# Patient Record
Sex: Female | Born: 1970 | Race: White | Hispanic: No | Smoking: Never smoker
Health system: Southern US, Community
[De-identification: ages and names within clinical notes are randomized; demographics above are authoritative.]

## PROBLEM LIST (undated history)

## (undated) DIAGNOSIS — R51 Headache: Secondary | ICD-10-CM

## (undated) DIAGNOSIS — I871 Compression of vein: Secondary | ICD-10-CM

## (undated) DIAGNOSIS — M5481 Occipital neuralgia: Secondary | ICD-10-CM

## (undated) DIAGNOSIS — E039 Hypothyroidism, unspecified: Secondary | ICD-10-CM

## (undated) DIAGNOSIS — G43909 Migraine, unspecified, not intractable, without status migrainosus: Secondary | ICD-10-CM

## (undated) HISTORY — PX: TUBAL LIGATION: SHX77

## (undated) HISTORY — DX: Compression of vein: I87.1

## (undated) HISTORY — PX: OTHER SURGICAL HISTORY: SHX169

---

## 1999-09-25 ENCOUNTER — Emergency Department (HOSPITAL_COMMUNITY): Admission: EM | Admit: 1999-09-25 | Discharge: 1999-09-25 | Payer: Self-pay | Admitting: Emergency Medicine

## 2000-09-19 ENCOUNTER — Emergency Department (HOSPITAL_COMMUNITY): Admission: EM | Admit: 2000-09-19 | Discharge: 2000-09-19 | Payer: Self-pay | Admitting: Emergency Medicine

## 2002-10-29 ENCOUNTER — Other Ambulatory Visit: Admission: RE | Admit: 2002-10-29 | Discharge: 2002-10-29 | Payer: Self-pay | Admitting: Obstetrics and Gynecology

## 2003-01-12 ENCOUNTER — Ambulatory Visit (HOSPITAL_COMMUNITY): Admission: RE | Admit: 2003-01-12 | Discharge: 2003-01-12 | Payer: Self-pay | Admitting: Obstetrics and Gynecology

## 2003-08-17 ENCOUNTER — Emergency Department (HOSPITAL_COMMUNITY): Admission: EM | Admit: 2003-08-17 | Discharge: 2003-08-17 | Payer: Self-pay | Admitting: Family Medicine

## 2004-05-05 ENCOUNTER — Emergency Department (HOSPITAL_COMMUNITY): Admission: EM | Admit: 2004-05-05 | Discharge: 2004-05-05 | Payer: Self-pay | Admitting: Family Medicine

## 2004-05-10 ENCOUNTER — Emergency Department (HOSPITAL_COMMUNITY): Admission: EM | Admit: 2004-05-10 | Discharge: 2004-05-10 | Payer: Self-pay | Admitting: Emergency Medicine

## 2004-07-06 ENCOUNTER — Other Ambulatory Visit: Admission: RE | Admit: 2004-07-06 | Discharge: 2004-07-06 | Payer: Self-pay | Admitting: Gynecology

## 2004-08-11 ENCOUNTER — Emergency Department (HOSPITAL_COMMUNITY): Admission: EM | Admit: 2004-08-11 | Discharge: 2004-08-11 | Payer: Self-pay | Admitting: Family Medicine

## 2004-09-01 ENCOUNTER — Emergency Department (HOSPITAL_COMMUNITY): Admission: EM | Admit: 2004-09-01 | Discharge: 2004-09-01 | Payer: Self-pay | Admitting: Family Medicine

## 2005-01-05 ENCOUNTER — Encounter (INDEPENDENT_AMBULATORY_CARE_PROVIDER_SITE_OTHER): Payer: Self-pay | Admitting: Specialist

## 2005-01-05 ENCOUNTER — Ambulatory Visit (HOSPITAL_BASED_OUTPATIENT_CLINIC_OR_DEPARTMENT_OTHER): Admission: RE | Admit: 2005-01-05 | Discharge: 2005-01-05 | Payer: Self-pay | Admitting: Plastic Surgery

## 2006-03-28 ENCOUNTER — Emergency Department (HOSPITAL_COMMUNITY): Admission: EM | Admit: 2006-03-28 | Discharge: 2006-03-28 | Payer: Self-pay | Admitting: Family Medicine

## 2006-04-03 ENCOUNTER — Emergency Department (HOSPITAL_COMMUNITY): Admission: EM | Admit: 2006-04-03 | Discharge: 2006-04-03 | Payer: Self-pay | Admitting: Family Medicine

## 2006-09-09 ENCOUNTER — Emergency Department (HOSPITAL_COMMUNITY): Admission: EM | Admit: 2006-09-09 | Discharge: 2006-09-09 | Payer: Self-pay | Admitting: Family Medicine

## 2006-10-18 ENCOUNTER — Ambulatory Visit (HOSPITAL_COMMUNITY): Admission: RE | Admit: 2006-10-18 | Discharge: 2006-10-18 | Payer: Self-pay | Admitting: Pediatrics

## 2007-07-11 ENCOUNTER — Ambulatory Visit (HOSPITAL_COMMUNITY): Admission: RE | Admit: 2007-07-11 | Discharge: 2007-07-11 | Payer: Self-pay | Admitting: Obstetrics and Gynecology

## 2007-07-15 ENCOUNTER — Ambulatory Visit (HOSPITAL_COMMUNITY): Admission: RE | Admit: 2007-07-15 | Discharge: 2007-07-15 | Payer: Self-pay | Admitting: Obstetrics and Gynecology

## 2008-09-21 ENCOUNTER — Emergency Department (HOSPITAL_COMMUNITY): Admission: EM | Admit: 2008-09-21 | Discharge: 2008-09-21 | Payer: Self-pay | Admitting: Family Medicine

## 2009-03-07 ENCOUNTER — Emergency Department (HOSPITAL_COMMUNITY): Admission: EM | Admit: 2009-03-07 | Discharge: 2009-03-08 | Payer: Self-pay | Admitting: Emergency Medicine

## 2009-05-31 ENCOUNTER — Emergency Department (HOSPITAL_COMMUNITY): Admission: EM | Admit: 2009-05-31 | Discharge: 2009-05-31 | Payer: Self-pay | Admitting: Emergency Medicine

## 2010-01-29 ENCOUNTER — Encounter: Payer: Self-pay | Admitting: Obstetrics and Gynecology

## 2010-01-30 ENCOUNTER — Encounter: Payer: Self-pay | Admitting: Pediatrics

## 2010-01-31 ENCOUNTER — Encounter: Payer: Self-pay | Admitting: Obstetrics and Gynecology

## 2010-04-15 LAB — POCT RAPID STREP A (OFFICE): Streptococcus, Group A Screen (Direct): NEGATIVE

## 2010-05-27 NOTE — Op Note (Signed)
NAME:  Martha Aguilar, PRIGMORE NO.:  [id number]   MEDICAL RECORD NO.:  [id number]          PATIENT TYPE:  AMB   LOCATION:  DSC                          FACILITY:  [facility]   PHYSICIAN:  Consuello Bossier., M.D.DATE OF BIRTH:  May 25, 1970   DATE OF PROCEDURE:  01/05/2005  DATE OF DISCHARGE:                                 OPERATIVE REPORT   PREOPERATIVE DIAGNOSIS:  Recently biopsied superficial spreading malignant  melanoma, right medial calf area.   POSTOPERATIVE DIAGNOSIS:  Recently biopsied superficial spreading malignant melanoma, right medial  calf area.   OPERATION:  Wide excisional biopsy with 3 cm wound closure.   SURGEON:  Pleas Patricia, M.D.   ANESTHESIA:  Xylocaine 2% with 1:100,000 epinephrine.   FINDINGS:  The patient had a recent shave biopsy of a superficial malignant  melanoma over her right medial calf for which further wider excisional  biopsy had been advocated.  This was done and the wound closed in layers.   DESCRIPTION OF PROCEDURE:  The patient was brought to the operating room,  marked off with a planned elliptical lesion about the scabbed area from a  recent shave biopsy.  She was prepped with Betadine and iodine and draped  sterilely and anesthetized with Xylocaine 2% with 1:100,000 epinephrine.  An  axial shaped excisional biopsy, which was full thickness including some of  the underlying subcutaneous tissues, was performed.  Bleeding was controlled  with light cautery unit.  The wound was then closed with interrupted  subcutaneous 4-0 Monocryl followed by interrupted and running 5-0 Prolene to  the skin.  Neosporin ointment, 4 by 8s, and an Ace bandage were then  applied.  The patient tolerated the procedure well and was able to be  discharged from the operating room to be followed by me as an outpatient.      Consuello Bossier., M.D.  Electronically Signed     HH/MEDQ  D:  01/05/2005  T:  01/05/2005  Job:  [id number]

## 2010-06-27 ENCOUNTER — Other Ambulatory Visit: Payer: Self-pay | Admitting: Plastic Surgery

## 2011-10-19 ENCOUNTER — Encounter (HOSPITAL_COMMUNITY): Payer: Self-pay | Admitting: *Deleted

## 2011-10-19 ENCOUNTER — Observation Stay (HOSPITAL_COMMUNITY)
Admission: AD | Admit: 2011-10-19 | Discharge: 2011-10-21 | Disposition: A | Discharge status: Home / Self Care | Payer: Managed Care, Other (non HMO) | Source: Ambulatory Visit | Attending: Family Medicine | Admitting: Family Medicine

## 2011-10-19 DIAGNOSIS — Z87440 Personal history of urinary (tract) infections: Secondary | ICD-10-CM | POA: Insufficient documentation

## 2011-10-19 DIAGNOSIS — R109 Unspecified abdominal pain: Secondary | ICD-10-CM | POA: Insufficient documentation

## 2011-10-19 DIAGNOSIS — R102 Pelvic and perineal pain: Secondary | ICD-10-CM | POA: Diagnosis present

## 2011-10-19 DIAGNOSIS — G43909 Migraine, unspecified, not intractable, without status migrainosus: Secondary | ICD-10-CM | POA: Insufficient documentation

## 2011-10-19 DIAGNOSIS — E860 Dehydration: Secondary | ICD-10-CM | POA: Diagnosis present

## 2011-10-19 DIAGNOSIS — R112 Nausea with vomiting, unspecified: Secondary | ICD-10-CM | POA: Diagnosis present

## 2011-10-19 DIAGNOSIS — E039 Hypothyroidism, unspecified: Secondary | ICD-10-CM | POA: Diagnosis present

## 2011-10-19 DIAGNOSIS — G43709 Chronic migraine without aura, not intractable, without status migrainosus: Secondary | ICD-10-CM | POA: Insufficient documentation

## 2011-10-19 DIAGNOSIS — Z79899 Other long term (current) drug therapy: Secondary | ICD-10-CM | POA: Insufficient documentation

## 2011-10-19 DIAGNOSIS — N390 Urinary tract infection, site not specified: Principal | ICD-10-CM | POA: Diagnosis present

## 2011-10-19 DIAGNOSIS — N179 Acute kidney failure, unspecified: Secondary | ICD-10-CM | POA: Diagnosis present

## 2011-10-19 DIAGNOSIS — R1024 Suprapubic pain: Secondary | ICD-10-CM | POA: Diagnosis present

## 2011-10-19 HISTORY — DX: Hypothyroidism, unspecified: E03.9

## 2011-10-19 HISTORY — DX: Dehydration: E86.0

## 2011-10-19 HISTORY — DX: Headache: R51

## 2011-10-19 LAB — COMPREHENSIVE METABOLIC PANEL
ALT: 8 U/L (ref 0–35)
AST: 10 U/L (ref 0–37)
Alkaline Phosphatase: 51 U/L (ref 39–117)
CO2: 28 mEq/L (ref 19–32)
Chloride: 102 mEq/L (ref 96–112)
Creatinine, Ser: 3.95 mg/dL — ABNORMAL HIGH (ref 0.50–1.10)
GFR calc non Af Amer: 13 mL/min — ABNORMAL LOW (ref >90)
Potassium: 4 mEq/L (ref 3.5–5.1)
Total Bilirubin: 0.3 mg/dL (ref 0.3–1.2)

## 2011-10-19 LAB — URINE MICROSCOPIC-ADD ON

## 2011-10-19 LAB — DIFFERENTIAL
Eosinophils Relative: 1 % (ref 0–5)
Lymphocytes Relative: 17 % (ref 12–46)
Lymphs Abs: 1.3 10*3/uL (ref 0.7–4.0)
Monocytes Absolute: 0.9 10*3/uL (ref 0.1–1.0)
Monocytes Relative: 12 % (ref 3–12)
Neutro Abs: 5.5 10*3/uL (ref 1.7–7.7)

## 2011-10-19 LAB — URINALYSIS, ROUTINE W REFLEX MICROSCOPIC
Bilirubin Urine: NEGATIVE
Glucose, UA: NEGATIVE mg/dL
Ketones, ur: NEGATIVE mg/dL
Specific Gravity, Urine: 1.009 (ref 1.005–1.030)
pH: 5 (ref 5.0–8.0)

## 2011-10-19 LAB — CBC
MCV: 92.9 fL (ref 78.0–100.0)
Platelets: 233 10*3/uL (ref 150–400)
RBC: 4.09 MIL/uL (ref 3.87–5.11)
WBC: 7.8 10*3/uL (ref 4.0–10.5)

## 2011-10-19 LAB — PHOSPHORUS: Phosphorus: 3.6 mg/dL (ref 2.3–4.6)

## 2011-10-19 MED ORDER — ZOLPIDEM TARTRATE 5 MG PO TABS
5.0000 mg | ORAL_TABLET | Freq: Once | ORAL | Status: AC
  Administered 2011-10-19: 5 mg via ORAL
  Filled 2011-10-19: qty 1

## 2011-10-19 MED ORDER — PHENAZOPYRIDINE HCL 100 MG PO TABS
100.0000 mg | ORAL_TABLET | Freq: Three times a day (TID) | ORAL | Status: DC
  Filled 2011-10-19: qty 1

## 2011-10-19 MED ORDER — SODIUM CHLORIDE 0.9 % IV SOLN
INTRAVENOUS | Status: AC
  Administered 2011-10-19: 19:00:00 via INTRAVENOUS

## 2011-10-19 MED ORDER — ONDANSETRON HCL 4 MG/2ML IJ SOLN: 4.0000 mg | Freq: Four times a day (QID) | INTRAMUSCULAR | Status: DC | PRN

## 2011-10-19 MED ORDER — ENOXAPARIN SODIUM 40 MG/0.4ML ~~LOC~~ SOLN
40.0000 mg | SUBCUTANEOUS | Status: DC
  Filled 2011-10-19 (×2): qty 0.4

## 2011-10-19 MED ORDER — OXYCODONE HCL 5 MG PO TABS
5.0000 mg | ORAL_TABLET | Freq: Four times a day (QID) | ORAL | Status: DC | PRN
  Administered 2011-10-19 – 2011-10-21 (×4): 5 mg via ORAL
  Filled 2011-10-19 (×4): qty 1

## 2011-10-19 MED ORDER — ONDANSETRON HCL 4 MG PO TABS: 4.0000 mg | ORAL_TABLET | Freq: Four times a day (QID) | ORAL | Status: DC | PRN

## 2011-10-19 MED ORDER — LEVOTHYROXINE SODIUM 100 MCG PO TABS
100.0000 ug | ORAL_TABLET | Freq: Every day | ORAL | Status: DC
  Administered 2011-10-20 – 2011-10-21 (×2): 100 ug via ORAL
  Filled 2011-10-19 (×4): qty 1

## 2011-10-19 MED ORDER — DEXTROSE 5 % IV SOLN
2.0000 g | INTRAVENOUS | Status: DC
  Administered 2011-10-19 – 2011-10-20 (×2): 2 g via INTRAVENOUS
  Filled 2011-10-19 (×3): qty 2

## 2011-10-19 MED ORDER — PHENAZOPYRIDINE HCL 100 MG PO TABS
100.0000 mg | ORAL_TABLET | Freq: Three times a day (TID) | ORAL | Status: DC
  Administered 2011-10-19 – 2011-10-21 (×6): 100 mg via ORAL
  Filled 2011-10-19 (×11): qty 1

## 2011-10-19 MED ORDER — ACETAMINOPHEN 325 MG PO TABS
650.0000 mg | ORAL_TABLET | Freq: Four times a day (QID) | ORAL | Status: DC | PRN
  Filled 2011-10-19: qty 2

## 2011-10-19 MED ORDER — ACETAMINOPHEN 650 MG RE SUPP: 650.0000 mg | Freq: Four times a day (QID) | RECTAL | Status: DC | PRN

## 2011-10-19 NOTE — H&P (Signed)
[facility] History and Physical  Martha Aguilar:[id number] DOB: Feb 27, 1970 DOA: 10/19/2011  Referring physician: Claude Manges  PCP: Claude Manges, FNP   Chief Complaint: Abdominal pain, nausea/vomiting, unable to keep things down.  HPI: Martha Aguilar is a 41 y.o. female with a past medical history significant for hypothyroidism, migraines and history of recurrent UTIs; admitted from the patient's primary care physician office secondary to ongoing abdominal pain (suprapubic region), dysuria, low-grade fever/chills, nausea/vomiting and inability to keep things down. Patient was diagnosed about 4-5 days ago with what appeared to be at that moment a non-complicated UTI, initially started on antibiotics by mouth without successful clearance or improvement of her symptoms. At this moment patient was admitted for fluid resuscitation since she is dehydrated and to provide further treatment for pyelonephritis. Patient denies chest pain, hematemesis, melena, hematuria, headaches, blurred vision or any other acute complaints.   Review of Systems:  Negative except as otherwise mentioned on history of present illness.  Past Medical History  Diagnosis Date  . Headache     migraines  . Hypothyroidism    Past Surgical History  Procedure Date  . Uterine polyps    Social History:  reports that she has never smoked. She has never used smokeless tobacco. She reports that she drinks alcohol. She reports that she does not use illicit drugs. patient admitted from home, at this moment do not require any assistance with activities of daily living.  Allergies: No known allergic reactions to any medications.  Family history: Reviewed with patient and mother at bedside no significant past medical history reported.  Prior to Admission medications   Not on File   Physical Exam:   General:  No acute distress, afebrile, cooperative to examination.  Eyes: No icterus, PERRLA, extra ocular  muscles intact; no nystagmus  ENT: Dry mucous membranes, no erythema or exudate inside patient's mouth, no discharges out of her nostrils or ears  Neck: No thyromegaly, no JVD, no bruits; neck was supple on exam  Cardiovascular: S1 and S2 appreciated, no murmur, no gallops or rubs.  Respiratory: Clear to auscultation bilaterally  Abdomen: Soft, positive bowel sounds, no guarding, mild to moderate suprapubic discomfort aspirin across lower abdomen with deep palpation.  Skin: No rash or petechiae, no open wounds.  Musculoskeletal: No joint swelling or erythema, full range of motion.  Psychiatric: Stable, appropriate, no suicidal ideation or hallucinations.  Neurologic: Cranial nerves grossly intact, patient alert, awake and oriented x3, no focal motor or sensory deficit at present on exam; normal finger to nose.  Labs on Admission:  Labwork order but pending at the moment of the dictation.  Assessment/Plan 1-pyelonephritis: Patient with symptoms of low-grade fever, lower back pain, dysuria, nausea/vomiting and inability to keep things down most likely secondary to UTI. At this point she has failed antibiotic therapy as an outpatient and is unable to keep things down for further treatment.  -Will admit the patient to regular floor, provide supportive care and fluid resuscitation -repeat urine culture -Will use empiric Rocephin and also the use of Pyridium for dysuria -When necessary antiemetics and also antipyretics.   2-Nausea & vomiting: Most likely as a sitter with problem #1. Will provide fluid resuscitation and as mentioned above antiemetics as needed.   3-Suprapubic abdominal pain: As a sitter with problem #1. Will provide when necessary pain medications. Empirical treatment for Rocephin to be initiated once urine culture has been taken.  4-Dehydration: Secondary to decrease by mouth intake do to ongoing nausea and vomiting,  we'll provide IV fluid  resuscitation.  5-Hypothyroidism:  We'll check TSH and continue Synthroid. Depending TSH levels Synthroid will be adjusted.   6-Migraines: Stable and not present at this point. Plan is to continue pristiq also PRN Imitrex. Pharmacy to review and reconcile patient's home doses in order for [name] to continue same regimen.  7-Recurrent UTI: Second episode of UTI in the last 6 months. Patient at this point he might qualify for suppressive therapy once his acute treatment is finished.   DVT: Lovenox.   Code Status: Full Family Communication: patient and mother at bedside Disposition Plan: home when medically stable; hopefully in 1-2 days  Time spent: >30 minutes  Rondal Vandevelde [facility] Pager [phone]  If 7PM-7AM, please contact night-coverage [email or web address] Password [email or web address] 10/19/2011, 6:25 PM

## 2011-10-20 DIAGNOSIS — N179 Acute kidney failure, unspecified: Secondary | ICD-10-CM | POA: Diagnosis present

## 2011-10-20 LAB — CBC
MCH: 30.9 pg (ref 26.0–34.0)
MCV: 93.3 fL (ref 78.0–100.0)
Platelets: 223 10*3/uL (ref 150–400)
RBC: 3.75 MIL/uL — ABNORMAL LOW (ref 3.87–5.11)
RDW: 11.8 % (ref 11.5–15.5)
WBC: 9.6 10*3/uL (ref 4.0–10.5)

## 2011-10-20 LAB — BASIC METABOLIC PANEL
CO2: 24 mEq/L (ref 19–32)
Calcium: 7.9 mg/dL — ABNORMAL LOW (ref 8.4–10.5)
Creatinine, Ser: 3.27 mg/dL — ABNORMAL HIGH (ref 0.50–1.10)
GFR calc non Af Amer: 16 mL/min — ABNORMAL LOW (ref >90)
Glucose, Bld: 120 mg/dL — ABNORMAL HIGH (ref 70–99)
Sodium: 137 mEq/L (ref 135–145)

## 2011-10-20 MED ORDER — SODIUM CHLORIDE 0.9 % IV SOLN
INTRAVENOUS | Status: DC
  Administered 2011-10-20 – 2011-10-21 (×3): via INTRAVENOUS

## 2011-10-20 MED ORDER — VENLAFAXINE HCL ER 150 MG PO CP24
150.0000 mg | ORAL_CAPSULE | Freq: Every day | ORAL | Status: DC
  Administered 2011-10-20: 150 mg via ORAL
  Filled 2011-10-20 (×2): qty 1

## 2011-10-20 MED ORDER — MORPHINE SULFATE 2 MG/ML IJ SOLN
INTRAMUSCULAR | Status: AC
  Filled 2011-10-20: qty 1

## 2011-10-20 MED ORDER — MORPHINE SULFATE 2 MG/ML IJ SOLN
1.0000 mg | INTRAMUSCULAR | Status: DC | PRN
  Administered 2011-10-20: 1 mg via INTRAVENOUS

## 2011-10-20 MED ORDER — HYDROMORPHONE HCL PF 1 MG/ML IJ SOLN
1.0000 mg | INTRAMUSCULAR | Status: DC | PRN
  Administered 2011-10-20 – 2011-10-21 (×6): 1 mg via INTRAVENOUS
  Filled 2011-10-20 (×6): qty 1

## 2011-10-20 MED ORDER — ENOXAPARIN SODIUM 30 MG/0.3ML ~~LOC~~ SOLN
30.0000 mg | SUBCUTANEOUS | Status: DC
  Administered 2011-10-20: 30 mg via SUBCUTANEOUS
  Filled 2011-10-20 (×2): qty 0.3

## 2011-10-20 NOTE — Progress Notes (Signed)
Utilization review completed.  

## 2011-10-20 NOTE — Progress Notes (Signed)
[facility] PROGRESS NOTE  MANVIR THORSON WUX:[id number] DOB: May 17, 1970 DOA: 10/19/2011 PCP: No primary provider on file.  Brief narrative: 41 year old female with history of hypothyroidism, migraine headaches and recurrent UTI's presented with suprapubic abdominal pain associated with fevers and dysuria.  Assessment and Plan:  Principal Problem:  *Suprapubic abdominal pain, nausea and vomiting  Secondary to UTI  We will continue Rocephin   Follow up urine culture results  Continue supportive care with NS @ 125 cc/hr  Continue zofran prn nausea and/or vomiting  Active Problems: Acute kidney injury  Secondary to UTI  No previous renal function values available for comparison   Creatinine slowly trending down  Continue IV fluids  [facility] [phone]  Consultants:  None   Procedures/Studies:  No results found.  Antibiotics:  Rocephin 10/19/2011 -->  Code Status: Full Family Communication: Pt at bedside Disposition Plan: Home when medically stable  HPI/Subjective: No events overnight.   Objective: Filed Vitals:   10/19/11 1900 10/19/11 2050 10/19/11 2122 10/20/11 0531  BP:   127/76 93/54  Pulse:   77 77  Temp:   97.7 F (36.5 C) 97.9 F (36.6 C)  TempSrc:   Oral Oral  Resp:   18 18  Height: 5\' 6"  (1.676 m) 5\' 6"  (1.676 m)    Weight: 63.504 kg (140 lb) 63.504 kg (140 lb)    SpO2:   98% 95%    Intake/Output Summary (Last 24 hours) at 10/20/11 0941 Last data filed at 10/20/11 0700  Gross per 24 hour  Intake 1831.67 ml  Output    850 ml  Net 981.67 ml    Exam:   General:  Pt is alert, follows commands appropriately, not in acute distress  Cardiovascular: Regular rate and rhythm, S1/S2, no murmurs, no rubs, no gallops  Respiratory: Clear to auscultation bilaterally, no wheezing, no crackles, no rhonchi  Abdomen: Soft, non tender, non distended, bowel sounds present, no guarding  Extremities: No edema, pulses DP and PT  palpable bilaterally  Neuro: Grossly nonfocal  Data Reviewed: Basic Metabolic Panel:  Lab 10/20/11 0347 10/19/11 1904  NA 137 138  K 4.0 4.0  CL 105 102  CO2 24 28  GLUCOSE 120* 88  BUN 31* 34*  CREATININE 3.27* 3.95*  CALCIUM 7.9* 8.9  MG -- 1.9  PHOS -- 3.6   Liver Function Tests:  Lab 10/19/11 1904  AST 10  ALT 8  ALKPHOS 51  BILITOT 0.3  PROT 6.4  ALBUMIN 3.4*   CBC:  Lab 10/20/11 0440 10/19/11 1904  WBC 9.6 7.8  HGB 11.6* 13.0  HCT 35.0* 38.0  MCV 93.3 92.9  PLT 223 233    Scheduled Meds:   . cefTRIAXone   2 g Intravenous Q24H  . enoxaparin (LOVENOX)   30 mg Subcutaneous Q24H  . levothyroxine  100 mcg Oral QAC breakfast  . phenazopyridine  100 mg Oral TID WC  . zolpidem  5 mg Oral Once   Continuous Infusions:   . sodium chloride 125 mL/hr at 10/19/11 1840     Debbora Presto, MD  [facility] Pager [phone]  If 7PM-7AM, please contact night-coverage [email or web address] Password [email or web address] 10/20/2011, 9:41 AM   LOS: 1 day

## 2011-10-20 NOTE — Progress Notes (Signed)
INITIAL ADULT NUTRITION ASSESSMENT Date: 10/20/2011   Time: 3:06 PM Reason for Assessment: Nutrition risk   INTERVENTION: Anti-emetics per MD. Will monitor.   ASSESSMENT: Female 41 y.o.  Dx: Suprapubic abdominal pain   Food/Nutrition Related Hx: Pt recently diagnosed with a UTI 4-5 days ago and admitted with abdominal pain, nausea/vomiting, and unable to keep things down. Attempted to meet with pt, however pt stated she did not want to talk right now, but was able state that her nausea was improving. Pt reported 10 pound unintended weight loss since June of this year and poor appetite on admission screen.   Hx:  Past Medical History  Diagnosis Date  . Headache     migraines  . Hypothyroidism    Related Meds:  Scheduled Meds:   . cefTRIAXone (ROCEPHIN)  IV  2 g Intravenous Q24H  . enoxaparin (LOVENOX) injection  30 mg Subcutaneous Q24H  . levothyroxine  100 mcg Oral QAC breakfast  . morphine      . phenazopyridine  100 mg Oral TID WC  . zolpidem  5 mg Oral Once  . DISCONTD: enoxaparin (LOVENOX) injection  40 mg Subcutaneous Q24H  . DISCONTD: phenazopyridine  100 mg Oral TID WC   Continuous Infusions:   . sodium chloride 125 mL/hr at 10/19/11 1840  . sodium chloride 125 mL/hr at 10/20/11 1106   PRN Meds:.acetaminophen, acetaminophen, HYDROmorphone (DILAUDID) injection, morphine injection, ondansetron (ZOFRAN) IV, ondansetron, oxyCODONE  Ht: 5\' 6"  (167.6 cm)  Wt: 140 lb (63.504 kg)  Ideal Wt: 130 lb % Ideal Wt: 107  Usual Wt: 150 lb in June 2013 per pt report % Usual Wt: 93   Body mass index is 22.60 kg/(m^2).  Labs:  CMP     Component Value Date/Time   NA 137 10/20/2011 0440   K 4.0 10/20/2011 0440   CL 105 10/20/2011 0440   CO2 24 10/20/2011 0440   GLUCOSE 120* 10/20/2011 0440   BUN 31* 10/20/2011 0440   CREATININE 3.27* 10/20/2011 0440   CALCIUM 7.9* 10/20/2011 0440   PROT 6.4 10/19/2011 1904   ALBUMIN 3.4* 10/19/2011 1904   AST 10 10/19/2011 1904     ALT 8 10/19/2011 1904   ALKPHOS 51 10/19/2011 1904   BILITOT 0.3 10/19/2011 1904   GFRNONAA 16* 10/20/2011 0440   GFRAA 19* 10/20/2011 0440    Intake/Output Summary (Last 24 hours) at 10/20/11 1514 Last data filed at 10/20/11 1300  Gross per 24 hour  Intake 2071.67 ml  Output   1350 ml  Net 721.67 ml   Last BM - 10/9  Diet Order: General   IVF:    sodium chloride Last Rate: 125 mL/hr at 10/19/11 1840  sodium chloride Last Rate: 125 mL/hr at 10/20/11 1106    Estimated Nutritional Needs:   Kcal:1650-1900 Protein:65-75g Fluid:1.6-1.9L  NUTRITION DIAGNOSIS: -Predicted suboptimal energy intake (NI-1.6).  Status: Ongoing  RELATED TO: nausea/abdominal pain  AS EVIDENCE BY: H&P  MONITORING/EVALUATION(Goals): 1. Resolution of nausea/abdominal pain 2. Pt to consume >90% of meals  EDUCATION NEEDS: -No education needs identified at this time   Dietitian [phone]  DOCUMENTATION CODES Per approved criteria  -Not Applicable    Marshall Cork 10/20/2011, 3:06 PM

## 2011-10-21 ENCOUNTER — Observation Stay (HOSPITAL_COMMUNITY): Payer: Managed Care, Other (non HMO)

## 2011-10-21 DIAGNOSIS — R109 Unspecified abdominal pain: Secondary | ICD-10-CM

## 2011-10-21 DIAGNOSIS — N179 Acute kidney failure, unspecified: Secondary | ICD-10-CM

## 2011-10-21 LAB — CBC
HCT: 35.2 % — ABNORMAL LOW (ref 36.0–46.0)
Hemoglobin: 11.7 g/dL — ABNORMAL LOW (ref 12.0–15.0)
MCH: 31 pg (ref 26.0–34.0)
MCHC: 33.2 g/dL (ref 30.0–36.0)
RDW: 11.8 % (ref 11.5–15.5)

## 2011-10-21 LAB — URINE CULTURE

## 2011-10-21 LAB — BASIC METABOLIC PANEL
BUN: 17 mg/dL (ref 6–23)
Calcium: 8.1 mg/dL — ABNORMAL LOW (ref 8.4–10.5)
Creatinine, Ser: 1.89 mg/dL — ABNORMAL HIGH (ref 0.50–1.10)
GFR calc Af Amer: 36 mL/min — ABNORMAL LOW (ref >90)
GFR calc non Af Amer: 31 mL/min — ABNORMAL LOW (ref >90)
Glucose, Bld: 98 mg/dL (ref 70–99)

## 2011-10-21 MED ORDER — HYDROCODONE-ACETAMINOPHEN 5-500 MG PO TABS: 1.0000 | ORAL_TABLET | Freq: Four times a day (QID) | ORAL | Status: DC | PRN

## 2011-10-21 MED ORDER — LORAZEPAM 2 MG/ML IJ SOLN
INTRAMUSCULAR | Status: AC
  Filled 2011-10-21: qty 1

## 2011-10-21 NOTE — Progress Notes (Signed)
Per pt. Request we perform bladder scan; which shows ~224ml of urine in bladder.  Pt. C/o persistent feeling of her "bladder feels real full; and it takes a long time to empty whenever I void".  She requests, and we perform I & O cath., which returns of orange (she is on Pyridium) urine.  She thanks Martha Aguilar for our care.

## 2011-10-21 NOTE — Discharge Summary (Signed)
Physician Discharge Summary  DONNARAE SATTERFIELD ZOX:[id number] DOB: [birth year] DOA: 10/19/2011  PCP: No primary provider on file.  Admit date: 10/19/2011 Discharge date: 10/21/2011  Recommendations for Outpatient Follow-up:  1. Follow up BMP in one week  Discharge Diagnoses:  Principal Problem:  *Suprapubic abdominal pain Active Problems:  Nausea & vomiting  Dehydration  Hypothyroidism  Migraines  Acute kidney injury   Discharge Condition: Stable  Diet recommendation: regular diet  Filed Weights   10/19/11 1900 10/19/11 2050  Weight: 63.504 kg (140 lb) 63.504 kg (140 lb)    History of present illness:  Martha Aguilar is a 41 y.o. female with a past medical history significant for hypothyroidism, migraines and history of recurrent UTIs; admitted from the patient's primary care physician office secondary to ongoing abdominal pain (suprapubic region), dysuria, low-grade fever/chills, nausea/vomiting and inability to keep things down. Patient was diagnosed about 4-5 days ago with what appeared to be at that moment a non-complicated UTI, initially started on antibiotics by mouth without successful clearance or improvement of her symptoms. At this moment patient was admitted for fluid resuscitation since she is dehydrated and to provide further treatment for pyelonephritis. Patient denies chest pain, hematemesis, melena, hematuria, headaches, blurred vision or any other acute complaints   Hospital Course:   UTI Patient has h/o recurrent UTI's, and she was started on rocephin empirically. The urine culture showed no growth. At this time will discharge home. She does not require antibiotics. Patient had normal WBC during the hospital stay.  AKI Patient had Cr 3.95 on the day of admission, she was started on iv fluids and at this time the Cr is down to 1.89. She was taking NSAIDs at home for migraines, and i have stopped them . I have also advised her not to take NSAIDs. Renal ultrasound  showed no hydronephrosis.she will need repeat BMP in one week. And if cr is still elevated she will need nephrology follow up.  Migraines Continue sumatriptan  Dehydration:  Resolved after IV fluids.  Nausea/Vomiting Resolved, patient eating without any problems.    Procedures:  Renal Ultrasound  IMPRESSION:  Normal size in appearance of both kidneys. No evidence of  hydronephrosis.  Consultations:  None  Discharge Exam: Filed Vitals:   10/20/11 1413 10/20/11 2200 10/21/11 0619 10/21/11 1502  BP: 104/56 122/85 97/63 128/67  Pulse: 73 73 73 75  Temp: 97.6 F (36.4 C) 98.2 F (36.8 C) 97.9 F (36.6 C) 97.5 F (36.4 C)  TempSrc: Oral Oral Oral Oral  Resp: 20 16 18 18   Height:      Weight:      SpO2: 100% 96% 94% 98%    General: Appears in no acute distress Cardiovascular: S1S2 RRR Respiratory: clear to auscultation bilaterally Abdomen: Soft, nontender to palpation  Discharge Instructions  Discharge Orders    Future Orders Please Complete By Expires   Diet - low sodium heart healthy      Increase activity slowly          Medication List     As of 10/21/2011  4:39 PM    STOP taking these medications         ibuprofen 200 MG tablet   Commonly known as: ADVIL,MOTRIN      levofloxacin 500 MG tablet   Commonly known as: LEVAQUIN      TAKE these medications         desvenlafaxine 100 MG 24 hr tablet   Commonly known as: PRISTIQ   Take  100 mg by mouth daily.      fish oil-omega-3 fatty acids 1000 MG capsule   Take 1 g by mouth daily.      HYDROcodone-acetaminophen 5-500 MG per tablet   Commonly known as: VICODIN   Take 1 tablet by mouth every 6 (six) hours as needed for pain.      levothyroxine 175 MCG tablet   Commonly known as: SYNTHROID, LEVOTHROID   Take 175 mcg by mouth daily.      SUMAtriptan 100 MG tablet   Commonly known as: IMITREX   Take 100 mg by mouth every 2 (two) hours as needed. For migraines.      SUMAtriptan 6 MG/0.5ML  Soln injection   Commonly known as: IMITREX   Inject 6 mg into the skin every 2 (two) hours as needed. For migraines.      vitamin C 500 MG tablet   Commonly known as: ASCORBIC ACID   Take 500 mg by mouth daily.      zolpidem 10 MG tablet   Commonly known as: AMBIEN   Take 10 mg by mouth at bedtime as needed. For sleep.          The results of significant diagnostics from this hospitalization (including imaging, microbiology, ancillary and laboratory) are listed below for reference.    Significant Diagnostic Studies: US Renal  10/21/2011  *RADIOLOGY REPORT*  Clinical Data:  Acute renal failure.  Nausea and vomiting.  RENAL/URINARY TRACT ULTRASOUND COMPLETE  C  IMPRESSION: Normal size in appearance of both kidneys.  No evidence of hydronephrosis.   Original Report Authenticated By: Danae Orleans, M.D.     Microbiology: Recent Results (from the past 240 hour(s))  URINE CULTURE     Status: Normal   Collection Time   10/19/11  6:32 PM      Component Value Range Status Comment   Specimen Description URINE, CLEAN CATCH   Final    Special Requests NONE   Final    Culture  Setup Time 10/20/2011 05:22   Final    Colony Count MG   Final    Culture NO GROWTH   Final    Report Status 10/21/2011 FINAL   Final      Labs: Basic Metabolic Panel:  Lab 10/21/11 4098 10/20/11 0440 10/19/11 1904  NA 140 137 138  K 3.8 4.0 4.0  CL 107 105 102  CO2 26 24 28   GLUCOSE 98 120* 88  BUN 17 31* 34*  CREATININE 1.89* 3.27* 3.95*  CALCIUM 8.1* 7.9* 8.9  MG -- -- 1.9  PHOS -- -- 3.6   Liver Function Tests:  Lab 10/19/11 1904  AST 10  ALT 8  ALKPHOS 51  BILITOT 0.3  PROT 6.4  ALBUMIN 3.4*   No results found for this basename: LIPASE:5,AMYLASE:5 in the last 168 hours No results found for this basename: AMMONIA:5 in the last 168 hours CBC:  Lab 10/21/11 0529 10/20/11 0440 10/19/11 1904  WBC 7.9 9.6 7.8  NEUTROABS -- -- 5.5  HGB 11.7* 11.6* 13.0  HCT 35.2* 35.0* 38.0  MCV 93.1  93.3 92.9  PLT 213 223 233    Time coordinating discharge: 45 mines  Signed:  LAMA,GAGAN S  [facility] 10/21/2011, 4:39 PM

## 2011-10-21 NOTE — Progress Notes (Signed)
She had attempted unsuccessfully to void ~30 min. Prior to d/c, about which she was very concerned.  However, she has just successfully voided, about which she was pleased and is confident to go home.  She verbalizes understanding of her d/c instructions, including the need to see her PCP in 1 week to have repeat blood work to check her renal function (she is a Engineer, civil (consulting)).  After receiving prescriptions and instructions, for which she signs, she is taken to her car without incident.  She chooses not to access her electronic chart; and I reiterate that she has several days to complete this if she wishes at home.

## 2011-10-21 NOTE — Progress Notes (Signed)
[facility] PROGRESS NOTE  Martha Aguilar:[id number] DOB: 1970/05/26 DOA: 10/19/2011 PCP: No primary provider on file.  Brief narrative: 41 year old female with history of hypothyroidism, migraine headaches and recurrent UTI's presented with suprapubic abdominal pain associated with fevers and dysuria.  Assessment and Plan:  Principal Problem:  *Suprapubic abdominal pain, nausea and vomiting  Secondary to UTI  We will continue Rocephin   Urine culture is negative so far  Continue supportive care with NS @ 100 ml/hr  Continue zofran prn nausea and/or vomiting  Active Problems: Acute kidney injury  Secondary to UTI  No previous renal function values available for comparison   Creatinine slowly trending down  Continue IV fluids  Will get renal ultrasound  1  Consultants:  None   Procedures/Studies: No results found.  Antibiotics:  Rocephin 10/19/2011 -->  Code Status: Full Family Communication: Pt at bedside Disposition Plan: Home when medically stable  HPI/Subjective: Patient says she still has some nausea and gets sharp suprapubic pain.denies vomiting.  Objective: Filed Vitals:   10/20/11 0531 10/20/11 1413 10/20/11 2200 10/21/11 0619  BP: 93/54 104/56 122/85 97/63  Pulse: 77 73 73 73  Temp: 97.9 F (36.6 C) 97.6 F (36.4 C) 98.2 F (36.8 C) 97.9 F (36.6 C)  TempSrc: Oral Oral Oral Oral  Resp: 18 20 16 18   Height:      Weight:      SpO2: 95% 100% 96% 94%    Intake/Output Summary (Last 24 hours) at 10/21/11 0949 Last data filed at 10/21/11 0600  Gross per 24 hour  Intake 2412.5 ml  Output    500 ml  Net 1912.5 ml    Exam:   General:  Pt is alert, follows commands appropriately, not in acute distress  Cardiovascular: Regular rate and rhythm, S1/S2, no murmurs, no rubs, no gallops  Respiratory: Clear to auscultation bilaterally, no wheezing, no crackles, no rhonchi  Abdomen: Soft, non tender, non distended, bowel  sounds present, no guarding  Extremities: No edema, pulses DP and PT palpable bilaterally  Neuro: Grossly nonfocal  Data Reviewed: Basic Metabolic Panel:  Lab 10/21/11 8119 10/20/11 0440 10/19/11 1904  NA 140 137 138  K 3.8 4.0 4.0  CL 107 105 102  CO2 26 24 28   GLUCOSE 98 120* 88  BUN 17 31* 34*  CREATININE 1.89* 3.27* 3.95*  CALCIUM 8.1* 7.9* 8.9  MG -- -- 1.9  PHOS -- -- 3.6   Liver Function Tests:  Lab 10/19/11 1904  AST 10  ALT 8  ALKPHOS 51  BILITOT 0.3  PROT 6.4  ALBUMIN 3.4*   CBC:  Lab 10/20/11 0440 10/19/11 1904  WBC 9.6 7.8  HGB 11.6* 13.0  HCT 35.0* 38.0  MCV 93.3 92.9  PLT 223 233    Scheduled Meds:   . cefTRIAXone   2 g Intravenous Q24H  . enoxaparin (LOVENOX)   30 mg Subcutaneous Q24H  . levothyroxine  100 mcg Oral QAC breakfast  . phenazopyridine  100 mg Oral TID WC  . zolpidem  5 mg Oral Once   Continuous Infusions:    . sodium chloride 125 mL/hr at 10/21/11 0510     Meredeth Ide, MD  [facility] Pager [phone]  If 7PM-7AM, please contact night-coverage [email or web address] Password [email or web address] 10/21/2011, 9:49 AM   LOS: 2 days

## 2011-10-31 ENCOUNTER — Ambulatory Visit
Admission: RE | Admit: 2011-10-31 | Discharge: 2011-10-31 | Disposition: A | Discharge status: Home / Self Care | Payer: Managed Care, Other (non HMO) | Source: Ambulatory Visit | Attending: Family Medicine | Admitting: Family Medicine

## 2011-10-31 ENCOUNTER — Other Ambulatory Visit: Payer: Self-pay | Admitting: Family Medicine

## 2011-10-31 DIAGNOSIS — M545 Low back pain, unspecified: Secondary | ICD-10-CM

## 2011-11-09 ENCOUNTER — Encounter (HOSPITAL_COMMUNITY): Payer: Self-pay

## 2012-04-01 ENCOUNTER — Other Ambulatory Visit: Payer: Self-pay | Admitting: Obstetrics and Gynecology

## 2012-10-04 ENCOUNTER — Encounter (HOSPITAL_COMMUNITY): Payer: Self-pay | Admitting: Adult Health

## 2012-10-04 ENCOUNTER — Emergency Department (HOSPITAL_COMMUNITY)
Admission: EM | Admit: 2012-10-04 | Discharge: 2012-10-05 | Disposition: A | Discharge status: Home / Self Care | Payer: Managed Care, Other (non HMO) | Attending: Emergency Medicine | Admitting: Emergency Medicine

## 2012-10-04 DIAGNOSIS — G43009 Migraine without aura, not intractable, without status migrainosus: Secondary | ICD-10-CM | POA: Insufficient documentation

## 2012-10-04 DIAGNOSIS — E039 Hypothyroidism, unspecified: Secondary | ICD-10-CM | POA: Insufficient documentation

## 2012-10-04 DIAGNOSIS — Z79899 Other long term (current) drug therapy: Secondary | ICD-10-CM | POA: Insufficient documentation

## 2012-10-04 MED ORDER — DIPHENHYDRAMINE HCL 50 MG/ML IJ SOLN
25.0000 mg | Freq: Once | INTRAMUSCULAR | Status: AC
  Administered 2012-10-04: 25 mg via INTRAVENOUS
  Filled 2012-10-04: qty 1

## 2012-10-04 MED ORDER — HYDROMORPHONE HCL PF 1 MG/ML IJ SOLN
1.0000 mg | Freq: Once | INTRAMUSCULAR | Status: AC
  Administered 2012-10-04: 1 mg via INTRAVENOUS
  Filled 2012-10-04: qty 1

## 2012-10-04 MED ORDER — PROMETHAZINE HCL 25 MG/ML IJ SOLN
25.0000 mg | Freq: Once | INTRAMUSCULAR | Status: AC
  Administered 2012-10-04: 25 mg via INTRAVENOUS
  Filled 2012-10-04: qty 1

## 2012-10-04 MED ORDER — SODIUM CHLORIDE 0.9 % IV BOLUS (SEPSIS)
1000.0000 mL | Freq: Once | INTRAVENOUS | Status: AC
  Administered 2012-10-04: 1000 mL via INTRAVENOUS

## 2012-10-04 NOTE — ED Provider Notes (Signed)
CSN: [id number]     Arrival date & time 10/04/12  2055 History  This chart was scribed for non-physician practitioner working with Gwyneth Sprout, MD by Ronal Fear, ED scribe. This patient was seen in room TR10C/TR10C and the patient's care was started at 10:06 PM.    Chief Complaint  Patient presents with  . Migraine    Patient is a 42 y.o. female presenting with migraines. The history is provided by the patient. No language interpreter was used.  Migraine This is a chronic problem. The current episode started 1 to 2 hours ago. The problem occurs every several days. The problem has not changed since onset.Associated symptoms include headaches. Nothing aggravates the symptoms. Nothing relieves the symptoms.  pt states that the pain feels like pressure in the back of her head. When she has similar occurences they are usually upon awakening. This particular occurences began gradually while she was in the kitchen since Wednesday at 3pm. pt took Imitrex pills and 2 injections for the migraines with no relief.  Headache is similar to prior headaches that she normallly has, except not improved with usual treatment.  Sts she usually has migraine headache twice weekly and has it since she was 16.  Has been cared for by neurologist, has had botox injection.  Denies fever, rash, trauma, numbness or weakness.  Does endorse light/sound sensitivity.  Denies having auras.  Past Medical History  Diagnosis Date  . Headache(784.0)     migraines  . Hypothyroidism    Past Surgical History  Procedure Laterality Date  . Uterine polyps     History reviewed. No pertinent family history. History  Substance Use Topics  . Smoking status: Never Smoker   . Smokeless tobacco: Never Used  . Alcohol Use: Yes     Comment: social   OB History   Grav Para Term Preterm Abortions TAB SAB Ect Mult Living                 Review of Systems  Constitutional: Negative for fever and chills.  HENT: Negative for neck  pain and neck stiffness.   Gastrointestinal: Negative for nausea and vomiting.  Skin: Negative for rash.  Neurological: Positive for headaches.    Allergies  Review of patient's allergies indicates no known allergies.  Home Medications   Current Outpatient Rx  Name  Route  Sig  Dispense  Refill  . desvenlafaxine (PRISTIQ) 100 MG 24 hr tablet   Oral   Take 100 mg by mouth daily.         Marland Kitchen gabapentin (NEURONTIN) 600 MG tablet   Oral   Take 600 mg by mouth at bedtime.         Marland Kitchen levothyroxine (SYNTHROID, LEVOTHROID) 175 MCG tablet   Oral   Take 175 mcg by mouth daily.         . SUMAtriptan (IMITREX) 100 MG tablet   Oral   Take 100 mg by mouth every 2 (two) hours as needed. For migraines.         . SUMAtriptan (IMITREX) 6 MG/0.5ML SOLN injection   Subcutaneous   Inject 6 mg into the skin every 2 (two) hours as needed. For migraines.         Marland Kitchen tiZANidine (ZANAFLEX) 4 MG tablet   Oral   Take 4 mg by mouth every 6 (six) hours as needed (spasms).         . zolpidem (AMBIEN) 10 MG tablet   Oral   Take 10  mg by mouth at bedtime as needed. For sleep.         Marland Kitchen zonisamide (ZONEGRAN) 100 MG capsule   Oral   Take 300 mg by mouth at bedtime.          BP 95/40  Pulse 78  Temp(Src) 98.6 F (37 C) (Oral)  Resp 16  SpO2 100% Physical Exam  Nursing note and vitals reviewed. Constitutional: She is oriented to person, place, and time. She appears well-developed and well-nourished. No distress.  HENT:  Head: Normocephalic and atraumatic.  Eyes: EOM are normal.  Neck: Neck supple. No tracheal deviation present.  Cardiovascular: Normal rate.   Pulmonary/Chest: Effort normal. No respiratory distress.  Musculoskeletal: Normal range of motion.  Neurological: She is alert and oriented to person, place, and time.  No focal neuro deficit or facial droop. No sudden onset thunderclap headache  Skin: Skin is warm and dry.  Psychiatric: She has a normal mood and affect.  Her behavior is normal.    ED Course  Procedures (including critical care time)  DIAGNOSTIC STUDIES: Oxygen Saturation is 100% on RA, normal by my interpretation.    COORDINATION OF CARE: 10:11 PM- Pt advised of plan for treatment including narcotics for pain and pt agrees.  Headache similar to previous, no fever, neck stiffness, neuro findings or new symptoms to suggest more serious etiology.  I don't think SAH, ICH, meningitis, encephalitis, mass at this time.  No recent trauma.  I don't feel imaging necessary at this time.  Plan to control symptoms.   11:48 PM Pt report improvement of sxs from migraine cocktail.  Agrees to f/u with neurologist for further management.  Pt stable for discharge   Labs Review Labs Reviewed - No data to display Imaging Review No results found.  MDM   1. Migraine headache without aura    BP 95/40  Pulse 78  Temp(Src) 98.6 F (37 C) (Oral)  Resp 16  SpO2 100%  I have reviewed nursing notes and vital signs.  I reviewed available ER/hospitalization records thought the EMR   I personally performed the services described in this documentation, which was scribed in my presence. The recorded information has been reviewed and is accurate.     Fayrene Helper, PA-C 10/04/12 2349

## 2012-10-04 NOTE — ED Notes (Addendum)
Presents with migraine since Weds, denies vomiting, mild nausea. Associated with light and sound sensitivity. "feels like a migraine"  Pt is alert and oriented, MAEx4, neurologically intact.

## 2012-10-05 NOTE — ED Provider Notes (Signed)
Medical screening examination/treatment/procedure(s) were performed by non-physician practitioner and as supervising physician I was immediately available for consultation/collaboration.   Gwyneth Sprout, MD 10/05/12 1407

## 2012-12-12 ENCOUNTER — Other Ambulatory Visit (HOSPITAL_COMMUNITY): Payer: Self-pay | Admitting: Pulmonary Disease

## 2012-12-12 DIAGNOSIS — R109 Unspecified abdominal pain: Secondary | ICD-10-CM

## 2012-12-17 ENCOUNTER — Ambulatory Visit (HOSPITAL_COMMUNITY)
Admission: RE | Admit: 2012-12-17 | Discharge: 2012-12-17 | Disposition: A | Discharge status: Home / Self Care | Payer: Managed Care, Other (non HMO) | Source: Ambulatory Visit | Attending: Pulmonary Disease | Admitting: Pulmonary Disease

## 2012-12-17 DIAGNOSIS — R109 Unspecified abdominal pain: Secondary | ICD-10-CM | POA: Insufficient documentation

## 2012-12-20 ENCOUNTER — Ambulatory Visit (INDEPENDENT_AMBULATORY_CARE_PROVIDER_SITE_OTHER): Payer: Self-pay | Admitting: Surgery

## 2012-12-25 ENCOUNTER — Other Ambulatory Visit (HOSPITAL_COMMUNITY): Payer: Self-pay | Admitting: Pulmonary Disease

## 2012-12-25 DIAGNOSIS — R110 Nausea: Secondary | ICD-10-CM

## 2012-12-25 DIAGNOSIS — R109 Unspecified abdominal pain: Secondary | ICD-10-CM

## 2012-12-30 ENCOUNTER — Encounter (HOSPITAL_COMMUNITY)
Admission: RE | Admit: 2012-12-30 | Discharge: 2012-12-30 | Disposition: A | Discharge status: Home / Self Care | Payer: Managed Care, Other (non HMO) | Source: Ambulatory Visit | Attending: Pulmonary Disease | Admitting: Pulmonary Disease

## 2012-12-30 ENCOUNTER — Encounter (HOSPITAL_COMMUNITY): Payer: Self-pay

## 2012-12-30 DIAGNOSIS — R109 Unspecified abdominal pain: Secondary | ICD-10-CM

## 2012-12-30 DIAGNOSIS — R110 Nausea: Secondary | ICD-10-CM | POA: Insufficient documentation

## 2012-12-30 DIAGNOSIS — R52 Pain, unspecified: Secondary | ICD-10-CM | POA: Insufficient documentation

## 2012-12-30 MED ORDER — STERILE WATER FOR INJECTION IJ SOLN
INTRAMUSCULAR | Status: AC
  Administered 2012-12-30: 1.27 mL via INTRAVENOUS
  Filled 2012-12-30: qty 10

## 2012-12-30 MED ORDER — TECHNETIUM TC 99M MEBROFENIN IV KIT
5.0000 | PACK | Freq: Once | INTRAVENOUS | Status: AC | PRN
  Administered 2012-12-30: 5 via INTRAVENOUS

## 2012-12-30 MED ORDER — SINCALIDE 5 MCG IJ SOLR
INTRAMUSCULAR | Status: AC
  Administered 2012-12-30: 1.27 ug via INTRAVENOUS
  Filled 2012-12-30: qty 5

## 2013-01-23 ENCOUNTER — Other Ambulatory Visit: Payer: Self-pay | Admitting: Gastroenterology

## 2013-01-23 DIAGNOSIS — R1013 Epigastric pain: Secondary | ICD-10-CM

## 2013-01-28 ENCOUNTER — Ambulatory Visit
Admission: RE | Admit: 2013-01-28 | Discharge: 2013-01-28 | Disposition: A | Discharge status: Home / Self Care | Payer: Managed Care, Other (non HMO) | Source: Ambulatory Visit | Attending: Gastroenterology | Admitting: Gastroenterology

## 2013-01-28 ENCOUNTER — Other Ambulatory Visit: Payer: Self-pay | Admitting: Gastroenterology

## 2013-01-28 ENCOUNTER — Inpatient Hospital Stay
Admission: RE | Admit: 2013-01-28 | Discharge: 2013-01-28 | Disposition: A | Discharge status: Home / Self Care | Payer: Managed Care, Other (non HMO) | Source: Ambulatory Visit | Attending: Gastroenterology | Admitting: Gastroenterology

## 2013-01-28 DIAGNOSIS — R1013 Epigastric pain: Secondary | ICD-10-CM

## 2013-01-28 MED ORDER — IOHEXOL 300 MG/ML  SOLN
100.0000 mL | Freq: Once | INTRAMUSCULAR | Status: AC | PRN
  Administered 2013-01-28: 100 mL via INTRAVENOUS

## 2013-01-29 ENCOUNTER — Other Ambulatory Visit (HOSPITAL_COMMUNITY): Payer: Self-pay | Admitting: Gastroenterology

## 2013-01-29 DIAGNOSIS — R1013 Epigastric pain: Secondary | ICD-10-CM

## 2013-02-05 ENCOUNTER — Ambulatory Visit (HOSPITAL_COMMUNITY): Admission: RE | Admit: 2013-02-05 | Payer: Managed Care, Other (non HMO) | Source: Ambulatory Visit

## 2013-03-13 ENCOUNTER — Encounter (INDEPENDENT_AMBULATORY_CARE_PROVIDER_SITE_OTHER): Payer: Self-pay | Admitting: *Deleted

## 2013-04-03 ENCOUNTER — Ambulatory Visit (INDEPENDENT_AMBULATORY_CARE_PROVIDER_SITE_OTHER): Payer: Managed Care, Other (non HMO) | Admitting: Internal Medicine

## 2013-06-03 ENCOUNTER — Telehealth (INDEPENDENT_AMBULATORY_CARE_PROVIDER_SITE_OTHER): Payer: Self-pay | Admitting: *Deleted

## 2013-06-03 NOTE — Telephone Encounter (Signed)
LM on 06/03/13 at 9:44 am to return the call at [phone].

## 2013-06-03 NOTE — Telephone Encounter (Signed)
Phone call here Friday 05/30/13 at 3:45  She was to had an appt with Terri back late March early April but she did not keep it due to a migraine and her stomach problems seemed to get better.  It is bothering her again and wants to know can she reschedule or should she get another referral from Dr. Luan Pulling.  Please  Call back  [phone]

## 2013-06-03 NOTE — Telephone Encounter (Signed)
Apt has been scheduled for 07/03/13 at 10:45 am with Deberah Castle, NP.

## 2013-07-03 ENCOUNTER — Encounter (INDEPENDENT_AMBULATORY_CARE_PROVIDER_SITE_OTHER): Payer: Self-pay | Admitting: Internal Medicine

## 2013-07-03 ENCOUNTER — Ambulatory Visit (INDEPENDENT_AMBULATORY_CARE_PROVIDER_SITE_OTHER): Payer: Managed Care, Other (non HMO) | Admitting: Internal Medicine

## 2013-07-03 ENCOUNTER — Encounter (INDEPENDENT_AMBULATORY_CARE_PROVIDER_SITE_OTHER): Payer: Self-pay | Admitting: *Deleted

## 2013-07-03 VITALS — BP 90/70 | HR 76 | Temp 97.6°F | Ht 68.0 in | Wt 140.6 lb

## 2013-07-03 DIAGNOSIS — R1013 Epigastric pain: Secondary | ICD-10-CM

## 2013-07-03 LAB — CBC WITH DIFFERENTIAL/PLATELET
BASOS ABS: 0 10*3/uL (ref 0.0–0.1)
Basophils Relative: 0 % (ref 0–1)
Eosinophils Absolute: 0.1 10*3/uL (ref 0.0–0.7)
Eosinophils Relative: 1 % (ref 0–5)
HCT: 43.1 % (ref 36.0–46.0)
Hemoglobin: 14.6 g/dL (ref 12.0–15.0)
LYMPHS PCT: 16 % (ref 12–46)
Lymphs Abs: 1.3 10*3/uL (ref 0.7–4.0)
MCH: 31.5 pg (ref 26.0–34.0)
MCHC: 33.9 g/dL (ref 30.0–36.0)
MCV: 92.9 fL (ref 78.0–100.0)
Monocytes Absolute: 0.5 10*3/uL (ref 0.1–1.0)
Monocytes Relative: 6 % (ref 3–12)
NEUTROS ABS: 6.4 10*3/uL (ref 1.7–7.7)
Neutrophils Relative %: 77 % (ref 43–77)
PLATELETS: 308 10*3/uL (ref 150–400)
RBC: 4.64 MIL/uL (ref 3.87–5.11)
RDW: 13.6 % (ref 11.5–15.5)
WBC: 8.3 10*3/uL (ref 4.0–10.5)

## 2013-07-03 LAB — HEPATIC FUNCTION PANEL
ALBUMIN: 4.9 g/dL (ref 3.5–5.2)
ALT: 16 U/L (ref 0–35)
AST: 18 U/L (ref 0–37)
Alkaline Phosphatase: 54 U/L (ref 39–117)
BILIRUBIN TOTAL: 0.7 mg/dL (ref 0.2–1.2)
Bilirubin, Direct: 0.2 mg/dL (ref 0.0–0.3)
Indirect Bilirubin: 0.5 mg/dL (ref 0.2–1.2)
Total Protein: 7.1 g/dL (ref 6.0–8.3)

## 2013-07-03 NOTE — Progress Notes (Signed)
Subjective:     Patient ID: Martha Aguilar, female   DOB: 1970-03-06, 43 y.o.   MRN: [id number]  HPI  Referred to our office by Dr. Luan Pulling for abdominal pain. She has epigastric pain. She has seen Dr. Oletta Lamas (GI) and underwent and EGD (January, 2015)  was negative. She cancelled the gastric emptying study.  She has a lot of gas. She has a lot of flatus.   She says it feels like a baby in her epigastric region. Today she tells me she has not had this pain for 3 weeks.  When the pain occurs, it remains x 24 hrs and is constant. She will lay on a heating pad.  She does take Miralax for chronic constipation. She says Miralax really does not help her BMs. She usually has a BM every 2-3 days. Appetite is good. No weight loss.  When she has the pain it is not related to eating.  No rectal bleeding melena.   01/23/2013 Pathology report: Normal small bowel mucosa. Negative for features of gluten (sprue). No Giardia.  01/28/2013 CT abdomen/pelvis with CM: epigastric pain:  IMPRESSION:  Unremarkable study.   12/25/2012 HIDA scan: Abdominal pain, nausea:  1. No acute findings.  2. Patent cystic duct without evidence for acute cholecystitis.    Review of Systems Past Medical History  Diagnosis Date  . Headache(784.0)     migraines  . Hypothyroidism     Past Surgical History  Procedure Laterality Date  . Uterine polyps      No Known Allergies  Current Outpatient Prescriptions on File Prior to Visit  Medication Sig Dispense Refill  . desvenlafaxine (PRISTIQ) 100 MG 24 hr tablet Take 100 mg by mouth daily.      Marland Kitchen levothyroxine (SYNTHROID, LEVOTHROID) 175 MCG tablet Take 175 mcg by mouth daily.      . SUMAtriptan (IMITREX) 100 MG tablet Take 100 mg by mouth every 2 (two) hours as needed. For migraines.      . SUMAtriptan (IMITREX) 6 MG/0.5ML SOLN injection Inject 6 mg into the skin every 2 (two) hours as needed. For migraines.      Marland Kitchen tiZANidine (ZANAFLEX) 4 MG tablet Take 4 mg by mouth  every 6 (six) hours as needed (spasms).      . zolpidem (AMBIEN) 10 MG tablet Take 10 mg by mouth at bedtime as needed. For sleep.       No current facility-administered medications on file prior to visit.        Objective:   Physical Exam  Filed Vitals:   07/03/13 1113  BP: 90/70  Pulse: 76  Temp: 97.6 F (36.4 C)  Height: 5\' 8"  (1.727 m)  Weight: 140 lb 9.6 oz (63.776 kg)   Alert and oriented. Skin warm and dry. Oral mucosa is moist.   . Sclera anicteric, conjunctivae is pink. Thyroid not enlarged. No cervical lymphadenopathy. Lungs clear. Heart regular rate and rhythm.  Abdomen is soft. Bowel sounds are positive. No hepatomegaly. No abdominal masses felt. No tenderness.  No edema to lower extremities.       Assessment:     Epigastric pain ? Etiogy. GB disease needs to be ruled out. Recent negative EGD.    Plan:     Hepatic function, CBC with diff. US abdomen. If pain reoccurs, call our office and we will repeat an hepatic function to see if there is a bump in her transaminases.

## 2013-07-03 NOTE — Patient Instructions (Signed)
Labs and [name]. Will discuss with Dr. Laural Golden

## 2013-07-08 ENCOUNTER — Other Ambulatory Visit (HOSPITAL_COMMUNITY): Payer: Managed Care, Other (non HMO)

## 2013-07-10 ENCOUNTER — Ambulatory Visit (HOSPITAL_COMMUNITY): Payer: Managed Care, Other (non HMO)

## 2013-07-16 ENCOUNTER — Ambulatory Visit (HOSPITAL_COMMUNITY)
Admission: RE | Admit: 2013-07-16 | Discharge: 2013-07-16 | Disposition: A | Discharge status: Home / Self Care | Payer: Managed Care, Other (non HMO) | Source: Ambulatory Visit | Attending: Internal Medicine | Admitting: Internal Medicine

## 2013-07-16 DIAGNOSIS — R1013 Epigastric pain: Secondary | ICD-10-CM

## 2013-12-28 ENCOUNTER — Emergency Department (HOSPITAL_COMMUNITY)
Admission: EM | Admit: 2013-12-28 | Discharge: 2013-12-28 | Disposition: A | Discharge status: Home / Self Care | Payer: Managed Care, Other (non HMO) | Attending: Emergency Medicine | Admitting: Emergency Medicine

## 2013-12-28 ENCOUNTER — Encounter (HOSPITAL_COMMUNITY): Payer: Self-pay | Admitting: Emergency Medicine

## 2013-12-28 DIAGNOSIS — E039 Hypothyroidism, unspecified: Secondary | ICD-10-CM | POA: Diagnosis not present

## 2013-12-28 DIAGNOSIS — G43009 Migraine without aura, not intractable, without status migrainosus: Secondary | ICD-10-CM

## 2013-12-28 DIAGNOSIS — G43909 Migraine, unspecified, not intractable, without status migrainosus: Secondary | ICD-10-CM | POA: Insufficient documentation

## 2013-12-28 DIAGNOSIS — Z79899 Other long term (current) drug therapy: Secondary | ICD-10-CM | POA: Insufficient documentation

## 2013-12-28 MED ORDER — SODIUM CHLORIDE 0.9 % IV BOLUS (SEPSIS)
1000.0000 mL | Freq: Once | INTRAVENOUS | Status: AC
  Administered 2013-12-28: 1000 mL via INTRAVENOUS

## 2013-12-28 MED ORDER — METOCLOPRAMIDE HCL 5 MG/ML IJ SOLN
10.0000 mg | Freq: Once | INTRAMUSCULAR | Status: AC
Start: 2013-12-28 — End: 2013-12-28
  Administered 2013-12-28: 10 mg via INTRAVENOUS
  Filled 2013-12-28: qty 2

## 2013-12-28 MED ORDER — DEXAMETHASONE SODIUM PHOSPHATE 4 MG/ML IJ SOLN
10.0000 mg | Freq: Once | INTRAMUSCULAR | Status: AC
  Administered 2013-12-28: 10 mg via INTRAVENOUS
  Filled 2013-12-28: qty 3

## 2013-12-28 MED ORDER — KETOROLAC TROMETHAMINE 30 MG/ML IJ SOLN
30.0000 mg | Freq: Once | INTRAMUSCULAR | Status: AC
  Administered 2013-12-28: 30 mg via INTRAVENOUS
  Filled 2013-12-28: qty 1

## 2013-12-28 MED ORDER — DIPHENHYDRAMINE HCL 50 MG/ML IJ SOLN
50.0000 mg | Freq: Once | INTRAMUSCULAR | Status: AC
Start: 2013-12-28 — End: 2013-12-28
  Administered 2013-12-28: 50 mg via INTRAVENOUS
  Filled 2013-12-28: qty 1

## 2013-12-28 NOTE — ED Notes (Signed)
Patient c/o migraine headache that started on Friday. Per patient using Imitrex injections at home with no relief. Patient denies any nausea, vomiting, dizziness, or blurred vision. Per patient sensitivity to light and sound. Hx of migraines.

## 2013-12-28 NOTE — Discharge Instructions (Signed)

## 2013-12-28 NOTE — ED Provider Notes (Signed)
This chart was scribed for [place], DO by Peyton Bottoms, ED Scribe. This patient was seen in room APA17/APA17 and the patient's care was started at 7:29 PM.  TIME SEEN: 7:29 PM   CHIEF COMPLAINT: Migraine  HPI: Patient is a 43 y.o. female with a history of migraine headaches and hypothyroidism, who presents to the ED complaining of moderate migraine headache that began 2 days ago. She states that she has been seen in the ED concerning similar migraine headaches in the past and states it improved with Dilaudid. She states that her current symptoms are typical at baseline for her migraines. She states she also gets botox injections for migraine headache relief.   Tried Imitrex injections at home without relief.  Has photophobia but no N/V.  No fever, head injury, neck pain, numbness or focal weakness.   ROS: See HPI Constitutional: no fever  Eyes: no drainage  ENT: no runny nose   Cardiovascular:  no chest pain  Resp: no SOB  GI: no vomiting GU: no dysuria Integumentary: no rash  Allergy: no hives  Musculoskeletal: no leg swelling  Neurological: no slurred speech; Migraine headache. ROS otherwise negative  PAST MEDICAL HISTORY/PAST SURGICAL HISTORY:  Past Medical History  Diagnosis Date  . Headache(784.0)     migraines  . Hypothyroidism     MEDICATIONS:  Prior to Admission medications   Medication Sig Start Date End Date Taking? Authorizing Provider  desvenlafaxine (PRISTIQ) 100 MG 24 hr tablet Take 100 mg by mouth daily.    Historical Provider, MD  levothyroxine (SYNTHROID, LEVOTHROID) 175 MCG tablet Take 175 mcg by mouth daily.    Historical Provider, MD  SUMAtriptan (IMITREX) 100 MG tablet Take 100 mg by mouth every 2 (two) hours as needed. For migraines.    Historical Provider, MD  SUMAtriptan (IMITREX) 6 MG/0.5ML SOLN injection Inject 6 mg into the skin every 2 (two) hours as needed. For migraines.    Historical Provider, MD  tiZANidine (ZANAFLEX) 4 MG tablet Take 4  mg by mouth every 6 (six) hours as needed (spasms).    Historical Provider, MD  topiramate (TOPAMAX) 100 MG tablet Take 100 mg by mouth 2 (two) times daily.    Historical Provider, MD  zolpidem (AMBIEN) 10 MG tablet Take 10 mg by mouth at bedtime as needed. For sleep.    Historical Provider, MD    ALLERGIES:  No Known Allergies  SOCIAL HISTORY:  History  Substance Use Topics  . Smoking status: Never Smoker   . Smokeless tobacco: Never Used  . Alcohol Use: Yes     Comment: social    FAMILY HISTORY: No family history on file.  EXAM:  Triage Vitals: BP 99/57 mmHg  Pulse 67  Temp(Src) 97.7 F (36.5 C) (Oral)  Resp 20  Ht 5\' 7"  (1.702 m)  Wt 136 lb (61.689 kg)  BMI 21.30 kg/m2  SpO2 100%  CONSTITUTIONAL: Alert and oriented and responds appropriately to questions. Well-appearing; well-nourished HEAD: Normocephalic EYES: Conjunctivae clear, PERRL; +photophobia ENT: normal nose; no rhinorrhea; moist mucous membranes; pharynx without lesions noted NECK: Supple, no meningismus, no LAD  CARD: RRR; S1 and S2 appreciated; no murmurs, no clicks, no rubs, no gallops RESP: Normal chest excursion without splinting or tachypnea; breath sounds clear and equal bilaterally; no wheezes, no rhonchi, no rales,  ABD/GI: Normal bowel sounds; non-distended; soft, non-tender, no rebound, no guarding BACK:  The back appears normal and is non-tender to palpation, there is no CVA tenderness EXT: Normal ROM  in all joints; non-tender to palpation; no edema; normal capillary refill; no cyanosis    SKIN: Normal color for age and race; warm NEURO: Moves all extremities equally; strength 5/5 in all 4 extremities, normal gait, normal sensation diffusely, CN 2-12 intactPt  PSYCH: The patient's mood and manner are appropriate. Grooming and personal hygiene are appropriate.  MEDICAL DECISION MAKING: Pt here with her typical migraine HA.  Well appearing, nontoxic, neuro intact, afebrile, no meningismus.  Will  treat with migraine cocktail - IVF, toradol, reglan, benadryl, decadron.    ED PROGRESS: 9:05 PM  Pt's HA is now at a 3/10 and she is ready for dc home.  D/w pt and father return precautions, supportive care instructions.  She verbalized understanding and is comfortable with plan.  I personally performed the services described in this documentation, which was scribed in my presence. The recorded information has been reviewed and is accurate.  [place], DO 12/29/13 [phone]

## 2014-06-11 ENCOUNTER — Encounter (HOSPITAL_COMMUNITY): Payer: Self-pay | Admitting: Emergency Medicine

## 2014-06-11 ENCOUNTER — Emergency Department (HOSPITAL_COMMUNITY)
Admission: EM | Admit: 2014-06-11 | Discharge: 2014-06-11 | Disposition: A | Discharge status: Home / Self Care | Payer: Managed Care, Other (non HMO) | Attending: Emergency Medicine | Admitting: Emergency Medicine

## 2014-06-11 DIAGNOSIS — R1110 Vomiting, unspecified: Secondary | ICD-10-CM | POA: Insufficient documentation

## 2014-06-11 DIAGNOSIS — R55 Syncope and collapse: Secondary | ICD-10-CM | POA: Diagnosis not present

## 2014-06-11 DIAGNOSIS — Z79899 Other long term (current) drug therapy: Secondary | ICD-10-CM | POA: Insufficient documentation

## 2014-06-11 DIAGNOSIS — E860 Dehydration: Secondary | ICD-10-CM | POA: Diagnosis not present

## 2014-06-11 DIAGNOSIS — E039 Hypothyroidism, unspecified: Secondary | ICD-10-CM | POA: Diagnosis not present

## 2014-06-11 DIAGNOSIS — G43909 Migraine, unspecified, not intractable, without status migrainosus: Secondary | ICD-10-CM

## 2014-06-11 LAB — I-STAT CHEM 8, ED
BUN: 18 mg/dL (ref 6–20)
CHLORIDE: 102 mmol/L (ref 101–111)
Calcium, Ion: 1.18 mmol/L (ref 1.12–1.23)
Creatinine, Ser: 1.1 mg/dL — ABNORMAL HIGH (ref 0.44–1.00)
Glucose, Bld: 104 mg/dL — ABNORMAL HIGH (ref 65–99)
HCT: 50 % — ABNORMAL HIGH (ref 36.0–46.0)
Hemoglobin: 17 g/dL — ABNORMAL HIGH (ref 12.0–15.0)
Potassium: 3.3 mmol/L — ABNORMAL LOW (ref 3.5–5.1)
SODIUM: 137 mmol/L (ref 135–145)
TCO2: 19 mmol/L (ref 0–100)

## 2014-06-11 MED ORDER — SODIUM CHLORIDE 0.9 % IV BOLUS (SEPSIS)
1000.0000 mL | Freq: Once | INTRAVENOUS | Status: AC
  Administered 2014-06-11: 1000 mL via INTRAVENOUS

## 2014-06-11 MED ORDER — PROCHLORPERAZINE EDISYLATE 5 MG/ML IJ SOLN
10.0000 mg | Freq: Once | INTRAMUSCULAR | Status: AC
Start: 2014-06-11 — End: 2014-06-11
  Administered 2014-06-11: 10 mg via INTRAVENOUS
  Filled 2014-06-11: qty 2

## 2014-06-11 MED ORDER — KETOROLAC TROMETHAMINE 30 MG/ML IJ SOLN
30.0000 mg | Freq: Once | INTRAMUSCULAR | Status: AC
  Administered 2014-06-11: 30 mg via INTRAVENOUS
  Filled 2014-06-11: qty 1

## 2014-06-11 MED ORDER — POTASSIUM CHLORIDE CRYS ER 20 MEQ PO TBCR
40.0000 meq | EXTENDED_RELEASE_TABLET | Freq: Once | ORAL | Status: AC
  Administered 2014-06-11: 40 meq via ORAL
  Filled 2014-06-11: qty 2

## 2014-06-11 MED ORDER — MAGNESIUM SULFATE 2 GM/50ML IV SOLN
2.0000 g | Freq: Once | INTRAVENOUS | Status: AC
  Administered 2014-06-11: 2 g via INTRAVENOUS
  Filled 2014-06-11: qty 50

## 2014-06-11 MED ORDER — DIPHENHYDRAMINE HCL 50 MG/ML IJ SOLN
12.5000 mg | Freq: Once | INTRAMUSCULAR | Status: AC
  Administered 2014-06-11: 12.5 mg via INTRAVENOUS
  Filled 2014-06-11: qty 1

## 2014-06-11 NOTE — ED Notes (Signed)
Pt states she had a migraine today and she took tizanidine and phenergan, states that she felt weak and fell in her bathroom. Pt denies loc, and states she did not hit her head. Pt was sent here from pcp for hypotension, bp 148/94 now. Pt alert, oriented x 4.

## 2014-06-11 NOTE — ED Notes (Signed)
PA west in room to assess patient. Patient stating she feels much better and is ready to go home. PA [place] advised patient does not have to get mag sulfate or second liter of fluid.

## 2014-06-11 NOTE — ED Provider Notes (Signed)
CSN: [id number]     Arrival date & time 06/11/14  1341 History   First MD Initiated Contact with Patient 06/11/14 1623     Chief Complaint  Patient presents with  . Loss of Consciousness  . Hypotension  . sent from dr's office      (Consider location/radiation/quality/duration/timing/severity/associated sxs/prior Treatment) HPI   Patient with hx migraines presents with her typical migraine headache that began 5 days ago.  Pain is located in the forehead, described as sharp and stabbing, associated sensitivity to light and sound, also N/V.  Last night the pain involved her entire head.  She used an ice pack wrap that did help somewhat with the pain.  Has vomited 3 times.  Has not been eating or drinking until today due to the pain. She has been on imitrex, tizanidine, toradol, phenergan for her headache with improvement from 25/10 last night to 7/10 today.  Last night she was taking both zanaflex and phenergan and had some visual hallucinations of spiders on the wall, people in the room, and was grocery shopping - the hallucinations have completely resolved at this time.   While at home this morning she had two episodes of near syncope, feeling weak and falling to the ground without LOC.   Today she was seen at her PCP office and sent here for IVF.  Found to have a BP of 84/40 in the office.    Denies fevers, recent illness, head trauma, neck pain or stiffness, "change from her typical migraine symptoms.  Denies focal neurologic deficits.   Past Medical History  Diagnosis Date  . Headache(784.0)     migraines  . Hypothyroidism    Past Surgical History  Procedure Laterality Date  . Uterine polyps     No family history on file. History  Substance Use Topics  . Smoking status: Never Smoker   . Smokeless tobacco: Never Used  . Alcohol Use: Yes     Comment: social   OB History    Gravida Para Term Preterm AB TAB SAB Ectopic Multiple Living            0     Review of Systems  All  other systems reviewed and are negative.     Allergies  Review of patient's allergies indicates no known allergies.  Home Medications   Prior to Admission medications   Medication Sig Start Date End Date Taking? Authorizing Provider  desvenlafaxine (PRISTIQ) 100 MG 24 hr tablet Take 100 mg by mouth daily.    Historical Provider, MD  ibuprofen (ADVIL,MOTRIN) 200 MG tablet Take 800 mg by mouth every 6 (six) hours as needed for headache or mild pain.    Historical Provider, MD  levothyroxine (SYNTHROID, LEVOTHROID) 175 MCG tablet Take 175 mcg by mouth daily.    Historical Provider, MD  SUMAtriptan (IMITREX) 100 MG tablet Take 100 mg by mouth every 2 (two) hours as needed. For migraines.    Historical Provider, MD  SUMAtriptan (IMITREX) 6 MG/0.5ML SOLN injection Inject 6 mg into the skin every 2 (two) hours as needed. For migraines.    Historical Provider, MD  tiZANidine (ZANAFLEX) 4 MG tablet Take 4 mg by mouth every 6 (six) hours as needed (spasms).    Historical Provider, MD  topiramate (TOPAMAX) 100 MG tablet Take 100 mg by mouth 2 (two) times daily.    Historical Provider, MD  zolpidem (AMBIEN) 10 MG tablet Take 10 mg by mouth at bedtime as needed. For sleep.  Historical Provider, MD   BP 115/75 mmHg  Pulse 87  Temp(Src) 97.6 F (36.4 C) (Oral)  Resp 16  Ht 5\' 7"  (1.702 m)  Wt 138 lb (62.596 kg)  BMI 21.61 kg/m2  SpO2 100% Physical Exam  Constitutional: She appears well-developed and well-nourished. No distress.  HENT:  Head: Normocephalic and atraumatic.  Eyes: Conjunctivae and EOM are normal. Right eye exhibits no discharge. Left eye exhibits no discharge.  Neck: Normal range of motion. Neck supple.  Cardiovascular: Normal rate and regular rhythm.   Pulmonary/Chest: Effort normal and breath sounds normal. No respiratory distress. She has no wheezes. She has no rales.  Abdominal: Soft. She exhibits no distension. There is no tenderness. There is no rebound and no guarding.   Neurological: She is alert.  CN II-XII intact, EOMs intact, no pronator drift, grip strengths equal bilaterally; strength 5/5 in all extremities, sensation intact in all extremities; finger to nose, heel to shin, rapid alternating movements normal.    Skin: She is not diaphoretic.  Psychiatric: She has a normal mood and affect. Her behavior is normal.  Nursing note and vitals reviewed.   ED Course  Procedures (including critical care time) Labs Review Labs Reviewed  I-STAT CHEM 8, ED - Abnormal; Notable for the following:    Potassium 3.3 (*)    Creatinine, Ser 1.10 (*)    Glucose, Bld 104 (*)    Hemoglobin 17.0 (*)    HCT 50.0 (*)    All other components within normal limits    Imaging Review No results found.   EKG Interpretation   Date/Time:  Thursday June 11 2014 13:53:30 EDT Ventricular Rate:  84 PR Interval:  146 QRS Duration: 92 QT Interval:  358 QTC Calculation: 423 R Axis:   64 Text Interpretation:  Normal sinus rhythm Biatrial enlargement Abnormal  ECG No prior for comparison Confirmed by Mingo Amber  MD, BLAIR ([id number]) on  06/11/2014 4:53:21 PM       6:16 PM Pain is now 3/10.  Pt reports she is ready for discharge.  Declines further treatment or fluids.  States she take do PO fluids and rest at home.  Denies feeling lightheaded or dizzy with standing.    MDM   Final diagnoses:  Migraine without status migrainosus, not intractable, unspecified migraine type  Near syncope  Dehydration    Afebrile, nontoxic patient with typical migraine  headache.  No red flags including head trauma, fevers, meningeal signs, focal neurologic deficits.  Migraine cocktail given with relief.    Also with near syncope after not eating x 4 days.  IVF given.  She did eat today prior to coming in and BP improved since hypotensive at PCP office.  Orthostatics negative.   Pt feeling much better after fluids and migraine cocktail.  D/C home with PCP follow up.  Discussed result, findings,  treatment, and follow up  with patient.  Pt given return precautions.  Pt verbalizes understanding and agrees with plan.       Clayton Bibles, PA-C 06/11/14 1825  Evelina Bucy, MD 06/11/14 [phone]

## 2014-06-11 NOTE — ED Notes (Signed)
Pt was able to complete orthostatic vital signs with no assistance needed. Pt remains monitored by blood pressure, pulse ox, and 5 lead. pts family remains at bedside.

## 2014-06-11 NOTE — ED Notes (Signed)
Was sent here from dr. Siri Cole office, with c/o headache x 3 days, states passed out this am after taking phenergan and tizadine for migraines-- went to dr's office and sent here. 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

## 2014-06-11 NOTE — ED Notes (Signed)
Pt had cbc, cmet and seratonin level drawn at dr's office-- and u/a.

## 2014-06-11 NOTE — Discharge Instructions (Signed)
Read the information below.  You may return to the Emergency Department at any time for worsening condition or any new symptoms that concern you.    You are having a headache. No specific cause was found today for your headache. It may have been a migraine or other cause of headache. Stress, anxiety, fatigue, and depression are common triggers for headaches. Your headache today does not appear to be life-threatening or require hospitalization, but often the exact cause of headaches is not determined in the emergency department. Therefore, follow-up with your doctor is very important to find out what may have caused your headache, and whether or not you need any further diagnostic testing or treatment. Sometimes headaches can appear benign (not harmful), but then more serious symptoms can develop which should prompt an immediate re-evaluation by your doctor or the emergency department. SEEK MEDICAL ATTENTION IF: You develop possible problems with medications prescribed.  The medications don't resolve your headache, if it recurs , or if you have multiple episodes of vomiting or can't take fluids. You have a change from the usual headache. RETURN IMMEDIATELY IF you develop a sudden, severe headache or confusion, become poorly responsive or faint, develop a fever above 100.46F or problem breathing, have a change in speech, vision, swallowing, or understanding, or develop new weakness, numbness, tingling, incoordination, or have a seizure.    Migraine Headache A migraine headache is an intense, throbbing pain on one or both sides of your head. A migraine can last for 30 minutes to several hours. CAUSES  The exact cause of a migraine headache is not always known. However, a migraine may be caused when nerves in the brain become irritated and release chemicals that cause inflammation. This causes pain. Certain things may also trigger migraines, such  as:  Alcohol.  Smoking.  Stress.  Menstruation.  Aged cheeses.  Foods or drinks that contain nitrates, glutamate, aspartame, or tyramine.  Lack of sleep.  Chocolate.  Caffeine.  Hunger.  Physical exertion.  Fatigue.  Medicines used to treat chest pain (nitroglycerine), birth control pills, estrogen, and some blood pressure medicines. SIGNS AND SYMPTOMS  Pain on one or both sides of your head.  Pulsating or throbbing pain.  Severe pain that prevents daily activities.  Pain that is aggravated by any physical activity.  Nausea, vomiting, or both.  Dizziness.  Pain with exposure to bright lights, loud noises, or activity.  General sensitivity to bright lights, loud noises, or smells. Before you get a migraine, you may get warning signs that a migraine is coming (aura). An aura may include:  Seeing flashing lights.  Seeing bright spots, halos, or zigzag lines.  Having tunnel vision or blurred vision.  Having feelings of numbness or tingling.  Having trouble talking.  Having muscle weakness. DIAGNOSIS  A migraine headache is often diagnosed based on:  Symptoms.  Physical exam.  A CT scan or MRI of your head. These imaging tests cannot diagnose migraines, but they can help rule out other causes of headaches. TREATMENT Medicines may be given for pain and nausea. Medicines can also be given to help prevent recurrent migraines.  HOME CARE INSTRUCTIONS  Only take over-the-counter or prescription medicines for pain or discomfort as directed by your health care provider. The use of long-term narcotics is not recommended.  Lie down in a dark, quiet room when you have a migraine.  Keep a journal to find out what may trigger your migraine headaches. For example, write down:  What you eat and  drink.  How much sleep you get.  Any change to your diet or medicines.  Limit alcohol consumption.  Quit smoking if you smoke.  Get 7-9 hours of sleep, or as  recommended by your health care provider.  Limit stress.  Keep lights dim if bright lights bother you and make your migraines worse. SEEK IMMEDIATE MEDICAL CARE IF:   Your migraine becomes severe.  You have a fever.  You have a stiff neck.  You have vision loss.  You have muscular weakness or loss of muscle control.  You start losing your balance or have trouble walking.  You feel faint or pass out.  You have severe symptoms that are different from your first symptoms. MAKE SURE YOU:   Understand these instructions.  Will watch your condition.  Will get help right away if you are not doing well or get worse. Document Released: 12/26/2004 Document Revised: 05/12/2013 Document Reviewed: 09/02/2012 [facility] Patient Information 2015 [place], [place]. This information is not intended to replace advice given to you by your health care provider. Make sure you discuss any questions you have with your health care provider.  Dehydration, Adult Dehydration is when you lose more fluids from the body than you take in. Vital organs like the kidneys, brain, and heart cannot function without a proper amount of fluids and salt. Any loss of fluids from the body can cause dehydration.  CAUSES   Vomiting.  Diarrhea.  Excessive sweating.  Excessive urine output.  Fever. SYMPTOMS  Mild dehydration  Thirst.  Dry lips.  Slightly dry mouth. Moderate dehydration  Very dry mouth.  Sunken eyes.  Skin does not bounce back quickly when lightly pinched and released.  Dark urine and decreased urine production.  Decreased tear production.  Headache. Severe dehydration  Very dry mouth.  Extreme thirst.  Rapid, weak pulse (more than 100 beats per minute at rest).  Cold hands and feet.  Not able to sweat in spite of heat and temperature.  Rapid breathing.  Blue lips.  Confusion and lethargy.  Difficulty being awakened.  Minimal urine production.  No  tears. DIAGNOSIS  Your caregiver will diagnose dehydration based on your symptoms and your exam. Blood and urine tests will help confirm the diagnosis. The diagnostic evaluation should also identify the cause of dehydration. TREATMENT  Treatment of mild or moderate dehydration can often be done at home by increasing the amount of fluids that you drink. It is best to drink small amounts of fluid more often. Drinking too much at one time can make vomiting worse. Refer to the home care instructions below. Severe dehydration needs to be treated at the hospital where you will probably be given intravenous (IV) fluids that contain water and electrolytes. HOME CARE INSTRUCTIONS   Ask your caregiver about specific rehydration instructions.  Drink enough fluids to keep your urine clear or pale yellow.  Drink small amounts frequently if you have nausea and vomiting.  Eat as you normally do.  Avoid:  Foods or drinks high in sugar.  Carbonated drinks.  Juice.  Extremely hot or cold fluids.  Drinks with caffeine.  Fatty, greasy foods.  Alcohol.  Tobacco.  Overeating.  Gelatin desserts.  Wash your hands well to avoid spreading bacteria and viruses.  Only take over-the-counter or prescription medicines for pain, discomfort, or fever as directed by your caregiver.  Ask your caregiver if you should continue all prescribed and over-the-counter medicines.  Keep all follow-up appointments with your caregiver. SEEK MEDICAL CARE IF:  You have abdominal pain and it increases or stays in one area (localizes).  You have a rash, stiff neck, or severe headache.  You are irritable, sleepy, or difficult to awaken.  You are weak, dizzy, or extremely thirsty. SEEK IMMEDIATE MEDICAL CARE IF:   You are unable to keep fluids down or you get worse despite treatment.  You have frequent episodes of vomiting or diarrhea.  You have blood or green matter (bile) in your vomit.  You have blood in  your stool or your stool looks black and tarry.  You have not urinated in 6 to 8 hours, or you have only urinated a small amount of very dark urine.  You have a fever.  You faint. MAKE SURE YOU:   Understand these instructions.  Will watch your condition.  Will get help right away if you are not doing well or get worse. Document Released: 12/26/2004 Document Revised: 03/20/2011 Document Reviewed: 08/15/2010 [facility] Patient Information 2015 [place], [place]. This information is not intended to replace advice given to you by your health care provider. Make sure you discuss any questions you have with your health care provider.

## 2014-10-06 ENCOUNTER — Encounter (HOSPITAL_COMMUNITY): Payer: Self-pay | Admitting: Emergency Medicine

## 2014-10-06 ENCOUNTER — Emergency Department (HOSPITAL_COMMUNITY)
Admission: EM | Admit: 2014-10-06 | Discharge: 2014-10-06 | Disposition: A | Discharge status: Home / Self Care | Payer: Managed Care, Other (non HMO) | Attending: Emergency Medicine | Admitting: Emergency Medicine

## 2014-10-06 DIAGNOSIS — Z79899 Other long term (current) drug therapy: Secondary | ICD-10-CM | POA: Insufficient documentation

## 2014-10-06 DIAGNOSIS — R51 Headache: Secondary | ICD-10-CM | POA: Diagnosis present

## 2014-10-06 DIAGNOSIS — E039 Hypothyroidism, unspecified: Secondary | ICD-10-CM | POA: Insufficient documentation

## 2014-10-06 DIAGNOSIS — G43809 Other migraine, not intractable, without status migrainosus: Secondary | ICD-10-CM

## 2014-10-06 DIAGNOSIS — E860 Dehydration: Secondary | ICD-10-CM | POA: Diagnosis not present

## 2014-10-06 MED ORDER — SODIUM CHLORIDE 0.9 % IV BOLUS (SEPSIS)
1000.0000 mL | Freq: Once | INTRAVENOUS | Status: AC
  Administered 2014-10-06: 1000 mL via INTRAVENOUS

## 2014-10-06 MED ORDER — KETOROLAC TROMETHAMINE 30 MG/ML IJ SOLN
30.0000 mg | Freq: Once | INTRAMUSCULAR | Status: AC
  Administered 2014-10-06: 30 mg via INTRAVENOUS
  Filled 2014-10-06: qty 1

## 2014-10-06 MED ORDER — DEXAMETHASONE SODIUM PHOSPHATE 4 MG/ML IJ SOLN
8.0000 mg | Freq: Once | INTRAMUSCULAR | Status: AC
  Administered 2014-10-06: 8 mg via INTRAVENOUS
  Filled 2014-10-06: qty 2

## 2014-10-06 MED ORDER — DIPHENHYDRAMINE HCL 50 MG/ML IJ SOLN
25.0000 mg | Freq: Once | INTRAMUSCULAR | Status: AC
  Administered 2014-10-06: 25 mg via INTRAVENOUS
  Filled 2014-10-06: qty 1

## 2014-10-06 MED ORDER — METOCLOPRAMIDE HCL 5 MG/ML IJ SOLN
10.0000 mg | Freq: Once | INTRAMUSCULAR | Status: AC
  Administered 2014-10-06: 10 mg via INTRAVENOUS
  Filled 2014-10-06: qty 2

## 2014-10-06 NOTE — Discharge Instructions (Signed)
Follow-up your primary care doctor. °

## 2014-10-06 NOTE — ED Notes (Signed)
Migraine for last 7 days, medicated with meds for migraine, with no relief.  History of migraine.  Took last Imitrex yesterday.

## 2014-10-06 NOTE — ED Provider Notes (Signed)
CSN: [id number]     Arrival date & time 10/06/14  1738 History   First MD Initiated Contact with Patient 10/06/14 1754     Chief Complaint  Patient presents with  . Migraine     (Consider location/radiation/quality/duration/timing/severity/associated sxs/prior Treatment) HPI....Marland KitchenMarland Kitchen presents with migraine headache for 7 days. She has a long history of migraines and this appears to be similar. She has tried Imitrex and Topamax at home with minimal relief. No fever, chills, neuro deficits, stiff neck. Severity is moderate to severe.  Past Medical History  Diagnosis Date  . Headache(784.0)     migraines  . Hypothyroidism    Past Surgical History  Procedure Laterality Date  . Uterine polyps     History reviewed. No pertinent family history. Social History  Substance Use Topics  . Smoking status: Never Smoker   . Smokeless tobacco: Never Used  . Alcohol Use: Yes     Comment: social   OB History    Gravida Para Term Preterm AB TAB SAB Ectopic Multiple Living            0     Review of Systems  All other systems reviewed and are negative.     Allergies  Review of patient's allergies indicates no known allergies.  Home Medications   Prior to Admission medications   Medication Sig Start Date End Date Taking? Authorizing Provider  Levonorgestrel-Ethinyl Estradiol (CAMRESE) 0.15-0.03 &0.01 MG tablet TAKE AS DIRECTED 06/29/14  Yes Historical Provider, MD  tiZANidine (ZANAFLEX) 4 MG tablet TAKE 1 TABLET BY MOUTH EVERY 8 HOURS AS NEEDED 09/02/14  Yes Historical Provider, MD  Ascorbic Acid (VITAMIN C PO) Take 1 tablet by mouth daily.    Historical Provider, MD  CAMRESE 0.15-0.03 &0.01 MG tablet Take 1 tablet by mouth daily. 03/31/14   Historical Provider, MD  gabapentin (NEURONTIN) 300 MG capsule Take 300 mg by mouth daily as needed (migraine).  06/03/14   Historical Provider, MD  ibuprofen (ADVIL,MOTRIN) 200 MG tablet Take 800 mg by mouth every 6 (six) hours as needed for headache or  mild pain.    Historical Provider, MD  levothyroxine (SYNTHROID, LEVOTHROID) 175 MCG tablet Take 175 mcg by mouth daily.    Historical Provider, MD  Multiple Vitamins-Minerals (MULTIVITAMIN & MINERAL PO) Take 1 tablet by mouth daily.    Historical Provider, MD  PREVIFEM 0.25-35 MG-MCG tablet Take 1 tablet by mouth daily. 10/01/14   Historical Provider, MD  SUMAtriptan (IMITREX) 100 MG tablet Take 100 mg by mouth every 2 (two) hours as needed. For migraines.    Historical Provider, MD  SUMAtriptan (IMITREX) 6 MG/0.5ML SOLN injection Inject 6 mg into the skin every 2 (two) hours as needed. For migraines.    Historical Provider, MD  SUMAtriptan 6 MG/0.5ML SOAJ INJECT SIX MG SUBCUTANEOUSLY AS NEEDED, MAY REPEAT IN TWO HOURS IF NEEDED. MAX TWO DOSES IN 24 HOURS. 09/23/14   Historical Provider, MD  tiZANidine (ZANAFLEX) 4 MG tablet Take 4 mg by mouth every 6 (six) hours as needed (spasms).    Historical Provider, MD  topiramate (TOPAMAX) 100 MG tablet Take 100 mg by mouth 2 (two) times daily.    Historical Provider, MD  venlafaxine XR (EFFEXOR-XR) 150 MG 24 hr capsule Take 150 mg by mouth at bedtime. 06/04/14   Historical Provider, MD  zolpidem (AMBIEN) 10 MG tablet Take 10 mg by mouth at bedtime as needed. For sleep.    Historical Provider, MD   BP 91/62 mmHg  Pulse 86  Temp(Src) 97.5 F (36.4 C) (Oral)  Resp 16  Ht 5\' 6"  (1.676 m)  Wt 133 lb (60.328 kg)  BMI 21.48 kg/m2  SpO2 100% Physical Exam  Constitutional: She is oriented to person, place, and time.  Slightly dehydrated  HENT:  Head: Normocephalic and atraumatic.  Eyes: Conjunctivae and EOM are normal. Pupils are equal, round, and reactive to light.  Neck: Normal range of motion. Neck supple.  Cardiovascular: Normal rate and regular rhythm.   Pulmonary/Chest: Effort normal and breath sounds normal.  Abdominal: Soft. Bowel sounds are normal.  Musculoskeletal: Normal range of motion.  Neurological: She is alert and oriented to person,  place, and time.  Skin: Skin is warm and dry.  Psychiatric: She has a normal mood and affect. Her behavior is normal.  Nursing note and vitals reviewed.   ED Course  Procedures (including critical care time) Labs Review Labs Reviewed - No data to display  Imaging Review No results found. I have personally reviewed and evaluated these images and lab results as part of my medical decision-making.   EKG Interpretation None      MDM   Final diagnoses:  Other type of nonintractable migraine    Patient feels better after 2 L of IV fluids, IV steroids, Toradol, Reglan, Benadryl. No neurological deficits.    Nat Christen, MD 10/06/14 [phone]

## 2015-02-04 ENCOUNTER — Encounter (HOSPITAL_COMMUNITY): Payer: Self-pay | Admitting: *Deleted

## 2015-02-04 ENCOUNTER — Emergency Department (HOSPITAL_COMMUNITY)
Admission: EM | Admit: 2015-02-04 | Discharge: 2015-02-04 | Disposition: A | Discharge status: Home / Self Care | Payer: Managed Care, Other (non HMO) | Attending: Emergency Medicine | Admitting: Emergency Medicine

## 2015-02-04 DIAGNOSIS — R51 Headache: Secondary | ICD-10-CM

## 2015-02-04 DIAGNOSIS — Z79899 Other long term (current) drug therapy: Secondary | ICD-10-CM | POA: Diagnosis not present

## 2015-02-04 DIAGNOSIS — E039 Hypothyroidism, unspecified: Secondary | ICD-10-CM | POA: Insufficient documentation

## 2015-02-04 DIAGNOSIS — R519 Headache, unspecified: Secondary | ICD-10-CM

## 2015-02-04 DIAGNOSIS — G43909 Migraine, unspecified, not intractable, without status migrainosus: Secondary | ICD-10-CM | POA: Insufficient documentation

## 2015-02-04 MED ORDER — DEXAMETHASONE SODIUM PHOSPHATE 10 MG/ML IJ SOLN
10.0000 mg | Freq: Once | INTRAMUSCULAR | Status: AC
  Administered 2015-02-04: 10 mg via INTRAVENOUS
  Filled 2015-02-04: qty 1

## 2015-02-04 MED ORDER — KETOROLAC TROMETHAMINE 30 MG/ML IJ SOLN
30.0000 mg | Freq: Once | INTRAMUSCULAR | Status: AC
  Administered 2015-02-04: 30 mg via INTRAVENOUS
  Filled 2015-02-04: qty 1

## 2015-02-04 MED ORDER — SODIUM CHLORIDE 0.9 % IV BOLUS (SEPSIS)
1000.0000 mL | Freq: Once | INTRAVENOUS | Status: AC
  Administered 2015-02-04: 1000 mL via INTRAVENOUS

## 2015-02-04 MED ORDER — METOCLOPRAMIDE HCL 5 MG/ML IJ SOLN
10.0000 mg | Freq: Once | INTRAMUSCULAR | Status: AC
  Administered 2015-02-04: 10 mg via INTRAVENOUS
  Filled 2015-02-04: qty 2

## 2015-02-04 MED ORDER — DIPHENHYDRAMINE HCL 50 MG/ML IJ SOLN
25.0000 mg | Freq: Once | INTRAMUSCULAR | Status: AC
  Administered 2015-02-04: 25 mg via INTRAVENOUS
  Filled 2015-02-04: qty 1

## 2015-02-04 NOTE — ED Notes (Signed)
Pt comes in with headache starting on Tuesday. Pt verbalizes she had taken Imitrex and other migraine medications including a shot she was prescribed. Denies n/v.

## 2015-02-04 NOTE — ED Provider Notes (Signed)
CSN: [id number]     Arrival date & time 02/04/15  1146 History  By signing my name below, I, Rohini Rajnarayanan, attest that this documentation has been prepared under the direction and in the presence of No att. providers found Electronically Signed: Evonnie Dawes, ED Scribe 02/04/2015 at 12:26 PM.     Chief Complaint  Patient presents with  . Headache   The history is provided by the patient. No language interpreter was used.   HPI Comments: Martha Aguilar is a 45 y.o. female with a history of migraines, who presents to the Emergency Department complaining of gradual onset, constant ,"squeezing, pressured and tight", non radiating, frontal and occipital migraine HA which began 2 days ago and became increasingly worse today. Pt denies any preceding aura to the migraine. She states she took Imitrex, Topamax and Ibuprofen with no significant relief.  Pt states that she usually has 2-3 migraines per week, however this is the first recurrence in 3 months after getting a Daithe piercing on her ear to try to reduce her migraines. Pt admits that she has had similar migraines in the past, and reports a family hx of the same. Pt denies any numbness, weakness, fevers, nausea, neck pain, or vomiting. Pt has a hx of DM, however has no related complaints at this time.   Past Medical History  Diagnosis Date  . Headache(784.0)     migraines  . Hypothyroidism    Past Surgical History  Procedure Laterality Date  . Uterine polyps     No family history on file. Social History  Substance Use Topics  . Smoking status: Never Smoker   . Smokeless tobacco: Never Used  . Alcohol Use: Yes     Comment: social   OB History    Gravida Para Term Preterm AB TAB SAB Ectopic Multiple Living            0     Review of Systems  Neurological: Positive for headaches.  All other systems reviewed and are negative.     Allergies  Review of patient's allergies indicates no known allergies.  Home  Medications   Prior to Admission medications   Medication Sig Start Date End Date Taking? Authorizing Provider  Ascorbic Acid (VITAMIN C PO) Take 1 tablet by mouth daily.   Yes Historical Provider, MD  DULoxetine (CYMBALTA) 60 MG capsule Take 60 mg by mouth at bedtime.  01/26/15  Yes Historical Provider, MD  gabapentin (NEURONTIN) 300 MG capsule Take 300 mg by mouth at bedtime.  06/03/14  Yes Historical Provider, MD  ibuprofen (ADVIL,MOTRIN) 200 MG tablet Take 800 mg by mouth every 6 (six) hours as needed for headache or mild pain.   Yes Historical Provider, MD  Levonorgestrel-Ethinyl Estradiol (CAMRESE) 0.15-0.03 &0.01 MG tablet TAKE ONE TABLET BY MOUTH ONCE DAILY IN THE EVENING CONTINUOUS 06/29/14  Yes Historical Provider, MD  levothyroxine (SYNTHROID, LEVOTHROID) 175 MCG tablet Take 175 mcg by mouth every evening.    Yes Historical Provider, MD  Multiple Vitamins-Minerals (MULTIVITAMIN & MINERAL PO) Take 1 tablet by mouth every evening.    Yes Historical Provider, MD  SUMAtriptan (IMITREX) 100 MG tablet Take 100 mg by mouth every 2 (two) hours as needed. For migraines.   Yes Historical Provider, MD  SUMAtriptan 6 MG/0.5ML SOAJ INJECT SIX MG SUBCUTANEOUSLY AS NEEDED, MAY REPEAT IN TWO HOURS IF NEEDED. MAX TWO DOSES IN 24 HOURS. 09/23/14  Yes Historical Provider, MD  tiZANidine (ZANAFLEX) 4 MG tablet Take 4 mg by  mouth at bedtime.    Yes Historical Provider, MD  topiramate (TOPAMAX) 100 MG tablet Take 100 mg by mouth at bedtime.    Yes Historical Provider, MD  zolpidem (AMBIEN) 10 MG tablet Take 10 mg by mouth at bedtime as needed. For sleep.   Yes Historical Provider, MD   BP 88/60 mmHg  Pulse 84  Temp(Src) 98.2 F (36.8 C) (Temporal)  Resp 18  Ht 5\' 7"  (1.702 m)  Wt 125 lb (56.7 kg)  BMI 19.57 kg/m2  SpO2 100% Physical Exam  Constitutional: She is oriented to person, place, and time. She appears well-developed and well-nourished. No distress.  HENT:  Head: Normocephalic and atraumatic.   Eyes: Conjunctivae and EOM are normal.  Neck: Neck supple. No tracheal deviation present.  Cardiovascular: Normal rate, regular rhythm, normal heart sounds and intact distal pulses.   Pulmonary/Chest: Effort normal and breath sounds normal. No respiratory distress.  Musculoskeletal: Normal range of motion.  Neurological: She is alert and oriented to person, place, and time.  No altered mental status, able to give full seemingly accurate history.  Face is symmetric, EOM's intact, pupils equal and reactive, vision intact, tongue and uvula midline without deviation Upper and Lower extremity motor 5/5, intact pain perception in distal extremities, 2+ reflexes in biceps, patella and achilles tendons. Finger to nose normal, heel to shin normal. Walks without assistance or evident ataxia.   Skin: Skin is warm and dry.  Psychiatric: She has a normal mood and affect. Her behavior is normal.  Nursing note and vitals reviewed.   ED Course  Procedures (including critical care time) DIAGNOSTIC STUDIES: Oxygen Saturation is 100% on RA, normal by my interpretation.    COORDINATION OF CARE: 12:01 PM-Discussed treatment plan which includes a migraine cocktail with pt at bedside and pt agreed to plan.   Labs Review Labs Reviewed - No data to display  Imaging Review No results found.    EKG Interpretation None      MDM   Final diagnoses:  Nonintractable headache, unspecified chronicity pattern, unspecified headache type    45 yo F w/ h/o migraines here with exacerbation of same. Has been present for a few days now, not improved with home imitrex or ibuprofen. Similar to previous, gradual in onset. No associated neuro changes, neuro exam unremarkable as well. Headache improved here with cocktail, neuro exam unchanged, stable for dc with continued outpatient follow up. Will return here for new/worsening sx.    I personally performed the services described in this documentation, which was  scribed in my presence. The recorded information has been reviewed and is accurate.      Merrily Pew, MD 02/04/15 [phone]

## 2015-04-04 ENCOUNTER — Emergency Department (HOSPITAL_COMMUNITY)
Admission: EM | Admit: 2015-04-04 | Discharge: 2015-04-05 | Disposition: A | Discharge status: Home / Self Care | Payer: Managed Care, Other (non HMO) | Attending: Emergency Medicine | Admitting: Emergency Medicine

## 2015-04-04 ENCOUNTER — Encounter (HOSPITAL_COMMUNITY): Payer: Self-pay

## 2015-04-04 DIAGNOSIS — Z791 Long term (current) use of non-steroidal anti-inflammatories (NSAID): Secondary | ICD-10-CM | POA: Diagnosis not present

## 2015-04-04 DIAGNOSIS — I10 Essential (primary) hypertension: Secondary | ICD-10-CM | POA: Diagnosis not present

## 2015-04-04 DIAGNOSIS — E039 Hypothyroidism, unspecified: Secondary | ICD-10-CM | POA: Insufficient documentation

## 2015-04-04 DIAGNOSIS — R110 Nausea: Secondary | ICD-10-CM | POA: Diagnosis not present

## 2015-04-04 DIAGNOSIS — Z79899 Other long term (current) drug therapy: Secondary | ICD-10-CM | POA: Insufficient documentation

## 2015-04-04 LAB — I-STAT CHEM 8, ED
BUN: 19 mg/dL (ref 6–20)
CALCIUM ION: 1.24 mmol/L — AB (ref 1.12–1.23)
CREATININE: 0.7 mg/dL (ref 0.44–1.00)
Chloride: 102 mmol/L (ref 101–111)
GLUCOSE: 87 mg/dL (ref 65–99)
HCT: 47 % — ABNORMAL HIGH (ref 36.0–46.0)
HEMOGLOBIN: 16 g/dL — AB (ref 12.0–15.0)
POTASSIUM: 3.6 mmol/L (ref 3.5–5.1)
Sodium: 140 mmol/L (ref 135–145)
TCO2: 26 mmol/L (ref 0–100)

## 2015-04-04 NOTE — ED Notes (Signed)
My blood pressure has been up over the past couple of months and I have not seen a doctor for it.  It was up at work today and I felt funny so I thought I should come in.

## 2015-04-04 NOTE — ED Provider Notes (Signed)
CSN: [id number]     Arrival date & time 04/04/15  2121 History  By signing my name below, I, [facility], attest that this documentation has been prepared under the direction and in the presence of Merryl Hacker, MD. Electronically Signed: Virgel Bouquet, ED Scribe. 04/05/2015. 12:47 AM.   Chief Complaint  Patient presents with  . Hypertension   The history is provided by the patient. No language interpreter was used.   HPI Comments: ABUK DELVAL is a 45 y.o. female with an hx of hypothyroidism who presents to the Emergency Department complaining of recurrent, mild heart palpations most recently earlier tonight while driving. 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 She states that she was driving earlier tonight when she felt nauseous and heart palpations began. Denies CP.  She endorses associated nausea and facial and neck redness during the episodes of hypertension. Currently assymptomatic No alleviating or exacerbating factors. She has not seen her PCP Dr. Luan Pulling for these symptoms and last had her thyroid checked last year during an annual physical. She has an appointment with her PCP tomorrow. She is an occasional social drinker and denies illicit drug use. Denies recent illnesses. Denies CP, vomiting, diarrhea.  Past Medical History  Diagnosis Date  . Headache(784.0)     migraines  . Hypothyroidism    Past Surgical History  Procedure Laterality Date  . Uterine polyps     No family history on file. Social History  Substance Use Topics  . Smoking status: Never Smoker   . Smokeless tobacco: Never Used  . Alcohol Use: Yes     Comment: social   OB History    Gravida Para Term Preterm AB TAB SAB Ectopic Multiple Living            0     Review of Systems  Constitutional: Negative for fever.  Cardiovascular: Positive for palpitations. Negative for  chest pain.  Gastrointestinal: Positive for nausea. Negative for vomiting and diarrhea.  Skin: Positive for color change (face and neck).  All other systems reviewed and are negative.     Allergies  Review of patient's allergies indicates no known allergies.  Home Medications   Prior to Admission medications   Medication Sig Start Date End Date Taking? Authorizing Provider  Ascorbic Acid (VITAMIN C PO) Take 1 tablet by mouth daily.   Yes Historical Provider, MD  DULoxetine (CYMBALTA) 60 MG capsule Take 60 mg by mouth at bedtime.  01/26/15  Yes Historical Provider, MD  gabapentin (NEURONTIN) 300 MG capsule Take 300 mg by mouth at bedtime.  06/03/14  Yes Historical Provider, MD  ibuprofen (ADVIL,MOTRIN) 200 MG tablet Take 800 mg by mouth every 6 (six) hours as needed for headache or mild pain.   Yes Historical Provider, MD  Levonorgestrel-Ethinyl Estradiol (CAMRESE) 0.15-0.03 &0.01 MG tablet TAKE ONE TABLET BY MOUTH ONCE DAILY IN THE EVENING CONTINUOUS 06/29/14  Yes Historical Provider, MD  levothyroxine (SYNTHROID, LEVOTHROID) 175 MCG tablet Take 175 mcg by mouth every evening.    Yes Historical Provider, MD  Multiple Vitamins-Minerals (MULTIVITAMIN & MINERAL PO) Take 1 tablet by mouth every evening.    Yes Historical Provider, MD  tiZANidine (ZANAFLEX) 4 MG tablet Take 4 mg by mouth at bedtime.    Yes Historical Provider, MD  topiramate (TOPAMAX) 100 MG tablet Take 100 mg by mouth at bedtime.    Yes  Historical Provider, MD  zolpidem (AMBIEN) 10 MG tablet Take 10 mg by mouth at bedtime. For sleep.   Yes Historical Provider, MD  hydrochlorothiazide (MICROZIDE) 12.5 MG capsule Take 1 capsule (12.5 mg total) by mouth daily. 04/05/15   Merryl Hacker, MD  lisinopril (PRINIVIL,ZESTRIL) 5 MG tablet Take 1 tablet (5 mg total) by mouth daily. 04/05/15   Merryl Hacker, MD  SUMAtriptan (IMITREX) 100 MG tablet Take 100 mg by mouth every 2 (two) hours as needed. For migraines.    Historical Provider,  MD  SUMAtriptan 6 MG/0.5ML SOAJ INJECT SIX MG SUBCUTANEOUSLY AS NEEDED, MAY REPEAT IN TWO HOURS IF NEEDED. MAX TWO DOSES IN 24 HOURS. 09/23/14   Historical Provider, MD   BP 172/98 mmHg  Pulse 70  Temp(Src) 97.5 F (36.4 C) (Oral)  Resp 16  Ht 5\' 7"  (1.702 m)  Wt 130 lb (58.968 kg)  BMI 20.36 kg/m2  SpO2 100% Physical Exam  Constitutional: She is oriented to person, place, and time. She appears well-developed and well-nourished.  Anxious appearing  HENT:  Head: Normocephalic and atraumatic.  Eyes: Pupils are equal, round, and reactive to light.  Cardiovascular: Normal rate, regular rhythm and normal heart sounds.   No murmur heard. Pulmonary/Chest: Effort normal and breath sounds normal. No respiratory distress. She has no wheezes.  Abdominal: Soft. Bowel sounds are normal. There is no tenderness. There is no rebound.  Neurological: She is alert and oriented to person, place, and time.  Skin: Skin is warm and dry.  Psychiatric: She has a normal mood and affect.  Nursing note and vitals reviewed.   ED Course  Procedures   DIAGNOSTIC STUDIES: Oxygen Saturation is 100% on RA, normal by my interpretation.    COORDINATION OF CARE: 11:23 PM Will order EKG and labs. Advised pt to follow-up with PCP. Will provide pt with work note. Discussed treatment plan with pt at bedside and pt agreed to plan.   Labs Review Labs Reviewed  I-STAT CHEM 8, ED - Abnormal; Notable for the following:    Calcium, Ion 1.24 (*)    Hemoglobin 16.0 (*)    HCT 47.0 (*)    All other components within normal limits  I-STAT TROPOININ, ED   I have personally reviewed and evaluated these lab results as part of my medical decision-making.   EKG Interpretation None      MDM   Final diagnoses:  Essential hypertension    Patient presents with hypertension. Reports palpitations and "feeling funny" prior to arrival. Denies chest pain. States that she has a several month history of high blood pressure  at work.  Initial blood pressure 181/100. She has a primary physician appointment tomorrow. She's currently asymptomatic. EKG is reassuring and troponin as well as Chem-8 are also reassuring. Repeat blood pressure 164/98.  Discussed with patient that she probably needs to be on blood pressure medication. Would defer to her primary physician for definitive management; however, given her blood pressure ranges, will start her on HCTZ and lisinopril. She's remained asymptomatic in the ER. Doubt hypertensive urgency or emergency. Follow-up with PCP as scheduled tomorrow.  After history, exam, and medical workup I feel the patient has been appropriately medically screened and is safe for discharge home. Pertinent diagnoses were discussed with the patient. Patient was given return precautions.  I personally performed the services described in this documentation, which was scribed in my presence. The recorded information has been reviewed and is accurate.    Merryl Hacker, MD  04/05/15 0050 

## 2015-04-05 LAB — I-STAT TROPONIN, ED: Troponin i, poc: 0 ng/mL (ref 0.00–0.08)

## 2015-04-05 MED ORDER — LISINOPRIL 5 MG PO TABS: 5.0000 mg | ORAL_TABLET | Freq: Every day | ORAL | Status: DC

## 2015-04-05 MED ORDER — HYDROCHLOROTHIAZIDE 12.5 MG PO CAPS: 12.5000 mg | ORAL_CAPSULE | Freq: Every day | ORAL | Status: DC

## 2015-04-05 MED ORDER — HYDROCHLOROTHIAZIDE 12.5 MG PO CAPS
12.5000 mg | ORAL_CAPSULE | Freq: Every day | ORAL | Status: DC
  Administered 2015-04-05: 12.5 mg via ORAL
  Filled 2015-04-05 (×2): qty 1

## 2015-04-05 MED ORDER — LISINOPRIL 5 MG PO TABS
5.0000 mg | ORAL_TABLET | Freq: Every day | ORAL | Status: DC
  Administered 2015-04-05: 5 mg via ORAL
  Filled 2015-04-05: qty 1

## 2015-04-05 NOTE — Discharge Instructions (Signed)
Hypertension Hypertension, commonly called high blood pressure, is when the force of blood pumping through your arteries is too strong. Your arteries are the blood vessels that carry blood from your heart throughout your body. A blood pressure reading consists of a higher number over a lower number, such as 110/72. The higher number (systolic) is the pressure inside your arteries when your heart pumps. The lower number (diastolic) is the pressure inside your arteries when your heart relaxes. Ideally you want your blood pressure below 120/80. Hypertension forces your heart to work harder to pump blood. Your arteries may become narrow or stiff. Having untreated or uncontrolled hypertension can cause heart attack, stroke, kidney disease, and other problems. RISK FACTORS Some risk factors for high blood pressure are controllable. Others are not.  Risk factors you cannot control include:   Race. You may be at higher risk if you are African American.  Age. Risk increases with age.  Gender. Men are at higher risk than women before age 45 years. After age 65, women are at higher risk than men. Risk factors you can control include:  Not getting enough exercise or physical activity.  Being overweight.  Getting too much fat, sugar, calories, or salt in your diet.  Drinking too much alcohol. SIGNS AND SYMPTOMS Hypertension does not usually cause signs or symptoms. Extremely high blood pressure (hypertensive crisis) may cause headache, anxiety, shortness of breath, and nosebleed. DIAGNOSIS To check if you have hypertension, your health care provider will measure your blood pressure while you are seated, with your arm held at the level of your heart. It should be measured at least twice using the same arm. Certain conditions can cause a difference in blood pressure between your right and left arms. A blood pressure reading that is higher than normal on one occasion does not mean that you need treatment. If  it is not clear whether you have high blood pressure, you may be asked to return on a different day to have your blood pressure checked again. Or, you may be asked to monitor your blood pressure at home for 1 or more weeks. TREATMENT Treating high blood pressure includes making lifestyle changes and possibly taking medicine. Living a healthy lifestyle can help lower high blood pressure. You may need to change some of your habits. Lifestyle changes may include:  Following the DASH diet. This diet is high in fruits, vegetables, and whole grains. It is low in salt, red meat, and added sugars.  Keep your sodium intake below 2,300 mg per day.  Getting at least 30-45 minutes of aerobic exercise at least 4 times per week.  Losing weight if necessary.  Not smoking.  Limiting alcoholic beverages.  Learning ways to reduce stress. Your health care provider may prescribe medicine if lifestyle changes are not enough to get your blood pressure under control, and if one of the following is true:  You are 18-59 years of age and your systolic blood pressure is above 140.  You are 60 years of age or older, and your systolic blood pressure is above 150.  Your diastolic blood pressure is above 90.  You have diabetes, and your systolic blood pressure is over 140 or your diastolic blood pressure is over 90.  You have kidney disease and your blood pressure is above 140/90.  You have heart disease and your blood pressure is above 140/90. Your personal target blood pressure may vary depending on your medical conditions, your age, and other factors. HOME CARE INSTRUCTIONS    Have your blood pressure rechecked as directed by your health care provider.   Take medicines only as directed by your health care provider. Follow the directions carefully. Blood pressure medicines must be taken as prescribed. The medicine does not work as well when you skip doses. Skipping doses also puts you at risk for  problems.  Do not smoke.   Monitor your blood pressure at home as directed by your health care provider. SEEK MEDICAL CARE IF:   You think you are having a reaction to medicines taken.  You have recurrent headaches or feel dizzy.  You have swelling in your ankles.  You have trouble with your vision. SEEK IMMEDIATE MEDICAL CARE IF:  You develop a severe headache or confusion.  You have unusual weakness, numbness, or feel faint.  You have severe chest or abdominal pain.  You vomit repeatedly.  You have trouble breathing. MAKE SURE YOU:   Understand these instructions.  Will watch your condition.  Will get help right away if you are not doing well or get worse.   This information is not intended to replace advice given to you by your health care provider. Make sure you discuss any questions you have with your health care provider.   Document Released: 12/26/2004 Document Revised: 05/12/2014 Document Reviewed: 10/18/2012 Elsevier Interactive Patient Education 2016 [employer]  

## 2015-06-17 ENCOUNTER — Other Ambulatory Visit: Payer: Self-pay | Admitting: Obstetrics and Gynecology

## 2015-07-11 ENCOUNTER — Emergency Department (HOSPITAL_COMMUNITY): Payer: Managed Care, Other (non HMO)

## 2015-07-11 ENCOUNTER — Emergency Department (HOSPITAL_COMMUNITY)
Admission: EM | Admit: 2015-07-11 | Discharge: 2015-07-11 | Disposition: A | Discharge status: Home / Self Care | Payer: Managed Care, Other (non HMO) | Attending: Emergency Medicine | Admitting: Emergency Medicine

## 2015-07-11 ENCOUNTER — Encounter (HOSPITAL_COMMUNITY): Payer: Self-pay | Admitting: Emergency Medicine

## 2015-07-11 DIAGNOSIS — E039 Hypothyroidism, unspecified: Secondary | ICD-10-CM | POA: Insufficient documentation

## 2015-07-11 DIAGNOSIS — Z791 Long term (current) use of non-steroidal anti-inflammatories (NSAID): Secondary | ICD-10-CM | POA: Insufficient documentation

## 2015-07-11 DIAGNOSIS — I10 Essential (primary) hypertension: Secondary | ICD-10-CM | POA: Diagnosis not present

## 2015-07-11 DIAGNOSIS — R51 Headache: Secondary | ICD-10-CM | POA: Insufficient documentation

## 2015-07-11 DIAGNOSIS — Z79899 Other long term (current) drug therapy: Secondary | ICD-10-CM | POA: Diagnosis not present

## 2015-07-11 DIAGNOSIS — R531 Weakness: Secondary | ICD-10-CM

## 2015-07-11 LAB — URINALYSIS, ROUTINE W REFLEX MICROSCOPIC
Bilirubin Urine: NEGATIVE
GLUCOSE, UA: NEGATIVE mg/dL
Hgb urine dipstick: NEGATIVE
Ketones, ur: NEGATIVE mg/dL
LEUKOCYTES UA: NEGATIVE
Nitrite: NEGATIVE
PH: 6 (ref 5.0–8.0)
Protein, ur: NEGATIVE mg/dL
SPECIFIC GRAVITY, URINE: 1.01 (ref 1.005–1.030)

## 2015-07-11 LAB — COMPREHENSIVE METABOLIC PANEL
ALBUMIN: 3.9 g/dL (ref 3.5–5.0)
ALT: 59 U/L — ABNORMAL HIGH (ref 14–54)
ANION GAP: 7 (ref 5–15)
AST: 34 U/L (ref 15–41)
Alkaline Phosphatase: 58 U/L (ref 38–126)
BUN: 18 mg/dL (ref 6–20)
CO2: 25 mmol/L (ref 22–32)
Calcium: 8.9 mg/dL (ref 8.9–10.3)
Chloride: 108 mmol/L (ref 101–111)
Creatinine, Ser: 0.82 mg/dL (ref 0.44–1.00)
GFR calc Af Amer: >60 mL/min (ref >60)
GFR calc non Af Amer: >60 mL/min (ref >60)
GLUCOSE: 97 mg/dL (ref 65–99)
POTASSIUM: 3.5 mmol/L (ref 3.5–5.1)
SODIUM: 140 mmol/L (ref 135–145)
Total Bilirubin: 0.5 mg/dL (ref 0.3–1.2)
Total Protein: 6.5 g/dL (ref 6.5–8.1)

## 2015-07-11 LAB — CBC WITH DIFFERENTIAL/PLATELET
BASOS ABS: 0 10*3/uL (ref 0.0–0.1)
BASOS PCT: 0 %
EOS ABS: 0 10*3/uL (ref 0.0–0.7)
Eosinophils Relative: 1 %
HCT: 39.7 % (ref 36.0–46.0)
HEMOGLOBIN: 13.5 g/dL (ref 12.0–15.0)
LYMPHS PCT: 31 %
Lymphs Abs: 1.6 10*3/uL (ref 0.7–4.0)
MCH: 31.5 pg (ref 26.0–34.0)
MCHC: 34 g/dL (ref 30.0–36.0)
MCV: 92.5 fL (ref 78.0–100.0)
MONO ABS: 0.5 10*3/uL (ref 0.1–1.0)
MONOS PCT: 10 %
NEUTROS ABS: 2.9 10*3/uL (ref 1.7–7.7)
Neutrophils Relative %: 58 %
Platelets: 241 10*3/uL (ref 150–400)
RBC: 4.29 MIL/uL (ref 3.87–5.11)
RDW: 11.7 % (ref 11.5–15.5)
WBC: 5 10*3/uL (ref 4.0–10.5)

## 2015-07-11 LAB — I-STAT CG4 LACTIC ACID, ED
Lactic Acid, Venous: 0.99 mmol/L (ref 0.5–1.9)
Lactic Acid, Venous: 0.43 mmol/L — ABNORMAL LOW (ref 0.5–1.9)

## 2015-07-11 LAB — I-STAT TROPONIN, ED: Troponin i, poc: 0 ng/mL (ref 0.00–0.08)

## 2015-07-11 MED ORDER — ONDANSETRON 4 MG PO TBDP: ORAL_TABLET | ORAL | Status: DC

## 2015-07-11 MED ORDER — SODIUM CHLORIDE 0.9 % IV BOLUS (SEPSIS)
2000.0000 mL | Freq: Once | INTRAVENOUS | Status: AC
  Administered 2015-07-11: 2000 mL via INTRAVENOUS

## 2015-07-11 MED ORDER — HYDROCODONE-ACETAMINOPHEN 5-325 MG PO TABS
1.0000 | ORAL_TABLET | Freq: Once | ORAL | Status: AC
  Administered 2015-07-11: 1 via ORAL
  Filled 2015-07-11: qty 1

## 2015-07-11 NOTE — Discharge Instructions (Signed)
Follow up with your md this week. °

## 2015-07-11 NOTE — ED Notes (Signed)
Pt c/o feeling weak and dehydrated x 2 weeks. Denies n/v/d. Reports recent surgery-uterine ablation and tubal ligation on 06/17/15.

## 2015-07-11 NOTE — ED Provider Notes (Signed)
CSN: [id number]     Arrival date & time 07/11/15  1736 History   First MD Initiated Contact with Patient 07/11/15 1742     Chief Complaint  Patient presents with  . Weakness     (Consider location/radiation/quality/duration/timing/severity/associated sxs/prior Treatment) Patient is a 45 y.o. female presenting with weakness. The history is provided by the patient (Patient complains of headache weakness for several days also nausea and poor by mouth intake).  Weakness This is a new problem. The current episode started more than 1 week ago. The problem occurs constantly. The problem has not changed since onset.Associated symptoms include headaches. Pertinent negatives include no chest pain and no abdominal pain. Nothing aggravates the symptoms. Nothing relieves the symptoms.    Past Medical History  Diagnosis Date  . Headache(784.0)     migraines  . Hypothyroidism   . Hypertension    Past Surgical History  Procedure Laterality Date  . Uterine polyps    . Tubal ligation    . Uterine ablation     No family history on file. Social History  Substance Use Topics  . Smoking status: Never Smoker   . Smokeless tobacco: Never Used  . Alcohol Use: Yes     Comment: social   OB History    Gravida Para Term Preterm AB TAB SAB Ectopic Multiple Living            0     Review of Systems  Constitutional: Negative for appetite change and fatigue.  HENT: Negative for congestion, ear discharge and sinus pressure.   Eyes: Negative for discharge.  Respiratory: Negative for cough.   Cardiovascular: Negative for chest pain.  Gastrointestinal: Negative for abdominal pain and diarrhea.  Genitourinary: Negative for frequency and hematuria.  Musculoskeletal: Negative for back pain.  Skin: Negative for rash.  Neurological: Positive for weakness and headaches. Negative for seizures.  Psychiatric/Behavioral: Negative for hallucinations.      Allergies  Review of patient's allergies indicates  no known allergies.  Home Medications   Prior to Admission medications   Medication Sig Start Date End Date Taking? Authorizing Provider  atenolol (TENORMIN) 25 MG tablet Take 25 mg by mouth at bedtime.   Yes Historical Provider, MD  DULoxetine (CYMBALTA) 60 MG capsule Take 60 mg by mouth at bedtime.    Yes Historical Provider, MD  gabapentin (NEURONTIN) 300 MG capsule Take 300 mg by mouth at bedtime.    Yes Historical Provider, MD  ibuprofen (ADVIL,MOTRIN) 200 MG tablet Take 800 mg by mouth every 6 (six) hours as needed for headache or mild pain.   Yes Historical Provider, MD  levothyroxine (SYNTHROID, LEVOTHROID) 175 MCG tablet Take 175 mcg by mouth at bedtime.    Yes Historical Provider, MD  Multiple Vitamin (MULTIVITAMIN WITH MINERALS) TABS tablet Take 1 tablet by mouth at bedtime.   Yes Historical Provider, MD  SUMAtriptan (IMITREX) 100 MG tablet Take 100 mg by mouth every 2 (two) hours as needed for migraine.    Yes Historical Provider, MD  SUMAtriptan (IMITREX) 6 MG/0.5ML SOLN injection Inject 6 mg into the skin every 2 (two) hours as needed for migraine or headache. May repeat in 2 hours if headache persists or recurs.   Yes Historical Provider, MD  tiZANidine (ZANAFLEX) 4 MG tablet Take 4 mg by mouth at bedtime.    Yes Historical Provider, MD  topiramate (TOPAMAX) 100 MG tablet Take 100 mg by mouth at bedtime.    Yes Historical Provider, MD  traZODone (DESYREL) 100  MG tablet Take 100 mg by mouth at bedtime.   Yes Historical Provider, MD  vitamin C (ASCORBIC ACID) 500 MG tablet Take 500 mg by mouth at bedtime.   Yes Historical Provider, MD  zolpidem (AMBIEN) 10 MG tablet Take 10 mg by mouth at bedtime.    Yes Historical Provider, MD  ondansetron (ZOFRAN ODT) 4 MG disintegrating tablet 4mg  ODT q4 hours prn nausea/vomit 07/11/15   Milton Ferguson, MD   BP 117/59 mmHg  Pulse 81  Temp(Src) 98.3 F (36.8 C) (Temporal)  Resp 20  Ht 5\' 7"  (1.702 m)  Wt 125 lb (56.7 kg)  BMI 19.57 kg/m2   SpO2 100% Physical Exam  Constitutional: She is oriented to person, place, and time. She appears well-developed.  HENT:  Head: Normocephalic.  Mucous membranes mildly dry  Eyes: Conjunctivae and EOM are normal. No scleral icterus.  Neck: Neck supple. No thyromegaly present.  Cardiovascular: Normal rate and regular rhythm.  Exam reveals no gallop and no friction rub.   No murmur heard. Pulmonary/Chest: No stridor. She has no wheezes. She has no rales. She exhibits no tenderness.  Abdominal: She exhibits no distension. There is no tenderness. There is no rebound.  Musculoskeletal: Normal range of motion. She exhibits no edema.  Lymphadenopathy:    She has no cervical adenopathy.  Neurological: She is oriented to person, place, and time. She exhibits normal muscle tone. Coordination normal.  Skin: No rash noted. No erythema.  Psychiatric: She has a normal mood and affect. Her behavior is normal.    ED Course  Procedures (including critical care time) Labs Review Labs Reviewed  COMPREHENSIVE METABOLIC PANEL - Abnormal; Notable for the following:    ALT 59 (*)    All other components within normal limits  I-STAT CG4 LACTIC ACID, ED - Abnormal; Notable for the following:    Lactic Acid, Venous 0.43 (*)    All other components within normal limits  CBC WITH DIFFERENTIAL/PLATELET  URINALYSIS, ROUTINE W REFLEX MICROSCOPIC (NOT AT [facility])  ROCKY MTN SPOTTED FVR ABS PNL(IGG+IGM)  B. BURGDORFI ANTIBODIES  I-STAT CG4 LACTIC ACID, ED  Randolm Idol, ED    Imaging Review Dg Chest 2 View  07/11/2015  CLINICAL DATA:  Weakness and dehydration 2 weeks. EXAM: CHEST  2 VIEW COMPARISON:  None. FINDINGS: Lungs are adequately inflated without consolidation or effusion. Cardiomediastinal silhouette is normal. Bones soft tissues are normal. IMPRESSION: No active cardiopulmonary disease. Electronically Signed   By: Marin Olp M.D.   On: 07/11/2015 18:32   I have personally reviewed and evaluated  these images and lab results as part of my medical decision-making.   EKG Interpretation   Date/Time:  Sunday July 11 2015 18:19:54 EDT Ventricular Rate:  84 PR Interval:    QRS Duration: 103 QT Interval:  358 QTC Calculation: 424 R Axis:   57 Text Interpretation:  Sinus rhythm Consider left atrial enlargement Low  voltage, precordial leads Baseline wander in lead(s) V1 Confirmed by  Malikai Gut  MD, Jenalyn Girdner ([id number]) on 07/11/2015 9:22:37 PM      MDM   Final diagnoses:  Weakness   Patient improved with fluids. Patient with dehydration and malaise. She will follow-up with her PCP. She will have [facility] spotted fever and Lyme's titers sent.    Milton Ferguson, MD 07/11/15 2147

## 2015-07-13 LAB — B. BURGDORFI ANTIBODIES: B burgdorferi Ab IgG+IgM: <0.91 {ISR} (ref 0.00–0.90)

## 2015-07-14 LAB — ROCKY MTN SPOTTED FVR ABS PNL(IGG+IGM)
RMSF IgG: NEGATIVE
RMSF IgM: 0.54 index (ref 0.00–0.89)

## 2015-08-18 ENCOUNTER — Encounter: Payer: Self-pay | Admitting: Neurology

## 2015-08-18 ENCOUNTER — Ambulatory Visit (INDEPENDENT_AMBULATORY_CARE_PROVIDER_SITE_OTHER): Payer: Self-pay | Admitting: Neurology

## 2015-08-18 VITALS — BP 166/97 | HR 89 | Ht 66.5 in | Wt 133.6 lb

## 2015-08-18 DIAGNOSIS — G43709 Chronic migraine without aura, not intractable, without status migrainosus: Secondary | ICD-10-CM

## 2015-08-18 DIAGNOSIS — R110 Nausea: Secondary | ICD-10-CM

## 2015-08-18 MED ORDER — ONDANSETRON 4 MG PO TBDP: ORAL_TABLET | ORAL | 12 refills | Status: DC

## 2015-08-18 MED ORDER — DIVALPROEX SODIUM ER 500 MG PO TB24: 500.0000 mg | ORAL_TABLET | Freq: Every day | ORAL | 11 refills | Status: DC

## 2015-08-18 NOTE — Patient Instructions (Addendum)
Remember to drink plenty of fluid, eat healthy meals and do not skip any meals. Try to eat protein with a every meal and eat a healthy snack such as fruit or nuts in between meals. Try to keep a regular sleep-wake schedule and try to exercise daily, particularly in the form of walking, 20-30 minutes a day, if you can.   As far as your medications are concerned, I would like to suggest Depakote 500mg  at night Continue Botox  CGRP for migraine. Cephaly. Sphenocath.   Acute management: Imitex injectable. Try combining with zofran and an NSAID  I would like to see you back in 3 months, sooner if we need to. Please call us with any interim questions, concerns, problems, updates or refill requests.   Our phone number is [phone]. We also have an after hours call service for urgent matters and there is a physician on-call for urgent questions. For any emergencies you know to call 911 or go to the nearest emergency room   Valproic Acid, Divalproex Sodium delayed or extended-release tablets What is this medicine? DIVALPROEX SODIUM (dye VAL pro ex SO dee um) is used to prevent seizures caused by some forms of epilepsy. It is also used to treat bipolar mania and to prevent migraine headaches. This medicine may be used for other purposes; ask your health care provider or pharmacist if you have questions. What should I tell my health care provider before I take this medicine? They need to know if you have any of these conditions: -blood disease -brain damage or disease -kidney disease -liver disease -low blood proteins -mitochondrial disease -suicidal thoughts, plans, or attempt; a previous suicide attempt by you or a family member -urea cycle disorder (UCD) -an unusual or allergic reaction to divalproex sodium, other medicines, foods, dyes, or preservatives -pregnant or trying to get pregnant -breast-feeding How should I use this medicine? Take this medicine by mouth with a drink of water.  Follow the directions on the prescription label. Do not crush or chew. If this medicine upsets your stomach, take it with food or milk. Take your medicine at regular intervals. Do not take it more often than directed. Talk to your pediatrician regarding the use of this medicine in children. Special care may be needed. Overdosage: If you think you have taken too much of this medicine contact a poison control center or emergency room at once. NOTE: This medicine is only for you. Do not share this medicine with others. What if I miss a dose? If you miss a dose, take it as soon as you can. If it is almost time for your next dose, take only that dose. Do not take double or extra doses. What may interact with this medicine? -aspirin -barbiturates, like phenobarbital -diazepam -isoniazid -medicines for depression, anxiety, or psychotic disturbances -medicines that treat or prevent blood clots like warfarin -meropenem -other seizure medicines -rifampin -tolbutamide -zidovudine This list may not describe all possible interactions. Give your health care provider a list of all the medicines, herbs, non-prescription drugs, or dietary supplements you use. Also tell them if you smoke, drink alcohol, or use illegal drugs. Some items may interact with your medicine. What should I watch for while using this medicine? Visit your doctor or health care professional for regular checks on your progress. If you are taking this medicine to treat epilepsy (seizures), do not stop taking it suddenly. This increases the risk of seizures. Wear a medical identification bracelet or chain to say you have epilepsy or  seizures, and carry a card that lists all your medicines. You may get drowsy, dizzy, or have blurred vision. Do not drive, use machinery, or do anything that needs mental alertness until you know how this medicine affects you. To reduce dizzy or fainting spells, do not sit or stand up quickly, especially if you are  an older patient. Alcohol can increase drowsiness and dizziness. Avoid alcoholic drinks. This medicine can cause blood problems. This can mean slow healing and a risk of infection. Problems can arise if you need dental work, and in the day to day care of your teeth. Try to avoid damage to your teeth and gums when you brush or floss your teeth. This medicine can make you more sensitive to the sun. Keep out of the sun. If you cannot avoid being in the sun, wear protective clothing and use sunscreen. Do not use sun lamps or tanning beds/booths. The use of this medicine may increase the chance of suicidal thoughts or actions. Pay special attention to how you are responding while on this medicine. Any worsening of mood, or thoughts of suicide or dying should be reported to your health care professional right away. Women who become pregnant while using this medicine may enroll in the [place] Pregnancy Registry by calling [phone]. This registry collects information about the safety of antiepileptic drug use during pregnancy. Contact your doctor or healthcare professional if you notice any part of your medicine in your stool. Your healthcare provider may want to check the amount of medicine in your blood if this happens. What side effects may I notice from receiving this medicine? Side effects that you should report to your doctor or health care professional as soon as possible: -allergic reactions like skin rash, itching or hives, swelling of the face, lips, or tongue -changes in the frequency or severity of seizures -double vision or uncontrollable eye movements -nausea and vomiting -redness, blistering, peeling or loosening of the skin, including inside the mouth -stomach pain or cramps -trembling of hands or arms -unusual bleeding or bruising or pinpoint red spots on the skin -unusual swelling of the arms or legs -unusually weak or tired -worsening of mood, thoughts or  actions of suicide or dying -yellowing of skin or eyes Side effects that usually do not require medical attention (report to your doctor or health care professional if they continue or are bothersome): -change in menstrual cycle -diarrhea or constipation -headache -loss of bladder control -loss of hair or unusual growth of hair -loss or increase in appetite -weight gain or loss This list may not describe all possible side effects. Call your doctor for medical advice about side effects. You may report side effects to FDA at 1-800-FDA-1088. Where should I keep my medicine? Keep out of reach of children. Store at room temperature between 15 and 30 degrees C (59 and 86 degrees F). Keep container tightly closed. Throw away any unused medicine after the expiration date. NOTE: This sheet is a summary. It may not cover all possible information. If you have questions about this medicine, talk to your doctor, pharmacist, or health care provider.    2016, Elsevier/Gold Standard. (2011-08-22 11:50:42)

## 2015-08-18 NOTE — Progress Notes (Signed)
[facility]    Provider:  Dr Jaynee Eagles Referring Provider: Sinda Du, MD Primary Care Physician:  Alonza Bogus, MD  CC:  Migrained  HPI:  Martha Aguilar is a 45 y.o. female here as a referral from Dr. Luan Pulling for migraines. Chronic since 1990. Here for second opinion. She has been going to see Dr. Sima Matas in [place] at the [facility] Worsening in the setting of a divorce. Started at the age of 15. Mother had migraines. She has been seeing Dr. Sima Matas for many years. She has pounding and pressure in the middle of the head, sometimes more on the right. She gets migraines 2 a week at least and they can last several days. She takes an imitrex shot for acute management. Triggers are wine, weather, exhaustion. Her sleeping is better now. She has been on [place] for 10 years. She takes advil every day. Migraines have lasted 3 days. She has light sensitivity, sound sensitivity, has to lay still in a dark room,  no nausea or vomiting. No aura. They come on very quickly. She is only on synthroid and ambien. She feels a lot better since discontinuing all her medications. She has tried multiple medications. She has been getting botox for migraine for at least 5 years which has tremendously helped she used to have daily migraines. She has at least 15 days a month of headache of which at least 8 are migrainous. No medication overuse. She gets good benefit from botox. Migraines can be severe. No aura.   Reviewed notes, labs and imaging from outside physicians, which showed:   Patient is here for second opinion. She is to Dr. Claudie Fisherman in [place]. As of last note, She has 8 severe headaches in a month, and severity is severe. Location is frontal. The headaches last for 12 hours. Pain is described as pressure-like and tight. Symptoms have been associated with sensitivity to light, sensitivity to noise and worse with movement activities. Denies nausea, vomiting, odor  sensitivity or dizziness. Headache triggers include alcohol, fatigue, sleep deprivation, whether, periods of sinus problems. Since Botox patient reduced her headaches from almost daily to recently 16 headache free days and a 20 headache free days last visit with Dr. Sima Matas. She's been getting Botox for several years. She is under stress with recent separation. Her BP was elevated so she was prescribed lisinopril and hydrochlorothiazide. Imitrex has not been as effective lately and she would be interested in trying Maxalt. Vitamins especially CoQ10 seem to help. She's had piercings and she tried taking neuro device. She's been told to consider hysterectomy to decrease the headaches.  Past medications and past medications include: Excedrin and Ultram, D.H.E. capsule, DHE injection RV, Relpax and Zomig tablets, Imitrex. Zonegran. Reglan and Zofran. Flexeril. Prednisone. Effexor, Pristiq, Wellbutrin, Cymbalta. Procedures Botox. Atenolol. On Cymbalta, Neurontin, lisinopril, Zanaflex, Topamax 300 mg by mouth daily. Took amitriptyline, nortriptyline. Relpax.   LDL 99, BUN 18, creatinine 0.8, TSH 0.6. BUN and creatinine were drawn in July 2017.  Review of Systems: Patient complains of symptoms per HPI as well as the following symptoms: no cp, no sob, no hx of cardiac problems. Pertinent negatives per HPI. All others negative.   Social History   Social History  . Marital status: Legally Separated    Spouse name: N/A  . Number of children: 1  . Years of education: 42   Occupational History  . Nurse    Social History Main Topics  . Smoking status: Never Smoker  .  Smokeless tobacco: Never Used  . Alcohol use Yes     Comment: social  . Drug use: No  . Sexual activity: Not on file   Other Topics Concern  . Not on file   Social History Narrative   Lives with daughter   Caffeine use: rare    History reviewed. No pertinent family history.  Past Medical History:  Diagnosis Date  .  Headache(784.0)    migraines  . Hypertension   . Hypothyroidism     Past Surgical History:  Procedure Laterality Date  . TUBAL LIGATION    . uterine ablation    . uterine polyps      Current Outpatient Prescriptions  Medication Sig Dispense Refill  . FEVERFEW PO Take 1 Dose by mouth daily.    Marland Kitchen ibuprofen (ADVIL,MOTRIN) 200 MG tablet Take 800 mg by mouth every 6 (six) hours as needed for headache or mild pain.    Marland Kitchen levothyroxine (SYNTHROID, LEVOTHROID) 175 MCG tablet Take 175 mcg by mouth at bedtime.     . Multiple Vitamin (MULTIVITAMIN WITH MINERALS) TABS tablet Take 1 tablet by mouth at bedtime.    . SUMAtriptan (IMITREX) 100 MG tablet Take 100 mg by mouth every 2 (two) hours as needed for migraine.     . SUMAtriptan (IMITREX) 6 MG/0.5ML SOLN injection Inject 6 mg into the skin every 2 (two) hours as needed for migraine or headache. May repeat in 2 hours if headache persists or recurs.    Marland Kitchen tiZANidine (ZANAFLEX) 4 MG tablet Take 4 mg by mouth at bedtime.     . vitamin C (ASCORBIC ACID) 500 MG tablet Take 500 mg by mouth at bedtime.    Marland Kitchen zolpidem (AMBIEN) 10 MG tablet Take 10 mg by mouth at bedtime.     . divalproex (DEPAKOTE ER) 500 MG 24 hr tablet Take 1 tablet (500 mg total) by mouth daily. 30 tablet 11  . ondansetron (ZOFRAN ODT) 4 MG disintegrating tablet Take 1-2 tablets (4 mg total) by mouth every 8 (eight) hours as needed 30 tablet 12   No current facility-administered medications for this visit.     Allergies as of 08/18/2015  . (No Known Allergies)    Vitals: BP (!) 166/97 (BP Location: Left Arm, Patient Position: Sitting, Cuff Size: Normal)   Pulse 89   Ht 5' 6.5" (1.689 m)   Wt 133 lb 9.6 oz (60.6 kg)   BMI 21.24 kg/m  Last Weight:  Wt Readings from Last 1 Encounters:  08/18/15 133 lb 9.6 oz (60.6 kg)   Last Height:   Ht Readings from Last 1 Encounters:  08/18/15 5' 6.5" (1.689 m)   Physical exam: Exam: Gen: NAD, conversant, well nourised, obese, well  groomed                     CV: RRR, no MRG. No Carotid Bruits. No peripheral edema, warm, nontender Eyes: Conjunctivae clear without exudates or hemorrhage  Neuro: Detailed Neurologic Exam  Speech:    Speech is normal; fluent and spontaneous with normal comprehension.  Cognition:    The patient is oriented to person, place, and time;     recent and remote memory intact;     language fluent;     normal attention, concentration,     fund of knowledge Cranial Nerves:    The pupils are equal, round, and reactive to light. The fundi are normal and spontaneous venous pulsations are present. Visual fields are full to finger  confrontation. Extraocular movements are intact. Trigeminal sensation is intact and the muscles of mastication are normal. The face is symmetric. The palate elevates in the midline. Hearing intact. Voice is normal. Shoulder shrug is normal. The tongue has normal motion without fasciculations.   Coordination:    Normal finger to nose and heel to shin. Normal rapid alternating movements.   Gait:    Heel-toe and tandem gait are normal.   Motor Observation:    No asymmetry, no atrophy, and no involuntary movements noted. Tone:    Normal muscle tone.    Posture:    Posture is normal. normal erect    Strength:    Strength is V/V in the upper and lower limbs.      Sensation: intact to LT     Reflex Exam:  DTR's:    Deep tendon reflexes in the upper and lower extremities are normal bilaterally.   Toes:    The toes are downgoing bilaterally.   Clonus:    Clonus is absent.     Assessment/Plan:  45 year old with chronic migraines without aura without status migrainosus not intractable.  Remember to drink plenty of fluid, eat healthy meals and do not skip any meals. Try to eat protein with every meal and eat a healthy snack such as fruit or nuts in between meals. Try to keep a regular sleep-wake schedule and try to exercise daily, particularly in the form of  walking, 20-30 minutes a day, if you can.   As far as your medications are concerned, I would like to suggest Depakote 500mg  at night Continue Botox  Discussed CGRP for migraine. Cephaly. Sphenocath.   Acute management: Imitex injectable. Try combining with zofran and an NSAID  I would like to see you back in 3 months, sooner if we need to. Please call us with any interim questions, concerns, problems, updates or refill requests.   Our phone number is [phone]. We also have an after hours call service for urgent matters and there is a physician on-call for urgent questions. For any emergencies you know to call 911 or go to the nearest emergency room  Discussed side effects of depakote as per patient instructions, discussed risk of severe birth defects, do not get pregnant, use double backup.   Sarina Ill, MD  [facility] [address] [place], [place] [address]  Phone [phone] Fax [phone]

## 2015-08-22 DIAGNOSIS — G43709 Chronic migraine without aura, not intractable, without status migrainosus: Secondary | ICD-10-CM | POA: Insufficient documentation

## 2015-08-23 ENCOUNTER — Encounter: Payer: Self-pay | Admitting: Neurology

## 2015-09-16 ENCOUNTER — Telehealth: Payer: Self-pay | Admitting: Neurology

## 2015-09-16 NOTE — Telephone Encounter (Signed)
Spoke with patient who stated that she is getting new insurance and will call back to schedule once she has coverage.

## 2015-09-30 ENCOUNTER — Other Ambulatory Visit: Payer: Self-pay | Admitting: Neurology

## 2015-09-30 MED ORDER — TIZANIDINE HCL 4 MG PO TABS: 4.0000 mg | ORAL_TABLET | Freq: Four times a day (QID) | ORAL | 1 refills | Status: DC | PRN

## 2015-10-05 ENCOUNTER — Telehealth: Payer: Self-pay | Admitting: Neurology

## 2015-10-05 NOTE — Telephone Encounter (Signed)
Pt called in about study that was mentioned during her visit. She does not know the name of the study because she is interested. Please call [phone] after 3. She does not have cell service until she leaves work

## 2015-10-07 ENCOUNTER — Telehealth: Payer: Self-pay | Admitting: Neurology

## 2015-10-07 ENCOUNTER — Other Ambulatory Visit: Payer: Self-pay | Admitting: Neurology

## 2015-10-07 NOTE — Telephone Encounter (Signed)
Dr Jaynee Eagles- please call pt, thank you  Called and spoke to pt. Relayed Dr Jaynee Eagles message. She verbalized understanding. She would like to start on cymbalta or another mood stabilizing medication as previously discussed with Dr Jaynee Eagles.   I spoke to Dr Jaynee Eagles while pt on the phone and per Dr Jaynee Eagles she will call her tomorrow to discuss. I relayed this to pt. Pt verbalized understanding and asked she call her after 3pm. She will be at work until then.

## 2015-10-07 NOTE — Telephone Encounter (Signed)
Martha Aguilar, I refilled her Tizanidine. Please let patient know that I am happy to refill any of her migraine medications that Dr. Sima Matas was giving her. I just wanted her to know however as a policy I don;t prescribe Ambien and if dr. Sima Matas was giving that to her she will have to get that from her pcp in the future thanks!

## 2015-10-10 ENCOUNTER — Other Ambulatory Visit: Payer: Self-pay | Admitting: Neurology

## 2015-10-10 MED ORDER — AMITRIPTYLINE HCL 10 MG PO TABS: 10.0000 mg | ORAL_TABLET | Freq: Every day | ORAL | 6 refills | Status: DC

## 2015-10-10 NOTE — Telephone Encounter (Signed)
Spoke to patient she Is having insomnia and is feeling emotional. Will star Amitriptyline as it is a good migraine medication and may help with insomia and mood as well. Explained this is not as good for depression as the SSRIs or cymbalta but can help with her insomnia. Discussed side effects. Discussed side effects including teratogenicity, do not Pregnant on this medication and use birth control. Serious side effects can include hypotension, hypertension, syncope, ventricular arrhythmias, QT prolongation and other cardiac side effects, stroke and seizures, ataxia tardive dyskinesias, extrapyramidal symptoms, increased intraocular pressure, leukopenia, thrombocytopenia, hallucinations, suicidality and other serious side effects. Common reactions include drowsiness, dry mouth, dizziness, constipation, blurred vision, palpitations, tachycardia, impaired coordination, increased appetite, nausea vomiting, weakness, confusion, disorientation, restlessness, anxiety and other side effects. No history of heart problems.

## 2015-10-11 ENCOUNTER — Emergency Department (HOSPITAL_COMMUNITY)
Admission: EM | Admit: 2015-10-11 | Discharge: 2015-10-11 | Disposition: A | Discharge status: Home / Self Care | Payer: Managed Care, Other (non HMO) | Attending: Emergency Medicine | Admitting: Emergency Medicine

## 2015-10-11 ENCOUNTER — Encounter (HOSPITAL_COMMUNITY): Payer: Self-pay | Admitting: Emergency Medicine

## 2015-10-11 DIAGNOSIS — G43809 Other migraine, not intractable, without status migrainosus: Secondary | ICD-10-CM

## 2015-10-11 DIAGNOSIS — Z791 Long term (current) use of non-steroidal anti-inflammatories (NSAID): Secondary | ICD-10-CM | POA: Insufficient documentation

## 2015-10-11 DIAGNOSIS — E039 Hypothyroidism, unspecified: Secondary | ICD-10-CM | POA: Insufficient documentation

## 2015-10-11 DIAGNOSIS — I10 Essential (primary) hypertension: Secondary | ICD-10-CM | POA: Insufficient documentation

## 2015-10-11 DIAGNOSIS — Z79899 Other long term (current) drug therapy: Secondary | ICD-10-CM | POA: Insufficient documentation

## 2015-10-11 MED ORDER — METOCLOPRAMIDE HCL 5 MG/ML IJ SOLN
10.0000 mg | Freq: Once | INTRAMUSCULAR | Status: AC
  Administered 2015-10-11: 10 mg via INTRAVENOUS
  Filled 2015-10-11: qty 2

## 2015-10-11 MED ORDER — KETOROLAC TROMETHAMINE 30 MG/ML IJ SOLN
30.0000 mg | Freq: Once | INTRAMUSCULAR | Status: AC
  Administered 2015-10-11: 30 mg via INTRAVENOUS
  Filled 2015-10-11: qty 1

## 2015-10-11 MED ORDER — DIPHENHYDRAMINE HCL 50 MG/ML IJ SOLN
25.0000 mg | Freq: Once | INTRAMUSCULAR | Status: AC
  Administered 2015-10-11: 25 mg via INTRAVENOUS
  Filled 2015-10-11: qty 1

## 2015-10-11 MED ORDER — SODIUM CHLORIDE 0.9 % IV BOLUS (SEPSIS)
1000.0000 mL | Freq: Once | INTRAVENOUS | Status: AC
  Administered 2015-10-11: 1000 mL via INTRAVENOUS

## 2015-10-11 MED ORDER — DEXAMETHASONE SODIUM PHOSPHATE 10 MG/ML IJ SOLN
10.0000 mg | Freq: Once | INTRAMUSCULAR | Status: AC
  Administered 2015-10-11: 10 mg via INTRAVENOUS
  Filled 2015-10-11: qty 1

## 2015-10-11 NOTE — ED Provider Notes (Signed)
[facility] DEPT Provider Note   CSN: [id number] Arrival date & time: 10/11/15  1712     History   Chief Complaint Chief Complaint  Patient presents with  . Migraine    [name] is a 45 y.o. female.  The patient is a 45 year old female, she does have a history of frequent migraine headaches, she also has hypertension, hypothyroidism and is in the process of being worked up for rheumatoid arthritis by her family doctor. She reports that she is currently taking Celebrex for her knee pain, she takes occasional Imitrex when she needs it for her headaches, this current headache started over the last couple of days and has not seemed to resolve with Imitrex. She denies vomiting but does have nausea and photophobia. She denies fevers or stiff neck, she is able to ambulate without any difficulty with coordination or strength in her lower extremities. She states that the pain is located across her forehead, when asked what it feels like she reports that it is sharp, stabbing, pulsating, throbbing, pressure and all other descriptions that she could come up with.    Migraine     Past Medical History:  Diagnosis Date  . Headache(784.0)    migraines  . Hypertension   . Hypothyroidism     Patient Active Problem List   Diagnosis Date Noted  . Chronic migraine without aura without status migrainosus, not intractable 08/22/2015  . Abdominal pain, epigastric 07/03/2013  . Acute kidney injury ([place]) 10/20/2011  . Nausea & vomiting 10/19/2011  . Suprapubic abdominal pain 10/19/2011  . Dehydration 10/19/2011  . Hypothyroidism 10/19/2011  . Migraines 10/19/2011    Past Surgical History:  Procedure Laterality Date  . TUBAL LIGATION    . uterine ablation    . uterine polyps      OB History    Gravida Para Term Preterm AB Living             0   SAB TAB Ectopic Multiple Live Births                   Home Medications    Prior to Admission medications   Medication  Sig Start Date End Date Taking? Authorizing Provider  amitriptyline (ELAVIL) 10 MG tablet Take 1 tablet (10 mg total) by mouth at bedtime. 10/10/15   Melvenia Beam, MD  divalproex (DEPAKOTE ER) 500 MG 24 hr tablet Take 1 tablet (500 mg total) by mouth daily. 08/18/15   Melvenia Beam, MD  FEVERFEW PO Take 1 Dose by mouth daily.    Historical Provider, MD  ibuprofen (ADVIL,MOTRIN) 200 MG tablet Take 800 mg by mouth every 6 (six) hours as needed for headache or mild pain.    Historical Provider, MD  levothyroxine (SYNTHROID, LEVOTHROID) 175 MCG tablet Take 175 mcg by mouth at bedtime.     Historical Provider, MD  Multiple Vitamin (MULTIVITAMIN WITH MINERALS) TABS tablet Take 1 tablet by mouth at bedtime.    Historical Provider, MD  ondansetron (ZOFRAN ODT) 4 MG disintegrating tablet Take 1-2 tablets (4 mg total) by mouth every 8 (eight) hours as needed 08/18/15   Melvenia Beam, MD  SUMAtriptan (IMITREX) 100 MG tablet Take 100 mg by mouth every 2 (two) hours as needed for migraine.     Historical Provider, MD  SUMAtriptan (IMITREX) 6 MG/0.5ML SOLN injection Inject 6 mg into the skin every 2 (two) hours as needed for migraine or headache. May repeat in 2 hours if headache  persists or recurs.    Historical Provider, MD  tiZANidine (ZANAFLEX) 4 MG tablet Take 1 tablet (4 mg total) by mouth every 6 (six) hours as needed for muscle spasms (spasms). 09/30/15   Melvenia Beam, MD  vitamin C (ASCORBIC ACID) 500 MG tablet Take 500 mg by mouth at bedtime.    Historical Provider, MD  zolpidem (AMBIEN) 10 MG tablet Take 10 mg by mouth at bedtime.     Historical Provider, MD    Family History Family History  Problem Relation Age of Onset  . Migraines Mother   . Migraines Other     Social History Social History  Substance Use Topics  . Smoking status: Never Smoker  . Smokeless tobacco: Never Used  . Alcohol use Yes     Comment: social     Allergies   Review of patient's allergies indicates no known  allergies.   Review of Systems Review of Systems  All other systems reviewed and are negative.    Physical Exam Updated Vital Signs BP 96/69 (BP Location: Left Arm)   Pulse 66   Temp 98 F (36.7 C) (Oral)   Resp 16   Ht 5\' 7"  (1.702 m)   Wt 130 lb (59 kg)   SpO2 100%   BMI 20.36 kg/m   Physical Exam  Constitutional: She appears well-developed and well-nourished. No distress.  HENT:  Head: Normocephalic and atraumatic.  Mouth/Throat: Oropharynx is clear and moist. No oropharyngeal exudate.  No tenderness over the temporal artery bilaterally  Eyes: Conjunctivae and EOM are normal. Pupils are equal, round, and reactive to light. Right eye exhibits no discharge. Left eye exhibits no discharge. No scleral icterus.  Neck: Normal range of motion. Neck supple. No JVD present. No thyromegaly present.  Cardiovascular: Normal rate, regular rhythm, normal heart sounds and intact distal pulses.  Exam reveals no gallop and no friction rub.   No murmur heard. Pulmonary/Chest: Effort normal and breath sounds normal. No respiratory distress. She has no wheezes. She has no rales.  Abdominal: Soft. Bowel sounds are normal. She exhibits no distension and no mass. There is no tenderness.  Musculoskeletal: Normal range of motion. She exhibits no edema or tenderness.  Lymphadenopathy:    She has no cervical adenopathy.  Neurological: She is alert. Coordination normal.  Normal strength in all 4 extremities, no pronator drift, normal gait, normal speech, normal memory, normal level of alertness  Skin: Skin is warm and dry. No rash noted. No erythema.  Psychiatric: She has a normal mood and affect. Her behavior is normal.  Nursing note and vitals reviewed.    ED Treatments / Results  Labs (all labs ordered are listed, but only abnormal results are displayed) Labs Reviewed - No data to display  EKG  EKG Interpretation None       Radiology No results found.  Procedures Procedures  (including critical care time)  Medications Ordered in ED Medications  ketorolac (TORADOL) 30 MG/ML injection 30 mg (not administered)  metoCLOPramide (REGLAN) injection 10 mg (not administered)  diphenhydrAMINE (BENADRYL) injection 25 mg (not administered)  dexamethasone (DECADRON) injection 10 mg (not administered)  sodium chloride 0.9 % bolus 1,000 mL (not administered)     Initial Impression / Assessment and Plan / ED Course  I have reviewed the triage vital signs and the nursing notes.  Pertinent labs & imaging results that were available during my care of the patient were reviewed by me and considered in my medical decision making (see chart  for details).  Clinical Course    The patient is well-appearing, she will receive some fluids and some medications to treat her migraine. Avoid opiates, anticipate discharge.  The patient is improved significantly with medications as above, no more headache, stable for discharge  Final Clinical Impressions(s) / ED Diagnoses   Final diagnoses:  Other migraine without status migrainosus, not intractable    New Prescriptions New Prescriptions   No medications on file     Noemi Chapel, MD 10/11/15 2038

## 2015-10-11 NOTE — ED Triage Notes (Signed)
Pt called for Triage, no answer. 

## 2015-10-11 NOTE — ED Triage Notes (Signed)
Pt reports migraine that started last night. Pt also reports emesis. Medications not working.

## 2015-10-11 NOTE — Discharge Instructions (Signed)
Return if symptoms worsen

## 2015-10-11 NOTE — ED Notes (Signed)
Pt alert & oriented x4, stable gait. Patient  given discharge instructions, paperwork & prescription(s). Patient verbalized understanding. Pt left department w/ no further questions. 

## 2015-10-26 ENCOUNTER — Telehealth: Payer: Self-pay | Admitting: Neurology

## 2015-10-26 MED ORDER — DULOXETINE HCL 20 MG PO CPEP: 20.0000 mg | ORAL_CAPSULE | Freq: Every day | ORAL | 3 refills | Status: DC

## 2015-10-26 NOTE — Telephone Encounter (Signed)
Dr Brett Fairy- you are listed as the WID this am. Are you comfortable calling in this medication. Patient last seen  08/18/15 by AA, MD. She did discuss starting cymbalta. See phone note from 10/07/15. Dr Jaynee Eagles started her on amitriptyline.

## 2015-10-26 NOTE — Telephone Encounter (Signed)
Patient is calling. She would like to start taking Cymbalta as discussed previously. Please call to [facility] in [place]. Please call and discuss.

## 2015-10-26 NOTE — Telephone Encounter (Signed)
Wrote per patient's request for cymbalta 20 mg once a day.  CC to primary neurologist. CD

## 2015-10-26 NOTE — Addendum Note (Signed)
Addended by: Larey Seat on: 10/26/2015 04:03 PM   Modules accepted: Orders

## 2015-11-03 ENCOUNTER — Telehealth: Payer: Self-pay | Admitting: Neurology

## 2015-11-03 NOTE — Telephone Encounter (Signed)
Called and spoke to pt. She has had migraine since Sunday. She had tried oral imitrex, injectable imitrex, and tizanidine. Nothing has provided relief. She last took imitrex injection this morning with no relief. She is going to have her mother drive her to get infusion. She will be here within an hour per patient. Verified pt allergies.  She went to ED on 10/11/15 for migraine infusion also.

## 2015-11-03 NOTE — Telephone Encounter (Signed)
Patient is calling. She has had a migraine since this past Sunday and wants to know if she can get an infusion. Please call and advise.

## 2015-11-03 NOTE — Telephone Encounter (Signed)
Dr Ahern- FYI 

## 2015-11-18 ENCOUNTER — Ambulatory Visit: Payer: Self-pay | Admitting: Neurology

## 2015-11-18 ENCOUNTER — Ambulatory Visit (INDEPENDENT_AMBULATORY_CARE_PROVIDER_SITE_OTHER): Payer: Self-pay | Admitting: Neurology

## 2015-11-18 ENCOUNTER — Encounter: Payer: Self-pay | Admitting: Neurology

## 2015-11-18 VITALS — BP 120/65 | HR 81 | Ht 67.0 in | Wt 132.8 lb

## 2015-11-18 DIAGNOSIS — G43709 Chronic migraine without aura, not intractable, without status migrainosus: Secondary | ICD-10-CM

## 2015-11-18 MED ORDER — AMITRIPTYLINE HCL 10 MG PO TABS: 30.0000 mg | ORAL_TABLET | Freq: Every day | ORAL | 6 refills | Status: DC

## 2015-11-18 NOTE — Progress Notes (Signed)
[facility]    Provider:  Dr Jaynee Eagles Referring Provider: Sinda Du, MD Primary Care Physician:  Alonza Bogus, MD  CC:  Migrained  Interval history 11/18/2015: She is on the cymbalta, amitriptyline, mood is better and she is sleeping well. She is still on the topiramate. She has had 12 migraines this month and at least 20 headache days.   HPI:  Martha Aguilar is a 45 y.o. female here as a referral from Dr. Luan Pulling for migraines. Chronic since 1990. Here for second opinion. She has been going to see Dr. Sima Matas in [place] at the [facility]. Worsening in the setting of a divorce. Started at the age of 20. Mother had migraines. She has been seeing Dr. Sima Matas for many years. She has pounding and pressure in the middle of the head, sometimes more on the right. She gets migraines 2 a week at least and they can last several days. She takes an imitrex shot for acute management. Triggers are wine, weather, exhaustion. Her sleeping is better now. She has been on [place] for 10 years. She takes advil every day. Migraines have lasted 3 days. She has light sensitivity, sound sensitivity, has to lay still in a dark room,  no nausea or vomiting. No aura. They come on very quickly. She is only on synthroid and ambien. She feels a lot better since discontinuing all her medications. She has tried multiple medications. She has been getting botox for migraine for at least 5 years which has tremendously helped she used to have daily migraines. She has at least 15 days a month of headache of which at least 8 are migrainous. No medication overuse. She gets good benefit from botox. Migraines can be severe. No aura.   Reviewed notes, labs and imaging from outside physicians, which showed:   Patient is here for second opinion. She is to Dr. Claudie Fisherman in [place]. As of last note, She has 8 severe headaches in a month, and severity is severe. Location is frontal. The  headaches last for 12 hours. Pain is described as pressure-like and tight. Symptoms have been associated with sensitivity to light, sensitivity to noise and worse with movement activities. Denies nausea, vomiting, odor sensitivity or dizziness. Headache triggers include alcohol, fatigue, sleep deprivation, whether, periods of sinus problems. Since Botox patient reduced her headaches from almost daily to recently 16 headache free days and a 20 headache free days last visit with Dr. Sima Matas. She's been getting Botox for several years. She is under stress with recent separation. Her BP was elevated so she was prescribed lisinopril and hydrochlorothiazide. Imitrex has not been as effective lately and she would be interested in trying Maxalt. Vitamins especially CoQ10 seem to help. She's had piercings and she tried taking neuro device. She's been told to consider hysterectomy to decrease the headaches.  Past medications and past medications include: Excedrin and Ultram, D.H.E. capsule, DHE injection RV, Relpax and Zomig tablets, Imitrex. Zonegran. Reglan and Zofran. Flexeril. Prednisone. Effexor, Pristiq, Wellbutrin, Cymbalta. Procedures Botox. Atenolol. On Cymbalta, Neurontin, lisinopril, Zanaflex, Topamax 300 mg by mouth daily. Took amitriptyline, nortriptyline. Relpax.   LDL 99, BUN 18, creatinine 0.8, TSH 0.6. BUN and creatinine were drawn in July 2017.  Review of Systems: Patient complains of symptoms per HPI as well as the following symptoms: no cp, no sob, no hx of cardiac problems. Pertinent negatives per HPI. All others negative.  Social History   Social History  . Marital status: Legally Separated  Spouse name: N/A  . Number of children: 1  . Years of education: 37   Occupational History  . Nurse    Social History Main Topics  . Smoking status: Never Smoker  . Smokeless tobacco: Never Used  . Alcohol use Yes     Comment: social  . Drug use: No  . Sexual activity: Not on file    Other Topics Concern  . Not on file   Social History Narrative   Lives with daughter   Caffeine use: rare    Family History  Problem Relation Age of Onset  . Migraines Mother   . Migraines Other     Past Medical History:  Diagnosis Date  . Headache(784.0)    migraines  . Hypertension   . Hypothyroidism     Past Surgical History:  Procedure Laterality Date  . TUBAL LIGATION    . uterine ablation    . uterine polyps      Current Outpatient Prescriptions  Medication Sig Dispense Refill  . amitriptyline (ELAVIL) 10 MG tablet Take 1 tablet (10 mg total) by mouth at bedtime. 30 tablet 6  . atenolol (TENORMIN) 25 MG tablet Take 25 mg by mouth daily.  5  . celecoxib (CELEBREX) 200 MG capsule Take 200 mg by mouth daily.    . DULoxetine (CYMBALTA) 20 MG capsule Take 1 capsule (20 mg total) by mouth daily. 30 capsule 3  . FEVERFEW PO Take 1 Dose by mouth daily.    Marland Kitchen ibuprofen (ADVIL,MOTRIN) 200 MG tablet Take 800 mg by mouth every 6 (six) hours as needed for headache or mild pain.    Marland Kitchen levothyroxine (SYNTHROID, LEVOTHROID) 150 MCG tablet Take 150 mcg by mouth daily before breakfast.    . Multiple Vitamin (MULTIVITAMIN WITH MINERALS) TABS tablet Take 1 tablet by mouth at bedtime.    . ondansetron (ZOFRAN ODT) 4 MG disintegrating tablet Take 1-2 tablets (4 mg total) by mouth every 8 (eight) hours as needed 30 tablet 12  . SUMAtriptan (IMITREX) 100 MG tablet Take 100 mg by mouth every 2 (two) hours as needed for migraine.     . SUMAtriptan (IMITREX) 6 MG/0.5ML SOLN injection Inject 6 mg into the skin every 2 (two) hours as needed for migraine or headache. May repeat in 2 hours if headache persists or recurs.    Marland Kitchen tiZANidine (ZANAFLEX) 4 MG tablet Take 1 tablet (4 mg total) by mouth every 6 (six) hours as needed for muscle spasms (spasms). 90 tablet 1  . topiramate (TOPAMAX) 100 MG tablet Take 300 mg by mouth daily.     . vitamin C (ASCORBIC ACID) 500 MG tablet Take 500 mg by mouth  at bedtime.    Marland Kitchen zolpidem (AMBIEN) 10 MG tablet Take 10 mg by mouth at bedtime.      No current facility-administered medications for this visit.     Allergies as of 11/18/2015  . (No Known Allergies)    Vitals: There were no vitals taken for this visit. Last Weight:  Wt Readings from Last 1 Encounters:  10/11/15 130 lb (59 kg)   Last Height:   Ht Readings from Last 1 Encounters:  10/11/15 5\' 7"  (1.702 m)    Physical exam: Exam: Gen: NAD, conversant, well nourised, obese, well groomed                     CV: RRR, no MRG. No Carotid Bruits. No peripheral edema, warm, nontender Eyes: Conjunctivae clear without exudates or  hemorrhage  Neuro: Detailed Neurologic Exam  Speech:    Speech is normal; fluent and spontaneous with normal comprehension.  Cognition:    The patient is oriented to person, place, and time;     recent and remote memory intact;     language fluent;     normal attention, concentration,     fund of knowledge Cranial Nerves:    The pupils are equal, round, and reactive to light. The fundi are normal and spontaneous venous pulsations are present. Visual fields are full to finger confrontation. Extraocular movements are intact. Trigeminal sensation is intact and the muscles of mastication are normal. The face is symmetric. The palate elevates in the midline. Hearing intact. Voice is normal. Shoulder shrug is normal. The tongue has normal motion without fasciculations.   Coordination:    Normal finger to nose and heel to shin. Normal rapid alternating movements.   Gait:    Heel-toe and tandem gait are normal.   Motor Observation:    No asymmetry, no atrophy, and no involuntary movements noted. Tone:    Normal muscle tone.    Posture:    Posture is normal. normal erect    Strength:    Strength is V/V in the upper and lower limbs.      Sensation: intact to LT     Reflex Exam:  DTR's:    Deep tendon reflexes in the upper and lower  extremities are normal bilaterally.   Toes:    The toes are downgoing bilaterally.   Clonus:    Clonus is absent.     Assessment/Plan:  45 year old with chronic migraines without aura without status migrainosus not intractable.  Remember to drink plenty of fluid, eat healthy meals and do not skip any meals. Try to eat protein with every meal and eat a healthy snack such as fruit or nuts in between meals. Try to keep a regular sleep-wake schedule and try to exercise daily, particularly in the form of walking, 20-30 minutes a day, if you can.   As far as your medications are concerned, I would like to suggest: Increase Amitriptyline to 30mg  before bed. Can further increase if needed. Will change IR to Quedexy if possible.   I would like to see you back for botox, sooner if we need to. Please call us with any interim questions, concerns, problems, updates or refill requests.   Our phone number is [phone]. We also have an after hours call service for urgent matters and there is a physician on-call for urgent questions. For any emergencies you know to call 911 or go to the nearest emergency room   Discussed CGRP for migraine. Cephaly. Sphenocath.   Acute management: Imitex injectable. Try combining with zofran and an NSAID  I would like to see you back in 3 months, sooner if we need to. Please call us with any interim questions, concerns, problems, updates or refill requests.   Our phone number is [phone]. We also have an after hours call service for urgent matters and there is a physician on-call for urgent questions. For any emergencies you know to call 911 or go to the nearest emergency room  Discussed side effects of depakote as per patient instructions, discussed risk of severe birth defects, do not get pregnant, use double backup.  Sarina Ill, MD  [facility] [address] [place], [place] [address]  Phone [phone] Fax  [phone]  A total of 15 minutes was spent face-to-face with this  patient. Over half this time was spent on counseling patient on the migraine diagnosis and different diagnostic and therapeutic options available.

## 2015-11-18 NOTE — Patient Instructions (Signed)
As far as your medications are concerned, I would like to suggest: Increase Amitriptyline to 30mg  before bed. Can further increase if needed  I would like to see you back for botox, sooner if we need to. Please call us with any interim questions, concerns, problems, updates or refill requests.   Our phone number is [phone]. We also have an after hours call service for urgent matters and there is a physician on-call for urgent questions. For any emergencies you know to call 911 or go to the nearest emergency room

## 2015-11-22 ENCOUNTER — Telehealth: Payer: Self-pay | Admitting: Neurology

## 2015-11-25 ENCOUNTER — Other Ambulatory Visit: Payer: Self-pay | Admitting: *Deleted

## 2015-11-25 MED ORDER — TIZANIDINE HCL 4 MG PO TABS: 4.0000 mg | ORAL_TABLET | Freq: Four times a day (QID) | ORAL | 5 refills | Status: DC | PRN

## 2015-11-30 ENCOUNTER — Telehealth: Payer: Self-pay | Admitting: Neurology

## 2015-11-30 ENCOUNTER — Other Ambulatory Visit: Payer: Self-pay | Admitting: *Deleted

## 2015-11-30 MED ORDER — TOPIRAMATE ER 150 MG PO SPRINKLE CAP24: EXTENDED_RELEASE_CAPSULE | ORAL | 11 refills | Status: DC

## 2015-11-30 NOTE — Telephone Encounter (Signed)
Dr. Jaynee Eagles has provided patient with 30-days of samples, 30-day savings card and a new prescription.  She is aware that these things have been placed up front for pick up (aware of holiday hours).

## 2015-11-30 NOTE — Telephone Encounter (Signed)
Pt says Dr A was going to get topamax 100mg  ER samples for her. She was checking back. Please call

## 2015-12-06 ENCOUNTER — Telehealth: Payer: Self-pay | Admitting: Neurology

## 2015-12-06 NOTE — Telephone Encounter (Signed)
Spoke with patient who stated that she currently has no insurance coverage. She told me that she would call back when she finds out the information for her new insurance, we are leaving botox apt scheduled as of now but will cancel within the next few days if she still hasnt heard anything.

## 2015-12-07 NOTE — Telephone Encounter (Signed)
The patient called back today and we spoke about her insurance, she said that she found out if [place] and she gave me a contact number for someone listed with them but she did not have an ID number.

## 2015-12-08 ENCOUNTER — Telehealth: Payer: Self-pay | Admitting: Neurology

## 2015-12-08 NOTE — Telephone Encounter (Signed)
Pt called inquiring if PA was approved for appt tomorrow. Danielle was skyped and advised she was working on [place] but the appt would have to be r/s. Pt advised she would want to schedule when it is authorized

## 2015-12-09 ENCOUNTER — Ambulatory Visit: Payer: Self-pay | Admitting: Neurology

## 2015-12-20 ENCOUNTER — Other Ambulatory Visit: Payer: Self-pay | Admitting: Neurology

## 2015-12-20 ENCOUNTER — Telehealth: Payer: Self-pay | Admitting: Neurology

## 2015-12-20 MED ORDER — AMITRIPTYLINE HCL 50 MG PO TABS: 50.0000 mg | ORAL_TABLET | Freq: Every day | ORAL | 6 refills | Status: DC

## 2015-12-20 NOTE — Telephone Encounter (Signed)
Dr Jaynee Eagles- you last saw patient on 11/18/15. Per last office note you stated ": Increase Amitriptyline to 30mg  before bed. Can increase if needed". Please advise

## 2015-12-20 NOTE — Telephone Encounter (Signed)
Patient is calling stating an increase in amitriptyline (ELAVIL) 10 MG tablet still has not helped with sleep.

## 2015-12-20 NOTE — Telephone Encounter (Signed)
Called and spoke to pt. She would like to try and increase to 50mg  at night first and see if this helps with her insomnia. She requested new rx be called into her pharmacy. Advised  We will do that. She will call back if she experiences any side effects.

## 2015-12-20 NOTE — Telephone Encounter (Signed)
Please increase to 50mg  at night as long as no side effects. Otherwise if she has insomnia I also recommend insomnia counselors and I can give her some suggestions thanks

## 2016-02-10 ENCOUNTER — Other Ambulatory Visit: Payer: Self-pay | Admitting: Neurology

## 2016-03-15 ENCOUNTER — Other Ambulatory Visit: Payer: Self-pay | Admitting: Neurology

## 2016-03-15 ENCOUNTER — Telehealth: Payer: Self-pay | Admitting: Neurology

## 2016-03-15 NOTE — Telephone Encounter (Signed)
Patient called office in reference to insurance benefits for botox.  Please call

## 2016-03-16 NOTE — Telephone Encounter (Signed)
Patient calling back. Has BCBS ID# [id number], GRP# [id number] and is in her name. She has appointment with Dr. Jaynee Eagles on 04-06-16 and wants to know if she can have Botox then.

## 2016-03-16 NOTE — Telephone Encounter (Signed)
I called the patient back but she did not answer, I left a VM asking for her to call me. The plan she had given [name] saying that they have no record of her being a member. She did not have an ID number when she gave me the plan and they cant find her. If she calls back would you mind asking her who she has active coverage through and what her ID number is?

## 2016-03-20 ENCOUNTER — Encounter (HOSPITAL_COMMUNITY): Payer: Self-pay | Admitting: Cardiology

## 2016-03-20 ENCOUNTER — Emergency Department (HOSPITAL_COMMUNITY)
Admission: EM | Admit: 2016-03-20 | Discharge: 2016-03-20 | Disposition: A | Discharge status: Home / Self Care | Payer: BLUE CROSS/BLUE SHIELD | Attending: Emergency Medicine | Admitting: Emergency Medicine

## 2016-03-20 DIAGNOSIS — E039 Hypothyroidism, unspecified: Secondary | ICD-10-CM | POA: Insufficient documentation

## 2016-03-20 DIAGNOSIS — Z79899 Other long term (current) drug therapy: Secondary | ICD-10-CM | POA: Insufficient documentation

## 2016-03-20 DIAGNOSIS — I10 Essential (primary) hypertension: Secondary | ICD-10-CM | POA: Insufficient documentation

## 2016-03-20 DIAGNOSIS — G43109 Migraine with aura, not intractable, without status migrainosus: Secondary | ICD-10-CM | POA: Insufficient documentation

## 2016-03-20 DIAGNOSIS — R51 Headache: Secondary | ICD-10-CM | POA: Diagnosis not present

## 2016-03-20 MED ORDER — DIPHENHYDRAMINE HCL 50 MG/ML IJ SOLN
25.0000 mg | Freq: Once | INTRAMUSCULAR | Status: AC
  Administered 2016-03-20: 25 mg via INTRAVENOUS
  Filled 2016-03-20: qty 1

## 2016-03-20 MED ORDER — METOCLOPRAMIDE HCL 5 MG/ML IJ SOLN
10.0000 mg | Freq: Once | INTRAMUSCULAR | Status: AC
  Administered 2016-03-20: 10 mg via INTRAVENOUS
  Filled 2016-03-20: qty 2

## 2016-03-20 MED ORDER — KETOROLAC TROMETHAMINE 30 MG/ML IJ SOLN
30.0000 mg | Freq: Once | INTRAMUSCULAR | Status: AC
  Administered 2016-03-20: 30 mg via INTRAVENOUS
  Filled 2016-03-20: qty 1

## 2016-03-20 NOTE — Discharge Instructions (Signed)
Follow-up with your primary doctor for recheck °

## 2016-03-20 NOTE — ED Provider Notes (Signed)
[place] DEPT Provider Note   CSN: [id number] Arrival date & time: 03/20/16  1031  By signing my name below, I, Collene Leyden, attest that this documentation has been prepared under the direction and in the presence of Kristal Perl PA-C.  Electronically Signed: Collene Leyden, Scribe. 03/20/16. 5:07 PM.  History   Chief Complaint Chief Complaint  Patient presents with  . Migraine   HPI Comments: Martha Aguilar is a 46 y.o. female with a history of migraines, who presents to the Emergency Department complaining of  Gradual onset of progressing frontal headache that began one day prior to arrival. Patient states her symptoms are similar to prior migraine episodes. Patient reports associated radiation to the temples, photophobia, and sensitivity to sound and nausea. Patient reports taking imtrex and Tazidime with no relief. Patient reports she usually gets 10 migraines a month. Patient denies weakness to the extremities, head injury, vomiting, fever, neck pain or stiffness, visual changes dizziness   The history is provided by the patient. No language interpreter was used.    Past Medical History:  Diagnosis Date  . Headache(784.0)    migraines  . Hypertension   . Hypothyroidism     Patient Active Problem List   Diagnosis Date Noted  . Chronic migraine without aura without status migrainosus, not intractable 08/22/2015  . Abdominal pain, epigastric 07/03/2013  . Acute kidney injury ([place]) 10/20/2011  . Nausea & vomiting 10/19/2011  . Suprapubic abdominal pain 10/19/2011  . Dehydration 10/19/2011  . Hypothyroidism 10/19/2011  . Migraines 10/19/2011    Past Surgical History:  Procedure Laterality Date  . TUBAL LIGATION    . uterine ablation    . uterine polyps      OB History    Gravida Para Term Preterm AB Living             0   SAB TAB Ectopic Multiple Live Births                   Home Medications    Prior to Admission medications   Medication Sig  Start Date End Date Taking? Authorizing Provider  amitriptyline (ELAVIL) 50 MG tablet Take 1 tablet (50 mg total) by mouth at bedtime. 12/20/15   Melvenia Beam, MD  atenolol (TENORMIN) 25 MG tablet Take 25 mg by mouth daily. 09/13/15   Historical Provider, MD  celecoxib (CELEBREX) 200 MG capsule Take 200 mg by mouth daily.    Historical Provider, MD  DULoxetine (CYMBALTA) 20 MG capsule Take 1 capsule (20 mg total) by mouth daily. 10/26/15   Larey Seat, MD  FEVERFEW PO Take 1 Dose by mouth daily.    Historical Provider, MD  ibuprofen (ADVIL,MOTRIN) 200 MG tablet Take 800 mg by mouth every 6 (six) hours as needed for headache or mild pain.    Historical Provider, MD  levothyroxine (SYNTHROID, LEVOTHROID) 150 MCG tablet Take 150 mcg by mouth daily before breakfast.    Historical Provider, MD  Multiple Vitamin (MULTIVITAMIN WITH MINERALS) TABS tablet Take 1 tablet by mouth at bedtime.    Historical Provider, MD  ondansetron (ZOFRAN ODT) 4 MG disintegrating tablet Take 1-2 tablets (4 mg total) by mouth every 8 (eight) hours as needed 08/18/15   Melvenia Beam, MD  SUMAtriptan (IMITREX) 100 MG tablet Take 100 mg by mouth every 2 (two) hours as needed for migraine.     Historical Provider, MD  SUMAtriptan (IMITREX) 6 MG/0.5ML SOLN injection Inject 6 mg into the skin every 2 (  two) hours as needed for migraine or headache. May repeat in 2 hours if headache persists or recurs.    Historical Provider, MD  SUMAtriptan 6 MG/0.5ML SOAJ INJECT 6MLS INTO THE SKIN AS NEEDED MAY REPEAT IN 2 HOURS MAX 2/24 03/15/16   Melvenia Beam, MD  tiZANidine (ZANAFLEX) 4 MG tablet Take 1 tablet (4 mg total) by mouth every 6 (six) hours as needed for muscle spasms (spasms). 11/25/15   Melvenia Beam, MD  Topiramate ER 150 MG CS24 Take two capsules at night. One month supply of samples provided. 11/30/15   Melvenia Beam, MD  vitamin C (ASCORBIC ACID) 500 MG tablet Take 500 mg by mouth at bedtime.    Historical Provider, MD     Family History Family History  Problem Relation Age of Onset  . Migraines Mother   . Migraines Other     Social History Social History  Substance Use Topics  . Smoking status: Never Smoker  . Smokeless tobacco: Never Used  . Alcohol use Yes     Comment: social     Allergies   Patient has no known allergies.   Review of Systems Review of Systems  Constitutional: Negative for activity change, appetite change and fever.  HENT: Negative for facial swelling and trouble swallowing.   Eyes: Positive for photophobia. Negative for pain and visual disturbance.  Respiratory: Negative for shortness of breath.   Cardiovascular: Negative for chest pain.  Gastrointestinal: Negative for diarrhea, nausea and vomiting.  Musculoskeletal: Negative for neck pain and neck stiffness.  Skin: Negative for rash and wound.  Neurological: Positive for headaches. Negative for dizziness, syncope, facial asymmetry, speech difficulty, weakness and numbness.  Psychiatric/Behavioral: Negative for confusion and decreased concentration.  All other systems reviewed and are negative.    Physical Exam Updated Vital Signs BP 99/64 (BP Location: Right Arm)   Pulse 79   Temp 97.6 F (36.4 C) (Oral)   Resp 16   SpO2 97%   Physical Exam  Constitutional: She is oriented to person, place, and time. She appears well-developed. No distress.  uncomfortable appearing  HENT:  Head: Normocephalic and atraumatic.  Mouth/Throat: Oropharynx is clear and moist.  Eyes: Conjunctivae and EOM are normal. Pupils are equal, round, and reactive to light.  Neck: Normal range of motion, full passive range of motion without pain and phonation normal. Neck supple. No Kernig's sign noted.  Cardiovascular: Normal rate, regular rhythm and normal heart sounds.   No murmur heard. Pulmonary/Chest: Effort normal and breath sounds normal. No respiratory distress.  Musculoskeletal: Normal range of motion.  Neurological: She is  alert and oriented to person, place, and time. No sensory deficit. She exhibits normal muscle tone.  CN II-XII grossly intact.   Skin: Skin is warm and dry.  Psychiatric: She has a normal mood and affect. Thought content normal.  Nursing note and vitals reviewed.    ED Treatments / Results  DIAGNOSTIC STUDIES: Oxygen Saturation is 97% on RA, normal by my interpretation.    COORDINATION OF CARE: 5:07 PM Discussed treatment plan with pt at bedside and pt agreed to plan, which includes a migraine cocktail.   Labs (all labs ordered are listed, but only abnormal results are displayed) Labs Reviewed - No data to display  EKG  EKG Interpretation None       Radiology No results found.  Procedures Procedures (including critical care time)  Medications Ordered in ED Medications - No data to display   Initial Impression /  Assessment and Plan / ED Course  I have reviewed the triage vital signs and the nursing notes.  Pertinent labs & imaging results that were available during my care of the patient were reviewed by me and considered in my medical decision making (see chart for details).      Vitals stable,  Pt is non-toxic appearing.  No focal neuro deficits, no meningeal signs.  Headache of gradual onset that is similar to previous.    On recheck, pt is feeling better, headache resolved. States she is ready for d/c.  Return precautions discussed.   Final Clinical Impressions(s) / ED Diagnoses   Final diagnoses:  Migraine with aura and without status migrainosus, not intractable    New Prescriptions New Prescriptions   No medications on file   I personally performed the services described in this documentation, which was scribed in my presence. The recorded information has been reviewed and is accurate.     Bufford Lope 03/22/16 2054    Virgel Manifold, MD 03/28/16 [phone]

## 2016-03-20 NOTE — ED Triage Notes (Signed)
Migraine headache since yesterday.

## 2016-03-22 NOTE — Telephone Encounter (Signed)
I called and had insurance verified and obtained authorizations. I ordered medication also.   I called the patient to let her know but she did not answer so I left a VM asking for her to call me back.

## 2016-04-05 DIAGNOSIS — G43709 Chronic migraine without aura, not intractable, without status migrainosus: Secondary | ICD-10-CM | POA: Diagnosis not present

## 2016-04-06 ENCOUNTER — Encounter: Payer: Self-pay | Admitting: Neurology

## 2016-04-06 ENCOUNTER — Ambulatory Visit (INDEPENDENT_AMBULATORY_CARE_PROVIDER_SITE_OTHER): Payer: BLUE CROSS/BLUE SHIELD | Admitting: Neurology

## 2016-04-06 VITALS — BP 119/80 | HR 88 | Ht 67.0 in | Wt 142.6 lb

## 2016-04-06 DIAGNOSIS — G43709 Chronic migraine without aura, not intractable, without status migrainosus: Secondary | ICD-10-CM

## 2016-04-06 MED ORDER — TOPIRAMATE ER 150 MG PO SPRINKLE CAP24: 300.0000 mg | EXTENDED_RELEASE_CAPSULE | Freq: Every day | ORAL | 12 refills | Status: DC

## 2016-04-06 MED ORDER — TRAMADOL HCL 50 MG PO TABS: 50.0000 mg | ORAL_TABLET | Freq: Four times a day (QID) | ORAL | 5 refills | Status: DC | PRN

## 2016-04-06 MED ORDER — PROPRANOLOL HCL ER 60 MG PO CP24: 60.0000 mg | ORAL_CAPSULE | Freq: Every day | ORAL | 11 refills | Status: DC

## 2016-04-06 NOTE — Progress Notes (Signed)
She has at least 8 migraines a month. They can last all day. She can have 18 headache days a month.    Consent Form Botulism Toxin Injection For Chronic Migraine  Botulism toxin has been approved by the Federal drug administration for treatment of chronic migraine. Botulism toxin does not cure chronic migraine and it may not be effective in some patients.  The administration of botulism toxin is accomplished by injecting a small amount of toxin into the muscles of the neck and head. Dosage must be titrated for each individual. Any benefits resulting from botulism toxin tend to wear off after 3 months with a repeat injection required if benefit is to be maintained. Injections are usually done every 3-4 months with maximum effect peak achieved by about 2 or 3 weeks. Botulism toxin is expensive and you should be sure of what costs you will incur resulting from the injection.  The side effects of botulism toxin use for chronic migraine may include:   -Transient, and usually mild, facial weakness with facial injections  -Transient, and usually mild, head or neck weakness with head/neck injections  -Reduction or loss of forehead facial animation due to forehead muscle              weakness  -Eyelid drooping  -Dry eye  -Pain at the site of injection or bruising at the site of injection  -Double vision  -Potential unknown long term risks  Contraindications: You should not have Botox if you are pregnant, nursing, allergic to albumin, have an infection, skin condition, or muscle weakness at the site of the injection, or have myasthenia gravis, Lambert-Eaton syndrome, or ALS.  It is also possible that as with any injection, there may be an allergic reaction or no effect from the medication. Reduced effectiveness after repeated injections is sometimes seen and rarely infection at the injection site may occur. All care will be taken to prevent these side effects. If therapy is given over a long time,  atrophy and wasting in the muscle injected may occur. Occasionally the patient's become refractory to treatment because they develop antibodies to the toxin. In this event, therapy needs to be modified.  I have read the above information and consent to the administration of botulism toxin.    ______________  _____   _________________  Patient signature     Date   Witness signature       BOTOX PROCEDURE NOTE FOR MIGRAINE HEADACHE    Contraindications and precautions discussed with patient(above). Aseptic procedure was observed and patient tolerated procedure. Procedure performed by Dr. Georgia Dom  The condition has existed for more than 6 months, and pt does not have a diagnosis of ALS, Myasthenia Gravis or Lambert-Eaton Syndrome. Risks and benefits of injections discussed and pt agrees to proceed with the procedure. Written consent obtained  These injections are medically necessary. He receives good benefits from these injections. These injections do not cause sedations or hallucinations which the oral therapies may cause.  Indication/Diagnosis: chronic migraine BOTOX(J0585) injection was performed according to protocol by [employer]. 200 units of BOTOX was dissolved into 4 cc NS.  Botox 100 units/vial x 2 vials from [place] [phone] Lot [id number] Exp 10 2020  Diluted in 4 ml of Bacteriostatic 0.9% NaCl NDC 6606-3016-01 Lot [id number] Exp [id number]  Topical pain reliever applied to injection areas w/ gauze Lidocaine 5% cream NDC 32202-542-70 Lot [id number] Exp 06/19   Description of procedure:  The patient was placed in a  sitting position. The standard protocol was used for Botox as follows, with 5 units of Botox injected at each site:   -Procerus muscle, midline injection  -Corrugator muscle, bilateral injection  -Frontalis muscle, bilateral injection, with 2 sites each side, medial injection was performed in the upper one third of the  frontalis muscle, in the region vertical from the medial inferior edge of the superior orbital rim. The lateral injection was again in the upper one third of the forehead vertically above the lateral limbus of the cornea, 1.5 cm lateral to the medial injection site.  -Temporalis muscle injection, 4 sites, bilaterally. The first injection was 3 cm above the tragus of the ear, second injection site was 1.5 cm to 3 cm up from the first injection site in line with the tragus of the ear. The third injection site was 1.5-3 cm forward between the first 2 injection sites. The fourth injection site was 1.5 cm posterior to the second injection site.  -Occipitalis muscle injection, 3 sites, bilaterally. The first injection was done one half way between the occipital protuberance and the tip of the mastoid process behind the ear. The second injection site was done lateral and superior to the first, 1 fingerbreadth from the first injection. The third injection site was 1 fingerbreadth superiorly and medially from the first injection site.  -Cervical paraspinal muscle injection, 2 sites, bilateral knee first injection site was 1 cm from the midline of the cervical spine, 3 cm inferior to the lower border of the occipital protuberance. The second injection site was 1.5 cm superiorly and laterally to the first injection site.  -Trapezius muscle injection was performed at 3 sites, bilaterally. The first injection site was in the upper trapezius muscle halfway between the inflection point of the neck, and the acromion. The second injection site was one half way between the acromion and the first injection site. The third injection was done between the first injection site and the inflection point of the neck.   Will return for repeat injection in 3 months.   A 200 unit sof Botox was used, 155 units were injected, the rest of the Botox was wasted. The patient tolerated the procedure well, there were no complications of  the above procedure.

## 2016-04-06 NOTE — Progress Notes (Signed)
Botox 100 units/vial x 2 vials from [place] [phone] Lot [id number] Exp 10 2020  Diluted in 4 ml of Bacteriostatic 0.9% NaCl NDC 0488-8916-94 Lot [id number] Exp [id number]  Topical pain reliever applied to injection areas w/ gauze Lidocaine 5% cream NDC 00349-179-15 Lot [id number] Exp 06/19

## 2016-04-07 ENCOUNTER — Telehealth: Payer: Self-pay

## 2016-04-07 NOTE — Telephone Encounter (Signed)
PA sent to patients insurance. Will take 3 to 5 business.

## 2016-04-11 NOTE — Telephone Encounter (Signed)
Received faxed [place] notification from [facility]. PA Ref # [id number] approved 04/07/16 - 01/08/2038. T # [id number], F # [id number].

## 2016-04-14 ENCOUNTER — Other Ambulatory Visit: Payer: Self-pay

## 2016-04-14 MED ORDER — TIZANIDINE HCL 4 MG PO TABS: 4.0000 mg | ORAL_TABLET | Freq: Four times a day (QID) | ORAL | 5 refills | Status: DC | PRN

## 2016-04-14 NOTE — Progress Notes (Signed)
Tizanidine refills e-scribed per faxed request from pharmacy.

## 2016-04-26 DIAGNOSIS — F419 Anxiety disorder, unspecified: Secondary | ICD-10-CM | POA: Diagnosis not present

## 2016-04-26 DIAGNOSIS — E039 Hypothyroidism, unspecified: Secondary | ICD-10-CM | POA: Diagnosis not present

## 2016-04-26 DIAGNOSIS — G4700 Insomnia, unspecified: Secondary | ICD-10-CM | POA: Diagnosis not present

## 2016-05-25 ENCOUNTER — Encounter (HOSPITAL_COMMUNITY): Payer: Self-pay

## 2016-05-25 ENCOUNTER — Emergency Department (HOSPITAL_COMMUNITY)
Admission: EM | Admit: 2016-05-25 | Discharge: 2016-05-25 | Disposition: A | Discharge status: Home / Self Care | Payer: BLUE CROSS/BLUE SHIELD | Attending: Emergency Medicine | Admitting: Emergency Medicine

## 2016-05-25 DIAGNOSIS — E039 Hypothyroidism, unspecified: Secondary | ICD-10-CM | POA: Diagnosis not present

## 2016-05-25 DIAGNOSIS — I10 Essential (primary) hypertension: Secondary | ICD-10-CM | POA: Insufficient documentation

## 2016-05-25 DIAGNOSIS — G43009 Migraine without aura, not intractable, without status migrainosus: Secondary | ICD-10-CM | POA: Diagnosis not present

## 2016-05-25 DIAGNOSIS — Z79899 Other long term (current) drug therapy: Secondary | ICD-10-CM | POA: Insufficient documentation

## 2016-05-25 DIAGNOSIS — G43909 Migraine, unspecified, not intractable, without status migrainosus: Secondary | ICD-10-CM | POA: Diagnosis not present

## 2016-05-25 MED ORDER — PROCHLORPERAZINE EDISYLATE 5 MG/ML IJ SOLN
5.0000 mg | Freq: Once | INTRAMUSCULAR | Status: AC
  Administered 2016-05-25: 5 mg via INTRAVENOUS
  Filled 2016-05-25: qty 2

## 2016-05-25 MED ORDER — KETOROLAC TROMETHAMINE 30 MG/ML IJ SOLN
30.0000 mg | Freq: Once | INTRAMUSCULAR | Status: AC
  Administered 2016-05-25: 30 mg via INTRAVENOUS
  Filled 2016-05-25: qty 1

## 2016-05-25 MED ORDER — DIPHENHYDRAMINE HCL 50 MG/ML IJ SOLN
25.0000 mg | Freq: Once | INTRAMUSCULAR | Status: AC
  Administered 2016-05-25: 25 mg via INTRAVENOUS
  Filled 2016-05-25: qty 1

## 2016-05-25 MED ORDER — PROMETHAZINE HCL 25 MG RE SUPP: 25.0000 mg | Freq: Four times a day (QID) | RECTAL | 0 refills | Status: DC | PRN

## 2016-05-25 MED ORDER — BUTALBITAL-APAP-CAFF-COD 50-325-40-30 MG PO CAPS: ORAL_CAPSULE | ORAL | 0 refills | Status: DC

## 2016-05-25 NOTE — ED Provider Notes (Addendum)
[place] DEPT Provider Note   CSN: [id number] Arrival date & time: 05/25/16  0757     History   Chief Complaint Chief Complaint  Patient presents with  . Headache    [name] is a 46 y.o. female.   Headache   Chronicity: acute on chronic. The current episode started yesterday. The problem has been gradually worsening. The headache is associated with emotional stress, bright light and loud noise. The pain is located in the frontal and temporal region. The quality of the pain is described as throbbing. The pain is moderate. Radiates to: back of head. Associated symptoms include nausea. Pertinent negatives include no fever, no near-syncope, no palpitations, no syncope and no vomiting. Treatments tried: topamax. The treatment provided no relief.    Past Medical History:  Diagnosis Date  . Headache(784.0)    migraines  . Hypertension   . Hypothyroidism     Patient Active Problem List   Diagnosis Date Noted  . Chronic migraine w/o aura w/o status migrainosus, not intractable 08/22/2015  . Abdominal pain, epigastric 07/03/2013  . Acute kidney injury ([place]) 10/20/2011  . Nausea & vomiting 10/19/2011  . Suprapubic abdominal pain 10/19/2011  . Dehydration 10/19/2011  . Hypothyroidism 10/19/2011  . Migraines 10/19/2011    Past Surgical History:  Procedure Laterality Date  . TUBAL LIGATION    . uterine ablation    . uterine polyps      OB History    Gravida Para Term Preterm AB Living             0   SAB TAB Ectopic Multiple Live Births                   Home Medications    Prior to Admission medications   Medication Sig Start Date End Date Taking? Authorizing Provider  ALPRAZolam Duanne Moron) 1 MG tablet Take 1 mg by mouth at bedtime. 05/01/16  Yes [provider]  amitriptyline (ELAVIL) 50 MG tablet Take 1 tablet (50 mg total) by mouth at bedtime. 12/20/15  Yes Melvenia Beam, MD  FEVERFEW PO Take 1 Dose by mouth daily.   Yes [provider]  ibuprofen (ADVIL,MOTRIN) 200 MG tablet Take 800 mg by mouth every 6 (six) hours as needed for headache or mild pain.   Yes [provider]  levothyroxine (SYNTHROID, LEVOTHROID) 150 MCG tablet Take 150 mcg by mouth daily before breakfast.   Yes [provider]  Multiple Vitamin (MULTIVITAMIN WITH MINERALS) TABS tablet Take 1 tablet by mouth at bedtime.   Yes [provider]  ondansetron (ZOFRAN ODT) 4 MG disintegrating tablet Take 1-2 tablets (4 mg total) by mouth every 8 (eight) hours as needed 08/18/15  Yes Melvenia Beam, MD  propranolol ER (INDERAL LA) 60 MG 24 hr capsule Take 1 capsule (60 mg total) by mouth daily. 04/06/16  Yes Melvenia Beam, MD  SUMAtriptan (IMITREX) 100 MG tablet Take 100 mg by mouth every 2 (two) hours as needed for migraine.    Yes [provider]  SUMAtriptan 6 MG/0.5ML SOAJ INJECT 6MLS INTO THE SKIN AS NEEDED MAY REPEAT IN 2 HOURS MAX 2/24 03/15/16  Yes Melvenia Beam, MD  tiZANidine (ZANAFLEX) 4 MG tablet Take 1 tablet (4 mg total) by mouth every 6 (six) hours as needed for muscle spasms (spasms). 04/14/16  Yes Melvenia Beam, MD  Topiramate ER (QUDEXY XR) 150 MG CS24 Take 300 mg by mouth at bedtime. 04/06/16  Yes Melvenia Beam, MD  vitamin C (ASCORBIC ACID) 500 MG tablet Take 500 mg by mouth at bedtime.   Yes [provider]  traMADol (ULTRAM) 50 MG tablet Take 1 tablet (50 mg total) by mouth every 6 (six) hours as needed. Patient not taking: Reported on 05/25/2016 04/06/16   Melvenia Beam, MD    Family History Family History  Problem Relation Age of Onset  . Migraines Mother   . Migraines Other     Social History Social History  Substance Use Topics  . Smoking status: Never Smoker  . Smokeless tobacco: Never Used  . Alcohol use Yes     Comment: social     Allergies   Patient has no known allergies.   Review of Systems Review of Systems  Constitutional: Negative for fever.    Cardiovascular: Negative for palpitations, syncope and near-syncope.  Gastrointestinal: Positive for nausea. Negative for vomiting.  Neurological: Positive for headaches.  All other systems reviewed and are negative.    Physical Exam Updated Vital Signs BP 116/80   Pulse 79   Temp 97.4 F (36.3 C)   Resp 18   Ht 5\' 7"  (1.702 m)   Wt 61.2 kg   SpO2 99%   BMI 21.14 kg/m   Physical Exam  Constitutional: She appears well-developed and well-nourished. No distress.  HENT:  Head: Normocephalic and atraumatic.  Right Ear: External ear normal.  Left Ear: External ear normal.  Eyes: Conjunctivae are normal. Right eye exhibits no discharge. Left eye exhibits no discharge. No scleral icterus.  Neck: Neck supple. No tracheal deviation present.  Cardiovascular: Normal rate, regular rhythm and intact distal pulses.   Pulmonary/Chest: Effort normal and breath sounds normal. No stridor. No respiratory distress. She has no wheezes. She has no rales.  Abdominal: Soft. Bowel sounds are normal. She exhibits no distension. There is no tenderness. There is no rebound and no guarding.  Musculoskeletal: She exhibits no edema or tenderness.  Neurological: She is alert. She has normal strength. No cranial nerve deficit (no facial droop, extraocular movements intact, no slurred speech) or sensory deficit. She exhibits normal muscle tone. She displays no seizure activity. Coordination normal.  Skin: Skin is warm and dry. No rash noted.  Psychiatric: She has a normal mood and affect.  Nursing note and vitals reviewed.    ED Treatments / Results  Labs (all labs ordered are listed, but only abnormal results are displayed) Labs Reviewed - No data to display  EKG  EKG Interpretation None       Radiology No results found.  Procedures Procedures (including critical care time)  Medications Ordered in ED Medications  ketorolac (TORADOL) 30 MG/ML injection 30 mg (30 mg Intravenous Given 05/25/16  0908)  diphenhydrAMINE (BENADRYL) injection 25 mg (25 mg Intravenous Given 05/25/16 0910)  prochlorperazine (COMPAZINE) injection 5 mg (5 mg Intravenous Given 05/25/16 0909)     Initial Impression / Assessment and Plan / ED Course  I have reviewed the triage vital signs and the nursing notes.  Pertinent labs & imaging results that were available during my care of the patient were reviewed by me and considered in my medical decision making (see chart for details).      Final Clinical Impressions(s) / ED Diagnoses MDm Vital signs within normal limits. Initial neurologic examination is negative for acute problem.  Patient treated with migraine cocktail.  Recheck. No new gross neurologic deficits. Patient states she feels much better. She feels that she can handle  the headache at home now. Patient will be treated with promethazine for nausea, Fioricet-codeine for pain #12 tablets. Patient will follow-up with her primary physician Dr. Luan Pulling to discuss breakthrough headache pain.    Final diagnoses:  Migraine without aura and without status migrainosus, not intractable    New Prescriptions New Prescriptions   No medications on file     Lily Kocher, Hershal Coria 05/25/16 [place], [place], DO 05/30/16 0810    Lily Kocher, PA-C 06/08/16 2015    Francine Graven, DO 06/09/16 2324

## 2016-05-25 NOTE — Discharge Instructions (Signed)
Your vital signs are within normal range. Your examination does not reveal an acute neurologic deficit at this time. Please rest is much as possible. Please use promethazine suppository for nausea/vomiting. Use Fioricet-codeine for pain if needed. These medications may cause drowsiness, please use them with caution.

## 2016-05-25 NOTE — ED Triage Notes (Signed)
Pt reports  Migraine headache since yesterday.  Reports nausea, no vomiting.

## 2016-05-29 ENCOUNTER — Telehealth: Payer: Self-pay | Admitting: Neurology

## 2016-05-29 NOTE — Telephone Encounter (Signed)
Pt would like to know if Aimovig (an injection) can be ordered for her, please call

## 2016-05-30 NOTE — Telephone Encounter (Signed)
Returned pt TC and let her know that although new medication has been released, we are unsure yet how the insurance companies are going to be handling it. When we find out, Dr. Jaynee Eagles will then be able to prescribe it. Says that hopefully we'll know more and can discuss further at her appt scheduled in July. Voiced appreciation for call.

## 2016-06-21 ENCOUNTER — Telehealth: Payer: Self-pay

## 2016-06-21 NOTE — Telephone Encounter (Signed)
Received PA request for tramadol. Completed on CoverMyMeds. Spoke to pharmacist at [facility] who reported that pt last filled tramadol rx on 05/09/16. Has since had rx transferred to [facility].

## 2016-06-28 NOTE — Telephone Encounter (Signed)
PA approved - effective from 06/21/2016 through 07/20/2016.

## 2016-06-30 DIAGNOSIS — G43011 Migraine without aura, intractable, with status migrainosus: Secondary | ICD-10-CM | POA: Diagnosis not present

## 2016-07-05 ENCOUNTER — Telehealth: Payer: Self-pay | Admitting: Neurology

## 2016-07-05 MED ORDER — METHYLPREDNISOLONE 4 MG PO TBPK: ORAL_TABLET | ORAL | 0 refills | Status: DC

## 2016-07-05 NOTE — Telephone Encounter (Signed)
Rx e-scribed to pharmacy.

## 2016-07-05 NOTE — Addendum Note (Signed)
Addended by: Monte Fantasia on: 07/05/2016 04:25 PM   Modules accepted: Orders

## 2016-07-05 NOTE — Telephone Encounter (Signed)
Pt is aware of appointment next week, she is calling because for about every other day she has had a migraine and has even gone to the ED. Pt wants to know if a prednisone pack or something could be called in for her until her appointment, please call. Pt has changed pharmacies.  [facility]

## 2016-07-05 NOTE — Telephone Encounter (Signed)
Please send in a medrol dosepak thanks

## 2016-07-13 ENCOUNTER — Ambulatory Visit: Payer: BLUE CROSS/BLUE SHIELD | Admitting: Neurology

## 2016-07-17 ENCOUNTER — Ambulatory Visit (INDEPENDENT_AMBULATORY_CARE_PROVIDER_SITE_OTHER): Payer: BLUE CROSS/BLUE SHIELD | Admitting: Neurology

## 2016-07-17 VITALS — BP 105/71 | HR 67 | Ht 67.0 in

## 2016-07-17 DIAGNOSIS — G43709 Chronic migraine without aura, not intractable, without status migrainosus: Secondary | ICD-10-CM

## 2016-07-17 NOTE — Progress Notes (Signed)
Botox 100 units/vial x 2 vials from floor stock, still waiting for shipment from specialty pharmacy. [place] 7215-8727-61 Lot [id number] Exp 01 2021  Diluted in 4 ml of Bacteriostatic 0.9% NaCl NDC 6394-3200-37 Lot [id number] Exp [id number]

## 2016-07-18 NOTE — Progress Notes (Signed)

## 2016-07-25 ENCOUNTER — Telehealth: Payer: Self-pay | Admitting: Neurology

## 2016-07-25 MED ORDER — SUMATRIPTAN SUCCINATE 100 MG PO TABS: 100.0000 mg | ORAL_TABLET | ORAL | 5 refills | Status: DC | PRN

## 2016-07-25 MED ORDER — TIZANIDINE HCL 4 MG PO TABS: 4.0000 mg | ORAL_TABLET | Freq: Four times a day (QID) | ORAL | 5 refills | Status: DC | PRN

## 2016-07-25 NOTE — Telephone Encounter (Signed)
Refills e-scribed to pt's new pharmacy as requested.

## 2016-07-25 NOTE — Addendum Note (Signed)
Addended by: Monte Fantasia on: 07/25/2016 05:05 PM   Modules accepted: Orders

## 2016-07-25 NOTE — Telephone Encounter (Signed)
Pt calling to inform that her [place], [place] (Phone) [phone] (Fax)   Has been trying to fax over the request for a refill of  SUMAtriptan (IMITREX) 100 MG tablet  tiZANidine (ZANAFLEX) 4 MG tablet   as of 7-06 with no response, pt is out of them both, please call

## 2016-07-31 ENCOUNTER — Other Ambulatory Visit: Payer: Self-pay | Admitting: Neurology

## 2016-07-31 ENCOUNTER — Telehealth: Payer: Self-pay

## 2016-07-31 NOTE — Telephone Encounter (Signed)
Aimovig service request form and rx completed, signed and faxed to [employer].

## 2016-08-04 ENCOUNTER — Encounter (HOSPITAL_COMMUNITY): Payer: Self-pay | Admitting: *Deleted

## 2016-08-04 ENCOUNTER — Emergency Department (HOSPITAL_COMMUNITY)
Admission: EM | Admit: 2016-08-04 | Discharge: 2016-08-04 | Disposition: A | Discharge status: Home / Self Care | Payer: BLUE CROSS/BLUE SHIELD | Attending: Emergency Medicine | Admitting: Emergency Medicine

## 2016-08-04 DIAGNOSIS — Z79899 Other long term (current) drug therapy: Secondary | ICD-10-CM | POA: Diagnosis not present

## 2016-08-04 DIAGNOSIS — M6281 Muscle weakness (generalized): Secondary | ICD-10-CM | POA: Diagnosis not present

## 2016-08-04 DIAGNOSIS — R42 Dizziness and giddiness: Secondary | ICD-10-CM | POA: Diagnosis not present

## 2016-08-04 DIAGNOSIS — E039 Hypothyroidism, unspecified: Secondary | ICD-10-CM | POA: Diagnosis not present

## 2016-08-04 DIAGNOSIS — E860 Dehydration: Secondary | ICD-10-CM | POA: Diagnosis not present

## 2016-08-04 LAB — TSH: TSH: 3.572 u[IU]/mL (ref 0.350–4.500)

## 2016-08-04 LAB — URINALYSIS, ROUTINE W REFLEX MICROSCOPIC
Bilirubin Urine: NEGATIVE
GLUCOSE, UA: NEGATIVE mg/dL
Hgb urine dipstick: NEGATIVE
Ketones, ur: NEGATIVE mg/dL
LEUKOCYTES UA: NEGATIVE
Nitrite: NEGATIVE
PROTEIN: NEGATIVE mg/dL
Specific Gravity, Urine: 1.013 (ref 1.005–1.030)
pH: 6 (ref 5.0–8.0)

## 2016-08-04 LAB — CBC
HCT: 41.4 % (ref 36.0–46.0)
Hemoglobin: 14.1 g/dL (ref 12.0–15.0)
MCH: 32.6 pg (ref 26.0–34.0)
MCHC: 34.1 g/dL (ref 30.0–36.0)
MCV: 95.8 fL (ref 78.0–100.0)
PLATELETS: 217 10*3/uL (ref 150–400)
RBC: 4.32 MIL/uL (ref 3.87–5.11)
RDW: 11.9 % (ref 11.5–15.5)
WBC: 7.3 10*3/uL (ref 4.0–10.5)

## 2016-08-04 LAB — COMPREHENSIVE METABOLIC PANEL
ALBUMIN: 3.9 g/dL (ref 3.5–5.0)
ALT: 36 U/L (ref 14–54)
AST: 23 U/L (ref 15–41)
Alkaline Phosphatase: 54 U/L (ref 38–126)
Anion gap: 5 (ref 5–15)
BUN: 13 mg/dL (ref 6–20)
CALCIUM: 9.5 mg/dL (ref 8.9–10.3)
CO2: 26 mmol/L (ref 22–32)
Chloride: 108 mmol/L (ref 101–111)
Creatinine, Ser: 1.08 mg/dL — ABNORMAL HIGH (ref 0.44–1.00)
GFR calc Af Amer: >60 mL/min (ref >60)
GFR calc non Af Amer: >60 mL/min (ref >60)
GLUCOSE: 125 mg/dL — AB (ref 65–99)
POTASSIUM: 3.5 mmol/L (ref 3.5–5.1)
SODIUM: 139 mmol/L (ref 135–145)
TOTAL PROTEIN: 6.7 g/dL (ref 6.5–8.1)
Total Bilirubin: 0.7 mg/dL (ref 0.3–1.2)

## 2016-08-04 LAB — TROPONIN I: Troponin I: <0.03 ng/mL (ref <0.03)

## 2016-08-04 LAB — CBG MONITORING, ED: GLUCOSE-CAPILLARY: 119 mg/dL — AB (ref 65–99)

## 2016-08-04 MED ORDER — SODIUM CHLORIDE 0.9 % IV BOLUS (SEPSIS)
2000.0000 mL | Freq: Once | INTRAVENOUS | Status: AC
  Administered 2016-08-04: 2000 mL via INTRAVENOUS

## 2016-08-04 NOTE — ED Notes (Signed)
ED Provider at bedside. 

## 2016-08-04 NOTE — Discharge Instructions (Signed)
Make sure that you drink at least six 8 ounce glasses of water or Gatorade each day in order to stay well-hydrated. Follow-up with Dr. Luan Pulling or return if concern for any reason

## 2016-08-04 NOTE — ED Triage Notes (Signed)
Pt c/o generalized weakness, decreased appetite, muscle cramps in bilateral thighs x 1 week. Pt was seen by Dr. Luan Pulling today and told to come to come to ED for evaluation due to low BP. BP 88/48 in triage, HR 62.

## 2016-08-04 NOTE — ED Provider Notes (Signed)
[facility] DEPT Provider Note   CSN: [id number] Arrival date & time: 08/04/16  1236     History   Chief Complaint Chief Complaint  Patient presents with  . Weakness    low BP    [name] is a 46 y.o. female.  HPI  Past Medical History:  Diagnosis Date  . Headache(784.0)    migraines  . Hypothyroidism   Complains of generalized weakness and lightheadedness. Symptoms worse with standing and improved with lying down symptoms onset 3 days ago. No nausea or vomiting. She does admit to diminished appetite she denies abdominal pain denies chest pain denies shortness of breath denies fever. No treatment prior to coming here. Seen by Dr. Luan Pulling in his office prior to coming here, sent here for further evaluation. She stopped her propranolol which she takes as prophylactic medicine for migraine headaches 3 days ago because she thought it might be contributing to her weakness and lightheadedness  Patient Active Problem List   Diagnosis Date Noted  . Chronic migraine w/o aura w/o status migrainosus, not intractable 08/22/2015  . Abdominal pain, epigastric 07/03/2013  . Acute kidney injury ([place]) 10/20/2011  . Nausea & vomiting 10/19/2011  . Suprapubic abdominal pain 10/19/2011  . Dehydration 10/19/2011  . Hypothyroidism 10/19/2011  . Migraines 10/19/2011    Past Surgical History:  Procedure Laterality Date  . TUBAL LIGATION    . uterine ablation    . uterine polyps      OB History    Gravida Para Term Preterm AB Living             0   SAB TAB Ectopic Multiple Live Births                   Home Medications    Prior to Admission medications   Medication Sig Start Date End Date Taking? Authorizing Provider  ALPRAZolam Duanne Moron) 1 MG tablet Take 1 mg by mouth at bedtime. 05/01/16   [provider]  amitriptyline (ELAVIL) 50 MG tablet TAKE (1) TABLET BY MOUTH AT BEDTIME. 07/31/16   Melvenia Beam, MD  FEVERFEW PO Take 1 Dose by mouth daily.     [provider]  ibuprofen (ADVIL,MOTRIN) 200 MG tablet Take 800 mg by mouth every 6 (six) hours as needed for headache or mild pain.    [provider]  levothyroxine (SYNTHROID, LEVOTHROID) 150 MCG tablet Take 150 mcg by mouth daily before breakfast.    [provider]  methylPREDNISolone (MEDROL DOSEPAK) 4 MG TBPK tablet Take 24 mg PO w/ food on day 1, then decrease by 4 mg/day x5 days per dose pack instructions. 07/05/16   Melvenia Beam, MD  Multiple Vitamin (MULTIVITAMIN WITH MINERALS) TABS tablet Take 1 tablet by mouth at bedtime.    [provider]  ondansetron (ZOFRAN ODT) 4 MG disintegrating tablet Take 1-2 tablets (4 mg total) by mouth every 8 (eight) hours as needed 08/18/15   Melvenia Beam, MD  promethazine (PHENERGAN) 25 MG suppository Place 1 suppository (25 mg total) rectally every 6 (six) hours as needed for nausea or vomiting. 05/25/16   Lily Kocher, PA-C  propranolol ER (INDERAL LA) 60 MG 24 hr capsule Take 1 capsule (60 mg total) by mouth daily. 04/06/16   Melvenia Beam, MD  SUMAtriptan (IMITREX) 100 MG tablet Take 1 tablet (100 mg total) by mouth every 2 (two) hours as needed for migraine. Max: 2 doses per day 07/25/16   Sarina Ill  B, MD  tiZANidine (ZANAFLEX) 4 MG tablet Take 1 tablet (4 mg total) by mouth every 6 (six) hours as needed for muscle spasms (spasms). 07/25/16   Melvenia Beam, MD  Topiramate ER (QUDEXY XR) 150 MG CS24 Take 300 mg by mouth at bedtime. 04/06/16   Melvenia Beam, MD  traMADol (ULTRAM) 50 MG tablet Take 1 tablet (50 mg total) by mouth every 6 (six) hours as needed. 04/06/16   Melvenia Beam, MD  vitamin C (ASCORBIC ACID) 500 MG tablet Take 500 mg by mouth at bedtime.    [provider]    Family History Family History  Problem Relation Age of Onset  . Migraines Mother   . Migraines Other     Social History Social History  Substance Use Topics  . Smoking status: Never Smoker  . Smokeless  tobacco: Never Used  . Alcohol use Yes     Comment: social  No illicit drug use   Allergies   Patient has no known allergies.   Review of Systems Review of Systems  HENT: Negative.   Respiratory: Negative.   Cardiovascular: Negative.   Gastrointestinal: Negative.   Musculoskeletal: Negative.   Skin: Negative.   Neurological: Positive for weakness and light-headedness.       Generalized weakness  Psychiatric/Behavioral: Negative.   All other systems reviewed and are negative.    Physical Exam Updated Vital Signs BP (!) 88/48   Pulse 63   Temp 97.6 F (36.4 C) (Axillary)   Resp 16   Ht 5\' 7"  (1.702 m)   Wt 60.3 kg (133 lb)   SpO2 100%   BMI 20.83 kg/m   Physical Exam  Constitutional: She appears well-developed and well-nourished.  HENT:  Head: Normocephalic and atraumatic.  Mucous membranes dry  Eyes: Pupils are equal, round, and reactive to light. Conjunctivae are normal.  Neck: Neck supple. No tracheal deviation present. No thyromegaly present.  Cardiovascular: Regular rhythm.   No murmur heard. Bradycardic  Pulmonary/Chest: Effort normal and breath sounds normal.  Abdominal: Soft. Bowel sounds are normal. She exhibits no distension. There is no tenderness.  Musculoskeletal: Normal range of motion. She exhibits no edema or tenderness.  Neurological: She is alert. Coordination normal.  Skin: Skin is warm and dry. No rash noted.  Psychiatric: She has a normal mood and affect.  Nursing note and vitals reviewed.    ED Treatments / Results  Labs (all labs ordered are listed, but only abnormal results are displayed) Labs Reviewed  CBC  URINALYSIS, ROUTINE W REFLEX MICROSCOPIC  TSH  COMPREHENSIVE METABOLIC PANEL  TROPONIN I  CBG MONITORING, ED    EKG  EKG Interpretation  Date/Time:  Friday August 04 2016 13:11:16 EDT Ventricular Rate:  54 PR Interval:    QRS Duration: 118 QT Interval:  422 QTC Calculation: 400 R Axis:   36 Text Interpretation:   Sinus rhythm Nonspecific intraventricular conduction delay Low voltage, precordial leads Since last tracing rate slower Confirmed by Orlie Dakin [id number] on 08/04/2016 1:23:53 PM       Radiology No results found.  Procedures Procedures (including critical care time)  Medications Ordered in ED Medications  sodium chloride 0.9 % bolus 2,000 mL (not administered)    Results for orders placed or performed during the hospital encounter of 08/04/16  CBC  Result Value Ref Range   WBC 7.3 4.0 - 10.5 K/uL   RBC 4.32 3.87 - 5.11 MIL/uL   Hemoglobin 14.1 12.0 - 15.0 g/dL  HCT 41.4 36.0 - 46.0 %   MCV 95.8 78.0 - 100.0 fL   MCH 32.6 26.0 - 34.0 pg   MCHC 34.1 30.0 - 36.0 g/dL   RDW 11.9 11.5 - 15.5 %   Platelets 217 150 - 400 K/uL  Urinalysis, Routine w reflex microscopic  Result Value Ref Range   Color, Urine AMBER (A) YELLOW   APPearance CLOUDY (A) CLEAR   Specific Gravity, Urine 1.013 1.005 - 1.030   pH 6.0 5.0 - 8.0   Glucose, UA NEGATIVE NEGATIVE mg/dL   Hgb urine dipstick NEGATIVE NEGATIVE   Bilirubin Urine NEGATIVE NEGATIVE   Ketones, ur NEGATIVE NEGATIVE mg/dL   Protein, ur NEGATIVE NEGATIVE mg/dL   Nitrite NEGATIVE NEGATIVE   Leukocytes, UA NEGATIVE NEGATIVE  TSH  Result Value Ref Range   TSH 3.572 0.350 - 4.500 uIU/mL  Comprehensive metabolic panel  Result Value Ref Range   Sodium 139 135 - 145 mmol/L   Potassium 3.5 3.5 - 5.1 mmol/L   Chloride 108 101 - 111 mmol/L   CO2 26 22 - 32 mmol/L   Glucose, Bld 125 (H) 65 - 99 mg/dL   BUN 13 6 - 20 mg/dL   Creatinine, Ser 1.08 (H) 0.44 - 1.00 mg/dL   Calcium 9.5 8.9 - 10.3 mg/dL   Total Protein 6.7 6.5 - 8.1 g/dL   Albumin 3.9 3.5 - 5.0 g/dL   AST 23 15 - 41 U/L   ALT 36 14 - 54 U/L   Alkaline Phosphatase 54 38 - 126 U/L   Total Bilirubin 0.7 0.3 - 1.2 mg/dL   GFR calc non Af Amer >60 >60 mL/min   GFR calc Af Amer >60 >60 mL/min   Anion gap 5 5 - 15  Troponin I  Result Value Ref Range   Troponin I <0.03 <0.03  ng/mL  CBG monitoring, ED  Result Value Ref Range   Glucose-Capillary 119 (H) 65 - 99 mg/dL   No results found. Initial Impression / Assessment and Plan / ED Course  I have reviewed the triage vital signs and the nursing notes.  Pertinent labs & imaging results that were available during my care of the patient were reviewed by me and considered in my medical decision making (see chart for details).     3 PM Feels much improved improved after treatment with 2 L of normal saline infused. She is asymptomatic and no longer lightheaded on standing Plan encourage hydration. Follow-up with Dr. Luan Pulling or return as needed Final Clinical Impressions(s) / ED Diagnoses  Diagnosis dehydration Final diagnoses:  None    New Prescriptions New Prescriptions   No medications on file     Orlie Dakin, MD 08/04/16 1517

## 2016-08-17 NOTE — Telephone Encounter (Signed)
Pt's paperwork was faxed in on 07/31/16. It had been taking 3-4 wks for the specialty pharmacy to contact patients for shipment. However, more recently, it has been up to 6 wks due to the number of requests for the medication.

## 2016-08-17 NOTE — Telephone Encounter (Signed)
Pt calling re: the Aimovig, she said she was told it could take a few weeks.  Pt said she went on the Facebook page and it said 3 weeks.  Pt said for her it has been 4 weeks, she is asking for a call back.

## 2016-08-22 DIAGNOSIS — G43909 Migraine, unspecified, not intractable, without status migrainosus: Secondary | ICD-10-CM | POA: Diagnosis not present

## 2016-08-22 DIAGNOSIS — Z79899 Other long term (current) drug therapy: Secondary | ICD-10-CM | POA: Diagnosis not present

## 2016-08-22 DIAGNOSIS — R51 Headache: Secondary | ICD-10-CM | POA: Diagnosis not present

## 2016-08-31 DIAGNOSIS — R5383 Other fatigue: Secondary | ICD-10-CM | POA: Diagnosis not present

## 2016-08-31 DIAGNOSIS — I959 Hypotension, unspecified: Secondary | ICD-10-CM | POA: Diagnosis not present

## 2016-08-31 DIAGNOSIS — M2550 Pain in unspecified joint: Secondary | ICD-10-CM | POA: Diagnosis not present

## 2016-08-31 DIAGNOSIS — E039 Hypothyroidism, unspecified: Secondary | ICD-10-CM | POA: Diagnosis not present

## 2016-09-13 ENCOUNTER — Telehealth: Payer: Self-pay | Admitting: Neurology

## 2016-09-13 NOTE — Telephone Encounter (Signed)
Returned pt's call. Reports that since taking Aimovig injection last wk, she has had daily HAs w/ nausea and light/sound sensitivity. Says that these HAs are different than her usual frontal migraines as she's also having HA to the back of her head. Asks about rx for Fioricet which she says that she had asked about at her last OV. Also says that she's not been able to tolerate propranolol d/t low HR and BP. Inquires about taking Verapamil instead. Also wants to know if she should go ahead and use 2nd 70 mg Aimovig pen. Educated pt that Aimovig is a preventive and she may not notice improvement in HAs right away. She voiced understanding saying that rescue med such as Imitrex is not relieving pain either.

## 2016-09-13 NOTE — Telephone Encounter (Signed)
Patient took Aimovig injection on 09-08-16 and has had a migraine since then. Should she take second dosage? Please call and discuss.

## 2016-09-14 ENCOUNTER — Other Ambulatory Visit: Payer: Self-pay | Admitting: *Deleted

## 2016-09-14 ENCOUNTER — Other Ambulatory Visit: Payer: Self-pay | Admitting: Neurology

## 2016-09-14 ENCOUNTER — Telehealth: Payer: Self-pay | Admitting: Neurology

## 2016-09-14 MED ORDER — CANDESARTAN CILEXETIL 4 MG PO TABS: ORAL_TABLET | ORAL | 3 refills | Status: DC

## 2016-09-14 MED ORDER — CANDESARTAN CILEXETIL 8 MG PO TABS: ORAL_TABLET | ORAL | 3 refills | Status: DC

## 2016-09-14 MED ORDER — BUTALBITAL-APAP-CAFFEINE 50-325-40 MG PO TABS: 1.0000 | ORAL_TABLET | Freq: Four times a day (QID) | ORAL | 3 refills | Status: DC | PRN

## 2016-09-14 NOTE — Addendum Note (Signed)
Addended by: Brandon Melnick on: 09/14/2016 03:13 PM   Modules accepted: Orders

## 2016-09-14 NOTE — Telephone Encounter (Signed)
Botox apt for 10/11 needs to be r/s. Dr. Jaynee Eagles will be off.

## 2016-09-14 NOTE — Telephone Encounter (Signed)
Printed copy was not signed.    Redid and sent electronically to [facility].  (did not go thru printed, will have to wait for Dr. Jaynee Eagles to sign.

## 2016-09-14 NOTE — Telephone Encounter (Signed)
I spoke to pt and relayed the message below.  She stated she has had headache since taking the injection.  Relayed that headache may be coincidental.  Recommended repeat in one month and see if happens again.  I relayed that the fiorcet was faxed to [employer].  The other Bp med, candesartan, which is helpful for migraines, was not signed.  Dr. Jaynee Eagles as left the building

## 2016-09-14 NOTE — Telephone Encounter (Signed)
Please let patient know Aimovig is One injection a once monthly shot. The appearance of the headache may be coincidental, I would try the aimovig again one month from the last injection and see if she gets another headache.  I am going to Call in a supply of Fioricet however please impress upon paient patient that she cannot take acute medications more than a total of 10 times a month (this includes ibuprofen, Fioricet, Imitrex or any of the triptan's, Excedrin, goody powders, Tylenol etc.). So for example if she takes all her Imitrex one month and Fioricet then she has taken to many and she is at risk for worsening her migraine disorder due to medication overuse or rebound headache which can cause chronic daily headache. She is advised her not to take any of these medications more than a total of 10 times a month altogether.  I Do not think verapamil is a great migraine preventative however the blood pressure medication called candesartan is starting to have some great evidence for migraine prevention and I'd like to start her on that. Start with one pill daily and then if no side effects in one week 2 pills daily and we can increase from there. This is a blood pressure medications to tell her to watch her pulse, her blood pressure, stop for any significant side effects such as lightheadedness or dizziness or chest pain.

## 2016-09-14 NOTE — Telephone Encounter (Signed)
I called and spoke to April, pharmacist to give verbal order for the candesartan (she relayed that using 8 mg tablets due to insurance,  and directions 1/2 tablet daily for once week, then 1 tablet daily if no side effects.   Pt to call and let us know how she does.   Will monitor for side effects.

## 2016-09-15 ENCOUNTER — Encounter (HOSPITAL_COMMUNITY): Payer: Self-pay | Admitting: Emergency Medicine

## 2016-09-15 ENCOUNTER — Emergency Department (HOSPITAL_COMMUNITY)
Admission: EM | Admit: 2016-09-15 | Discharge: 2016-09-15 | Disposition: A | Discharge status: Home / Self Care | Payer: BLUE CROSS/BLUE SHIELD | Attending: Emergency Medicine | Admitting: Emergency Medicine

## 2016-09-15 DIAGNOSIS — Z79899 Other long term (current) drug therapy: Secondary | ICD-10-CM | POA: Diagnosis not present

## 2016-09-15 DIAGNOSIS — G43809 Other migraine, not intractable, without status migrainosus: Secondary | ICD-10-CM

## 2016-09-15 DIAGNOSIS — E039 Hypothyroidism, unspecified: Secondary | ICD-10-CM | POA: Diagnosis not present

## 2016-09-15 DIAGNOSIS — R51 Headache: Secondary | ICD-10-CM | POA: Diagnosis not present

## 2016-09-15 MED ORDER — DIPHENHYDRAMINE HCL 50 MG/ML IJ SOLN
25.0000 mg | Freq: Once | INTRAMUSCULAR | Status: AC
  Administered 2016-09-15: 25 mg via INTRAVENOUS
  Filled 2016-09-15: qty 1

## 2016-09-15 MED ORDER — SODIUM CHLORIDE 0.9 % IV BOLUS (SEPSIS)
1000.0000 mL | Freq: Once | INTRAVENOUS | Status: AC
  Administered 2016-09-15: 1000 mL via INTRAVENOUS

## 2016-09-15 MED ORDER — DEXAMETHASONE SODIUM PHOSPHATE 10 MG/ML IJ SOLN
10.0000 mg | Freq: Once | INTRAMUSCULAR | Status: AC
  Administered 2016-09-15: 10 mg via INTRAVENOUS
  Filled 2016-09-15: qty 1

## 2016-09-15 MED ORDER — PROCHLORPERAZINE MALEATE 10 MG PO TABS: 10.0000 mg | ORAL_TABLET | Freq: Two times a day (BID) | ORAL | 0 refills | Status: DC | PRN

## 2016-09-15 MED ORDER — PROCHLORPERAZINE EDISYLATE 5 MG/ML IJ SOLN
10.0000 mg | Freq: Once | INTRAMUSCULAR | Status: AC
Start: 2016-09-15 — End: 2016-09-15
  Administered 2016-09-15: 10 mg via INTRAVENOUS
  Filled 2016-09-15: qty 2

## 2016-09-15 MED ORDER — KETOROLAC TROMETHAMINE 30 MG/ML IJ SOLN
30.0000 mg | Freq: Once | INTRAMUSCULAR | Status: AC
  Administered 2016-09-15: 30 mg via INTRAVENOUS
  Filled 2016-09-15: qty 1

## 2016-09-15 NOTE — ED Triage Notes (Signed)
Patient complains of "migraine" headache. Patient states she had a new injection Aimovig for migraines on Friday. Patient states she has had a headache since she took the injection.

## 2016-09-15 NOTE — ED Provider Notes (Signed)
[facility] DEPT Provider Note   CSN: [id number] Arrival date & time: 09/15/16  1327     History   Chief Complaint Chief Complaint  Patient presents with  . Headache    [name] is a 46 y.o. female.  HPI  The patient is a 46 year old female, she unfortunately has been encouraged with very bad migraines for which she has been seen in the emergency department multiple times, she is followed very closely by neurology who started a new medication for her this week. She reports having 6 days of a migraine headache, after taking this new medication her headache changed location to be more posterior, more burning in sensation and though it does fluctuate in intensity and rotates between an 8 and a 10 out of 10. She has nausea, sensitivity to light, feels dehydrated because of decreased oral intake. The patient denies any fevers, stiff neck, chest pain coughing or shortness of breath or any other symptoms. She has had chronic headaches since her teenage years  Past Medical History:  Diagnosis Date  . Headache(784.0)    migraines  . Hypothyroidism     Patient Active Problem List   Diagnosis Date Noted  . Chronic migraine w/o aura w/o status migrainosus, not intractable 08/22/2015  . Abdominal pain, epigastric 07/03/2013  . Acute kidney injury ([place]) 10/20/2011  . Nausea & vomiting 10/19/2011  . Suprapubic abdominal pain 10/19/2011  . Dehydration 10/19/2011  . Hypothyroidism 10/19/2011  . Migraines 10/19/2011    Past Surgical History:  Procedure Laterality Date  . TUBAL LIGATION    . uterine ablation    . uterine polyps      OB History    Gravida Para Term Preterm AB Living             0   SAB TAB Ectopic Multiple Live Births                   Home Medications    Prior to Admission medications   Medication Sig Start Date End Date Taking? Authorizing Provider  ALPRAZolam Duanne Moron) 1 MG tablet Take 1 mg by mouth at bedtime. 05/01/16  Yes [provider]  amitriptyline (ELAVIL) 50 MG tablet TAKE (1) TABLET BY MOUTH AT BEDTIME. 07/31/16  Yes Melvenia Beam, MD  butalbital-acetaminophen-caffeine (FIORICET, ESGIC) 50-325-40 MG tablet Take 1 tablet by mouth every 6 (six) hours as needed for headache. 09/14/16  Yes Melvenia Beam, MD  candesartan (ATACAND) 8 MG tablet Take 1/2 tablet (4mg ) for one week, if no side effects increase to one tablet (8mg ). 09/14/16  Yes Melvenia Beam, MD  FEVERFEW PO Take 1 Dose by mouth daily.   Yes [provider]  ibuprofen (ADVIL,MOTRIN) 200 MG tablet Take 800 mg by mouth every 6 (six) hours as needed for headache or mild pain.   Yes [provider]  levothyroxine (SYNTHROID, LEVOTHROID) 150 MCG tablet Take 150 mcg by mouth daily before breakfast.   Yes [provider]  Multiple Vitamin (MULTIVITAMIN WITH MINERALS) TABS tablet Take 1 tablet by mouth at bedtime.   Yes [provider]  ondansetron (ZOFRAN ODT) 4 MG disintegrating tablet Take 1-2 tablets (4 mg total) by mouth every 8 (eight) hours as needed 08/18/15  Yes Melvenia Beam, MD  SUMAtriptan (IMITREX) 100 MG tablet Take 1 tablet (100 mg total) by mouth every 2 (two) hours as needed for migraine. Max: 2 doses per day 07/25/16  Yes Melvenia Beam, MD  SUMAtriptan 6 MG/0.5ML SOAJ Inject 1 Dose as directed as directed. 09/07/16  Yes [provider]  tiZANidine (ZANAFLEX) 4 MG tablet Take 1 tablet (4 mg total) by mouth every 6 (six) hours as needed for muscle spasms (spasms). 07/25/16  Yes Melvenia Beam, MD  Topiramate ER (QUDEXY XR) 150 MG CS24 Take 300 mg by mouth at bedtime. 04/06/16  Yes Melvenia Beam, MD  traMADol (ULTRAM) 50 MG tablet Take 1 tablet (50 mg total) by mouth every 6 (six) hours as needed. 04/06/16  Yes Melvenia Beam, MD  UNABLE TO FIND Inject 1 Dose as directed as directed. Med Name: Aimovig injection   Yes [provider]  vitamin C (ASCORBIC ACID) 500 MG tablet Take 500 mg by mouth at  bedtime.   Yes [provider]  prochlorperazine (COMPAZINE) 10 MG tablet Take 1 tablet (10 mg total) by mouth 2 (two) times daily as needed for nausea or vomiting (Nausea ). 09/15/16   Noemi Chapel, MD    Family History Family History  Problem Relation Age of Onset  . Migraines Mother   . Migraines Other     Social History Social History  Substance Use Topics  . Smoking status: Never Smoker  . Smokeless tobacco: Never Used  . Alcohol use Yes     Comment: social     Allergies   Patient has no known allergies.   Review of Systems Review of Systems  All other systems reviewed and are negative.    Physical Exam Updated Vital Signs BP 132/73 (BP Location: Right Arm)   Pulse (!) 106   Temp 97.6 F (36.4 C) (Oral)   Resp 18   Ht 5\' 7"  (1.702 m)   Wt 60.3 kg (133 lb)   SpO2 100%   BMI 20.83 kg/m   Physical Exam  Constitutional: She appears well-developed and well-nourished. No distress.  HENT:  Head: Normocephalic and atraumatic.  Mouth/Throat: Oropharynx is clear and moist. No oropharyngeal exudate.  Eyes: Pupils are equal, round, and reactive to light. Conjunctivae and EOM are normal. Right eye exhibits no discharge. Left eye exhibits no discharge. No scleral icterus.  Neck: Normal range of motion. Neck supple. No JVD present. No thyromegaly present.  Cardiovascular: Normal rate, regular rhythm, normal heart sounds and intact distal pulses.  Exam reveals no gallop and no friction rub.   No murmur heard. Pulmonary/Chest: Effort normal and breath sounds normal. No respiratory distress. She has no wheezes. She has no rales.  Abdominal: Soft. Bowel sounds are normal. She exhibits no distension and no mass. There is no tenderness.  Musculoskeletal: Normal range of motion. She exhibits no edema or tenderness.  Lymphadenopathy:    She has no cervical adenopathy.  Neurological: She is alert. Coordination normal.  Speech is clear, coordination is normal, gait is  normal, mentation is normal, memory is normal, strength is normal in all 4 extremities, cranial nerves III through XII appear to be intact.  Skin: Skin is warm and dry. No rash noted. No erythema.  Psychiatric: She has a normal mood and affect. Her behavior is normal.  Nursing note and vitals reviewed.    ED Treatments / Results  Labs (all labs ordered are listed, but only abnormal results are displayed) Labs Reviewed - No data to display   Radiology No results found.  Procedures Procedures (including critical care time)  Medications Ordered in ED Medications  prochlorperazine (COMPAZINE) injection 10 mg (10 mg Intravenous Given 09/15/16 1419)  ketorolac (TORADOL)  30 MG/ML injection 30 mg (30 mg Intravenous Given 09/15/16 1418)  diphenhydrAMINE (BENADRYL) injection 25 mg (25 mg Intravenous Given 09/15/16 1419)  dexamethasone (DECADRON) injection 10 mg (10 mg Intravenous Given 09/15/16 1418)  sodium chloride 0.9 % bolus 1,000 mL (1,000 mLs Intravenous New Bag/Given 09/15/16 1431)     Initial Impression / Assessment and Plan / ED Course  I have reviewed the triage vital signs and the nursing notes.  Pertinent labs & imaging results that were available during my care of the patient were reviewed by me and considered in my medical decision making (see chart for details).     HA - tx with supportive care -  Compazine, toradol, decadron, benadryl, bolus  Improved to 2/10 pain and requesting d/c Pt aware of indications for return and in agreement Stable at d/c.  Vitals:   09/15/16 1349 09/15/16 1350  BP: 132/73   Pulse: (!) 106   Resp: 18   Temp: 97.6 F (36.4 C)   TempSrc: Oral   SpO2: 100%   Weight:  60.3 kg (133 lb)  Height:  5\' 7"  (1.702 m)     Final Clinical Impressions(s) / ED Diagnoses   Final diagnoses:  Other migraine without status migrainosus, not intractable    New Prescriptions New Prescriptions   PROCHLORPERAZINE (COMPAZINE) 10 MG TABLET    Take 1 tablet  (10 mg total) by mouth 2 (two) times daily as needed for nausea or vomiting (Nausea ).     Noemi Chapel, MD 09/15/16 1537

## 2016-09-15 NOTE — Discharge Instructions (Signed)
Compazine twice daily as needed for headache or vomiting ER for worsening symptoms including pain, vomiting, fever or stiff neck  Please obtain all of your results from medical records or have your doctors office obtain the results - share them with your doctor - you should be seen at your doctors office in the next 2 days. Call today to arrange your follow up. Take the medications as prescribed. Please review all of the medicines and only take them if you do not have an allergy to them. Please be aware that if you are taking birth control pills, taking other prescriptions, ESPECIALLY ANTIBIOTICS may make the birth control ineffective - if this is the case, either do not engage in sexual activity or use alternative methods of birth control such as condoms until you have finished the medicine and your family doctor says it is OK to restart them. If you are on a blood thinner such as COUMADIN, be aware that any other medicine that you take may cause the coumadin to either work too much, or not enough - you should have your coumadin level rechecked in next 7 days if this is the case.  ?  It is also a possibility that you have an allergic reaction to any of the medicines that you have been prescribed - Everybody reacts differently to medications and while MOST people have no trouble with most medicines, you may have a reaction such as nausea, vomiting, rash, swelling, shortness of breath. If this is the case, please stop taking the medicine immediately and contact your physician.  ?  You should return to the ER if you develop severe or worsening symptoms.

## 2016-09-18 NOTE — Telephone Encounter (Signed)
Pt went to the ER Friday due to migraine and received the migraine cocktail. Migraine went away Friday but Saturday pt woke up and had the same type of headache but its different from he rnormal migraines. Pt states that the pain is on top and posterior head. Pt requesting an earlier apt so that she can discuss the headache and what next course of treatment can be. We had a opening at 11:30 on sept 11th and patient was agreeable to that time.

## 2016-09-18 NOTE — Telephone Encounter (Signed)
Patient calling back stating   candesartan (ATACAND) 8 MG tablet     just takes the edge off her migraines. Please call and discuss.

## 2016-09-19 ENCOUNTER — Encounter: Payer: Self-pay | Admitting: Neurology

## 2016-09-19 ENCOUNTER — Ambulatory Visit (INDEPENDENT_AMBULATORY_CARE_PROVIDER_SITE_OTHER): Payer: BLUE CROSS/BLUE SHIELD | Admitting: Neurology

## 2016-09-19 VITALS — HR 108 | Ht 67.0 in | Wt 133.8 lb

## 2016-09-19 DIAGNOSIS — G4453 Primary thunderclap headache: Secondary | ICD-10-CM

## 2016-09-19 DIAGNOSIS — R29818 Other symptoms and signs involving the nervous system: Secondary | ICD-10-CM | POA: Diagnosis not present

## 2016-09-19 DIAGNOSIS — R200 Anesthesia of skin: Secondary | ICD-10-CM | POA: Diagnosis not present

## 2016-09-19 DIAGNOSIS — R519 Headache, unspecified: Secondary | ICD-10-CM

## 2016-09-19 DIAGNOSIS — R51 Headache: Secondary | ICD-10-CM | POA: Diagnosis not present

## 2016-09-19 DIAGNOSIS — G8929 Other chronic pain: Secondary | ICD-10-CM

## 2016-09-19 MED ORDER — PROCHLORPERAZINE MALEATE 10 MG PO TABS: 10.0000 mg | ORAL_TABLET | Freq: Three times a day (TID) | ORAL | 6 refills | Status: DC | PRN

## 2016-09-19 MED ORDER — AMITRIPTYLINE HCL 50 MG PO TABS: 100.0000 mg | ORAL_TABLET | Freq: Every day | ORAL | 11 refills | Status: DC

## 2016-09-19 MED ORDER — TIZANIDINE HCL 4 MG PO TABS: 4.0000 mg | ORAL_TABLET | Freq: Four times a day (QID) | ORAL | 5 refills | Status: DC | PRN

## 2016-09-19 MED ORDER — CANDESARTAN CILEXETIL 8 MG PO TABS: 16.0000 mg | ORAL_TABLET | Freq: Every day | ORAL | 3 refills | Status: DC

## 2016-09-19 NOTE — Progress Notes (Signed)
[facility]    Provider:  Dr Jaynee Eagles Referring Provider: Sinda Du, MD Primary Care Physician:  Sinda Du, MD  CC: Migraines  Interval history 09/19/2016:  Patient returns today with her mother. Patient started the new CGRP medications in the same day she developed a bilateral occipital burning headache that has not relented in over a week. Not sure what mechanism would cause this, maybe it is coincidental. The pain is severe, it is keeping her up at night, she is here with her mother who also provides much information saying the headaches and severe, burning however she has no migrainous symptoms, no later sound sensitivity, she does have nausea when the pain is severe, nothing like her usual migraines.   Interval history 11/18/2015: She is on the cymbalta, amitriptyline, mood is better and she is sleeping well. She is still on the topiramate. She has had 12 migraines this month and at least 20 headache days. She has had a bad headche since starting the Aimovig on the 31st. She has a burning sensation on the back of the left side of the head. It is constantly burning, she has a lot of nausea and vomiting, She vomits everything. She had left-sided tingling in the face. Even with the botox, 15 a month. Dr Corinna Capra is her obgyn. Will discuss birth control with ObGyn, try Nuvaring.   XIP:JASNKN M Inscoeis a 46 y.o.femalehere as a referral from Dr. Mertie Moores migraines. Chronic since 1990. Here for second opinion. She has been going to see Dr. Sima Matas in [place] at the [facility]. Worsening in the setting of a divorce. Started at the age of 4. Mother had migraines. She has been seeing Dr. Sima Matas for many years. She has pounding and pressure in the middle of the head, sometimes more on the right. She gets migraines 2 a week at least and they can last several days. She takes an imitrex shot for acute management. Triggers are wine, weather, exhaustion. Her  sleeping is better now. She has been on [place] for 10 years. She takes advil every day. Migraineshave lasted 3 days. She has light sensitivity, sound sensitivity, has to lay still in a dark room, no nausea or vomiting. No aura. They come on very quickly. She is only on synthroid and ambien. She feels a lot better since discontinuing all her medications. She has tried multiple medications. She has been getting botox for migraine for at least 5 years which has tremendously helped she used to have daily migraines. She has at least 15 days a month of headache of which at least 8 are migrainous. No medication overuse. She gets good benefit from botox. Migraines can be severe. No aura.   Reviewed notes, labs and imaging from outside physicians, which showed:   Patient is here for second opinion. She is to Dr. Claudie Fisherman in [place]. As of last note, She has 8 severe headaches in a month, and severity is severe. Location is frontal. The headaches last for 12 hours. Pain is described as pressure-like and tight. Symptoms have been associated with sensitivity to light, sensitivity to noise and worse with movement activities. Denies nausea, vomiting, odor sensitivity or dizziness. Headache triggers include alcohol, fatigue, sleep deprivation, whether, periods of sinus problems. Since Botox patient reduced her headaches from almost daily to recently 16 headache free days and a 20 headache free days last visit with Dr. Sima Matas. She's been getting Botox for several years. She is under stress with recent separation. Her BP was elevated  so she was prescribed lisinopril and hydrochlorothiazide. Imitrex has not been as effective lately and she would be interested in trying Maxalt. Vitamins especially CoQ10 seem to help. She's had piercings and she tried taking neuro device. She's been told to consider hysterectomy to decrease the headaches.  Past medications and past medications include: Excedrin and Ultram, D.H.E.  capsule, DHE injection RV, Relpax and Zomig tablets, Imitrex. Zonegran. Reglan and Zofran. Flexeril. Prednisone. Effexor, Pristiq, Wellbutrin, Cymbalta. Procedures Botox. Atenolol. On Cymbalta, Neurontin, lisinopril, Zanaflex, Topamax 300 mg by mouth daily. Took amitriptyline, nortriptyline. Relpax.    LDL 99, BUN 18, creatinine 0.8, TSH 0.6. BUN and creatinine were drawn in July 2017.  Review of Systems: Patient complains of symptoms per HPI as well as the following symptoms: no cp, no sob, no hx of cardiac problems. Pertinent negatives per HPI. All others negative.   Social History   Social History  . Marital status: Legally Separated    Spouse name: N/A  . Number of children: 1  . Years of education: 58   Occupational History  . Nurse    Social History Main Topics  . Smoking status: Never Smoker  . Smokeless tobacco: Never Used  . Alcohol use Yes     Comment: social  . Drug use: No  . Sexual activity: Not on file   Other Topics Concern  . Not on file   Social History Narrative   Lives with daughter   Caffeine use: rare    Family History  Problem Relation Age of Onset  . Migraines Mother   . Migraines Other     Past Medical History:  Diagnosis Date  . Headache(784.0)    migraines  . Hypothyroidism     Past Surgical History:  Procedure Laterality Date  . TUBAL LIGATION    . uterine ablation    . uterine polyps      Current Outpatient Prescriptions  Medication Sig Dispense Refill  . ALPRAZolam (XANAX) 1 MG tablet Take 1 mg by mouth at bedtime.  5  . amitriptyline (ELAVIL) 50 MG tablet TAKE (1) TABLET BY MOUTH AT BEDTIME. 30 tablet 11  . butalbital-acetaminophen-caffeine (FIORICET, ESGIC) 50-325-40 MG tablet Take 1 tablet by mouth every 6 (six) hours as needed for headache. 10 tablet 3  . candesartan (ATACAND) 8 MG tablet Take 1/2 tablet (4mg ) for one week, if no side effects increase to one tablet (8mg ). 120 tablet 3  . FEVERFEW PO Take 1 Dose by mouth  daily.    Marland Kitchen ibuprofen (ADVIL,MOTRIN) 200 MG tablet Take 800 mg by mouth every 6 (six) hours as needed for headache or mild pain.    Marland Kitchen levothyroxine (SYNTHROID, LEVOTHROID) 150 MCG tablet Take 150 mcg by mouth daily before breakfast.    . Multiple Vitamin (MULTIVITAMIN WITH MINERALS) TABS tablet Take 1 tablet by mouth at bedtime.    . ondansetron (ZOFRAN ODT) 4 MG disintegrating tablet Take 1-2 tablets (4 mg total) by mouth every 8 (eight) hours as needed 30 tablet 12  . prochlorperazine (COMPAZINE) 10 MG tablet Take 1 tablet (10 mg total) by mouth 2 (two) times daily as needed for nausea or vomiting (Nausea ). 10 tablet 0  . SUMAtriptan (IMITREX) 100 MG tablet Take 1 tablet (100 mg total) by mouth every 2 (two) hours as needed for migraine. Max: 2 doses per day 10 tablet 5  . SUMAtriptan 6 MG/0.5ML SOAJ Inject 1 Dose as directed as directed.  2  . tiZANidine (ZANAFLEX) 4 MG tablet Take  1 tablet (4 mg total) by mouth every 6 (six) hours as needed for muscle spasms (spasms). 90 tablet 5  . Topiramate ER (QUDEXY XR) 150 MG CS24 Take 300 mg by mouth at bedtime. 60 each 12  . traMADol (ULTRAM) 50 MG tablet Take 1 tablet (50 mg total) by mouth every 6 (six) hours as needed. 90 tablet 5  . UNABLE TO FIND Inject 1 Dose as directed as directed. Med Name: Aimovig injection    . vitamin C (ASCORBIC ACID) 500 MG tablet Take 500 mg by mouth at bedtime.     No current facility-administered medications for this visit.     Allergies as of 09/19/2016  . (No Known Allergies)    Vitals: Pulse (!) 108   Ht 5\' 7"  (1.702 m)   Wt 133 lb 12.8 oz (60.7 kg)   BMI 20.96 kg/m  Last Weight:  Wt Readings from Last 1 Encounters:  09/19/16 133 lb 12.8 oz (60.7 kg)   Last Height:   Ht Readings from Last 1 Encounters:  09/19/16 5\' 7"  (1.702 m)     Physical exam: Exam: Gen: NAD, conversant, well nourised, obese, well groomed                     CV: RRR, no MRG. No Carotid Bruits. No peripheral edema, warm,  nontender Eyes: Conjunctivae clear without exudates or hemorrhage MSK: Pain on palpation midline and bilateral occipital areas.  Neuro: Detailed Neurologic Exam  Speech:    Speech is normal; fluent and spontaneous with normal comprehension.  Cognition:    The patient is oriented to person, place, and time;     recent and remote memory intact;     language fluent;     normal attention, concentration,     fund of knowledge Cranial Nerves:    The pupils are equal, round, and reactive to light. The fundi are normal and spontaneous venous pulsations are present. Visual fields are full to finger confrontation. Extraocular movements are intact. Trigeminal sensation is intact and the muscles of mastication are normal. The face is symmetric. The palate elevates in the midline. Hearing intact. Voice is normal. Shoulder shrug is normal. The tongue has normal motion without fasciculations.   Coordination:    Normal finger to nose and heel to shin. Normal rapid alternating movements.   Gait:    Heel-toe and tandem gait are normal.   Motor Observation:    No asymmetry, no atrophy, and no involuntary movements noted. Tone:    Normal muscle tone.    Posture:    Posture is normal. normal erect    Strength:    Strength is V/V in the upper and lower limbs.      Sensation: intact to LT     Reflex Exam:  DTR's:    Deep tendon reflexes in the upper and lower extremities are normal bilaterally.   Toes:    The toes are downgoing bilaterally.   Clonus:    Clonus is absent.  Assessment/Plan:46 year old with chronic migraines without aura without status migrainosus not intractable.  Remember to drink plenty of fluid, eat healthy meals and do not skip any meals. Try to eat protein with every meal and eat a healthy snack such as fruit or nuts in between meals. Try to keep a regular sleep-wake schedule and try to exercise daily, particularly in the form of walking, 20-30 minutes a day, if you  can.   Increase Candesartan, discussed that this is  a blood pressure medication and to stop for any significant side effects including lightheadedness, dizziness, chest pain or anything concerning. Continue Compazine or Zofran with Imitrex or for nausea. Continue tizanidine for muscle spasms and also for headaches. Continue Amitriptyline to 50mg  before bed. Can further increase if needed.  Continue Qudexy Continue Botox CGRP for migraine: Unclear if this was coincidental or if severe headache due to this new medication, we'll hold off on next injectable.  Acute management: Imitex injectable. Try combining with zofran and an NSAID    Sarina Ill, MD  [facility] [address] [place], [place] [address]  Phone [phone] Fax [phone]  A total of 30 minutes was spent face-to-face with this patient. Over half this time was spent on counseling patient on the acute migraine, intractanble diagnosis and different diagnostic and therapeutic options available.

## 2016-09-19 NOTE — Patient Instructions (Addendum)
Remember to drink plenty of fluid, eat healthy meals and do not skip any meals. Try to eat protein with a every meal and eat a healthy snack such as fruit or nuts in between meals. Try to keep a regular sleep-wake schedule and try to exercise daily, particularly in the form of walking, 20-30 minutes a day, if you can.   As far as your medications are concerned, I would like to suggest: Increase Amitriptyline 100mg  Increase Candesartan  As far as diagnostic testing: MRI brain and MRV  I would like to see you back in one week, sooner if we need to. Please call us with any interim questions, concerns, problems, updates or refill requests.     Our phone number is [phone]. We also have an after hours call service for urgent matters and there is a physician on-call for urgent questions. For any emergencies you know to call 911 or go to the nearest emergency room  Candesartan tablets What is this medicine? CANDESARTAN (kan des AR tan) is used to treat high blood pressure in children and adults. This drug is also used to treat adults with heart failure. This medicine may be used for other purposes; ask your health care provider or pharmacist if you have questions. COMMON BRAND NAME(S): Atacand What should I tell my health care provider before I take this medicine? They need to know if you have any of these conditions: -heart failure -kidney or liver disease -if you are on a special diet, such as a low salt diet -an unusual or allergic reaction to candesartan, other medicines, foods, dyes, or preservatives -pregnant or trying to get pregnant -breast-feeding How should I use this medicine? Take this medicine by mouth with a glass of water. Follow the directions on the prescription label. This medicine can be taken with or without food. Take your doses at regular intervals. Do not take your medicine more often than directed. Talk to your pediatrician regarding the use of this medicine in  children. While this drug may be prescribed for children as young as 1 year for selected conditions, precautions do apply. For children who are unable to swallow tablets, the pharmacist can prepare the medicine in an oral suspension. Overdosage: If you think you have taken too much of this medicine contact a poison control center or emergency room at once. NOTE: This medicine is only for you. Do not share this medicine with others. What if I miss a dose? If you miss a dose, take it as soon as you can. If it is almost time for your next dose, take only that dose. Do not take double or extra doses. What may interact with this medicine? -blood pressure medicines -diuretics, especially triamterene, spironolactone, or amiloride -lithium -NSAIDs, medicines for pain and inflammation, like ibuprofen or naproxen -potassium salts or potassium supplements This list may not describe all possible interactions. Give your health care provider a list of all the medicines, herbs, non-prescription drugs, or dietary supplements you use. Also tell them if you smoke, drink alcohol, or use illegal drugs. Some items may interact with your medicine. What should I watch for while using this medicine? Visit your doctor or health care professional for regular checks on your progress. Check your blood pressure as directed. Ask your doctor or health care professional what your blood pressure should be and when you should contact him or her. Call your doctor or health care professional if you notice an irregular or fast heart beat. Women should inform their  doctor if they wish to become pregnant or think they might be pregnant. There is a potential for serious side effects to an unborn child, particularly in the second or third trimester. Talk to your health care professional or pharmacist for more information. You may get drowsy or dizzy. Do not drive, use machinery, or do anything that needs mental alertness until you know how  this drug affects you. Do not stand or sit up quickly, especially if you are an older patient. This reduces the risk of dizzy or fainting spells. Avoid salt substitutes unless you are told otherwise by your doctor or health care professional. Do not treat yourself for coughs, colds, or pain while you are taking this medicine without asking your doctor or health care professional for advice. Some ingredients may increase your blood pressure. What side effects may I notice from receiving this medicine? Side effects that you should report to your doctor or health care professional as soon as possible: -allergic reactions like skin rash, itching or hives, swelling of the face, lips, or tongue -breathing problems -chest pain -decreased amount of urine passed -fast or irregular heart beat -feeling faint or lightheaded, falls -swelling of your hands or feet Side effects that usually do not require medical attention (report to your doctor or health care professional if they continue or are bothersome): -change in sex drive or performance -cough -headache -nausea or stomach pain This list may not describe all possible side effects. Call your doctor for medical advice about side effects. You may report side effects to FDA at 1-800-FDA-1088. Where should I keep my medicine? Keep out of the reach of children. Store at room temperature between 15 and 30 degrees C (59 and 86 degrees F). Protect from light. Keep container tightly closed. Throw away any unused medicine after the expiration date. NOTE: This sheet is a summary. It may not cover all possible information. If you have questions about this medicine, talk to your doctor, pharmacist, or health care provider.  2018 Elsevier/Gold Standard (2007-11-05 10:07:40)

## 2016-09-20 ENCOUNTER — Ambulatory Visit: Payer: Self-pay | Admitting: Neurology

## 2016-09-20 NOTE — Progress Notes (Signed)
Fax confirmation received compazine [facility].  [phone].

## 2016-09-20 NOTE — Telephone Encounter (Signed)
I called and rescheduled apt.

## 2016-09-21 ENCOUNTER — Ambulatory Visit (INDEPENDENT_AMBULATORY_CARE_PROVIDER_SITE_OTHER): Payer: BLUE CROSS/BLUE SHIELD | Admitting: Neurology

## 2016-09-21 ENCOUNTER — Telehealth: Payer: Self-pay | Admitting: Neurology

## 2016-09-21 VITALS — BP 94/62

## 2016-09-21 DIAGNOSIS — R519 Headache, unspecified: Secondary | ICD-10-CM

## 2016-09-21 DIAGNOSIS — R51 Headache: Secondary | ICD-10-CM

## 2016-09-21 NOTE — Progress Notes (Signed)
error 

## 2016-09-21 NOTE — Telephone Encounter (Signed)
I am unable to get the MRI Brain and MRV approved on my level.. The MRV Head wo is the same CPT code as the MRA Head. I normally can't get these 2 combination approved. The phone number for the peer to peer is [phone]. The member ID is [id number] & DOB is 05-10-1970. The case does close on Monday 09/25/16.

## 2016-09-24 NOTE — Progress Notes (Signed)
[facility]    Provider:  Dr Jaynee Eagles Referring Provider: Sinda Du, MD Primary Care Physician:  Sinda Du, MD   CC:  New onset headache  HPI:  Martha Aguilar is a 46 y.o. female here as a referral from Dr. Luan Pulling for new onset severe headache, new symptoms, positional, burning pain occipitally, vision changes and severe.  No meningismus or stiff neck, fevers, AMS. Here with mother who provides information. Recommend ED, they decline, will order imaging of the brain.  Review of Systems: Patient complains of symptoms per HPI as well as the following symptoms: no CP, no SOB. Pertinent negatives and positives per HPI. All others negative.   Social History   Social History  . Marital status: Legally Separated    Spouse name: N/A  . Number of children: 1  . Years of education: 19   Occupational History  . Nurse    Social History Main Topics  . Smoking status: Never Smoker  . Smokeless tobacco: Never Used  . Alcohol use Yes     Comment: social  . Drug use: No  . Sexual activity: Not on file   Other Topics Concern  . Not on file   Social History Narrative   Lives with daughter   Caffeine use: rare    Family History  Problem Relation Age of Onset  . Migraines Mother   . Migraines Other     Past Medical History:  Diagnosis Date  . Headache(784.0)    migraines  . Hypothyroidism     Past Surgical History:  Procedure Laterality Date  . TUBAL LIGATION    . uterine ablation    . uterine polyps      Current Outpatient Prescriptions  Medication Sig Dispense Refill  . ALPRAZolam (XANAX) 1 MG tablet Take 1 mg by mouth at bedtime.  5  . amitriptyline (ELAVIL) 50 MG tablet Take 2 tablets (100 mg total) by mouth at bedtime. 30 tablet 11  . candesartan (ATACAND) 8 MG tablet Take 2 tablets (16 mg total) by mouth daily. 180 tablet 3  . levothyroxine (SYNTHROID, LEVOTHROID) 150 MCG tablet Take 150 mcg by mouth daily before breakfast.    .  Multiple Vitamin (MULTIVITAMIN WITH MINERALS) TABS tablet Take 1 tablet by mouth at bedtime.    . ondansetron (ZOFRAN ODT) 4 MG disintegrating tablet Take 1-2 tablets (4 mg total) by mouth every 8 (eight) hours as needed 30 tablet 12  . prochlorperazine (COMPAZINE) 10 MG tablet Take 1 tablet (10 mg total) by mouth every 8 (eight) hours as needed for nausea or vomiting (Nausea). 45 tablet 6  . tiZANidine (ZANAFLEX) 4 MG tablet Take 1 tablet (4 mg total) by mouth every 6 (six) hours as needed for muscle spasms (spasms). 90 tablet 5  . Topiramate ER (QUDEXY XR) 150 MG CS24 Take 300 mg by mouth at bedtime. 60 each 12  . UNABLE TO FIND Inject 1 Dose as directed as directed. Med Name: Aimovig injection    . vitamin C (ASCORBIC ACID) 500 MG tablet Take 500 mg by mouth at bedtime.     No current facility-administered medications for this visit.     Allergies as of 09/21/2016  . (No Known Allergies)    Vitals: BP 94/62  Last Weight:  Wt Readings from Last 1 Encounters:  09/19/16 133 lb 12.8 oz (60.7 kg)   Last Height:   Ht Readings from Last 1 Encounters:  09/19/16 5\' 7"  (1.702 m)   Physical exam: Exam:  Gen: uncomfortable, headache                  CV: RRR, no MRG. No Carotid Bruits. No peripheral edema, warm, nontender Eyes: Conjunctivae clear without exudates or hemorrhage  Neuro: Detailed Neurologic Exam  Speech:    Speech is normal; fluent and spontaneous with normal comprehension.  Cognition:    The patient is oriented to person, place, and time;     recent and remote memory intact;     language fluent;     normal attention, concentration,     fund of knowledge Cranial Nerves:    The pupils are equal, round, and reactive to light.Left nasal blurring (?). Visual fields are full to finger confrontation. Extraocular movements are intact. Trigeminal sensation is intact and the muscles of mastication are normal. The face is symmetric. The palate elevates in the midline. Hearing  intact. Voice is normal. Shoulder shrug is normal. The tongue has normal motion without fasciculations.   Coordination:    Normal finger to nose and heel to shin. Normal rapid alternating movements.   Gait:    Heel-toe and tandem gait are normal.   Motor Observation:    No asymmetry, no atrophy, and no involuntary movements noted. Tone:    Normal muscle tone.    Posture:    Posture is normal. normal erect    Strength: Left mild hemiparesis otherwise strength is V/V in the upper and lower limbs.      Sensation: intact to LT     Reflex Exam:  DTR's:    Deep tendon reflexes in the upper and lower extremities are normal bilaterally.   Toes:    The toes are downgoing bilaterally.   Clonus:    Clonus is absent.       Assessment/Plan:  46 year old with new onset positional headache, severe, intractable, with vision changes and left-sided weakness. Left nasal blurring, papilledema (?)  Needs MRi of the brain to eval for new lesion, hemorrhage and MRV of the brain to eval for venous thrombosis due to focal deficit.  Discussed: To prevent or relieve headaches, try the following: Cool Compress. Lie down and place a cool compress on your head.  Avoid headache triggers. If certain foods or odors seem to have triggered your migraines in the past, avoid them. A headache diary might help you identify triggers.  Include physical activity in your daily routine. Try a daily walk or other moderate aerobic exercise.  Manage stress. Find healthy ways to cope with the stressors, such as delegating tasks on your to-do list.  Practice relaxation techniques. Try deep breathing, yoga, massage and visualization.  Eat regularly. Eating regularly scheduled meals and maintaining a healthy diet might help prevent headaches. Also, drink plenty of fluids.  Follow a regular sleep schedule. Sleep deprivation might contribute to headaches Consider biofeedback. With this mind-body technique, you learn to  control certain bodily functions - such as muscle tension, heart rate and blood pressure - to prevent headaches or reduce headache pain.    Proceed to emergency room if you experience new or worsening symptoms or symptoms do not resolve, if you have new neurologic symptoms or if headache is severe, or for any concerning symptom.   Provided education and documentation from American headache Society toolbox including articles on: chronic migraine medication overuse headache, chronic migraines, prevention of migraines, behavioral and other nonpharmacologic treatments for headache.   Sarina Ill, MD  [facility] [address] [place], [place]  [address]  Phone [phone] Fax [phone]  A total of 25 minutes was spent face-to-face with this patient. Over half this time was spent on counseling patient on the acute headache diagnosis and different diagnostic and therapeutic options available.

## 2016-09-25 NOTE — Telephone Encounter (Signed)
The case close's today 09/25/16.

## 2016-09-25 NOTE — Telephone Encounter (Signed)
Noted, thank you

## 2016-09-25 NOTE — Telephone Encounter (Signed)
I will call today thanks

## 2016-09-25 NOTE — Telephone Encounter (Signed)
[id number] approved. Valid 9/13-10/12.

## 2016-10-02 ENCOUNTER — Ambulatory Visit (INDEPENDENT_AMBULATORY_CARE_PROVIDER_SITE_OTHER): Payer: BLUE CROSS/BLUE SHIELD | Admitting: Neurology

## 2016-10-02 ENCOUNTER — Telehealth: Payer: Self-pay | Admitting: Neurology

## 2016-10-02 VITALS — BP 78/59 | HR 88

## 2016-10-02 DIAGNOSIS — I952 Hypotension due to drugs: Secondary | ICD-10-CM | POA: Diagnosis not present

## 2016-10-02 DIAGNOSIS — G43901 Migraine, unspecified, not intractable, with status migrainosus: Secondary | ICD-10-CM

## 2016-10-02 DIAGNOSIS — R42 Dizziness and giddiness: Secondary | ICD-10-CM | POA: Diagnosis not present

## 2016-10-02 MED ORDER — CANDESARTAN CILEXETIL 8 MG PO TABS
8.0000 mg | ORAL_TABLET | Freq: Every day | ORAL | 3 refills | Status: DC
Start: 2016-10-02 — End: 2017-02-19

## 2016-10-02 NOTE — Telephone Encounter (Signed)
Cyril Mourning, can you add patient at noon on Friday please on my schedule? Thank yoU!

## 2016-10-02 NOTE — Telephone Encounter (Signed)
Appt made for 1130 today for pt per Dr. Jaynee Eagles.   Trigger point/nerve block injections. Spoke to pt and she will come at .

## 2016-10-02 NOTE — Progress Notes (Addendum)
[facility]    Provider:  Dr Jaynee Eagles Referring Provider: Sinda Du, MD Primary Care Physician:  Sinda Du, MD  CC:  Migraine  HPI:  Martha Aguilar is a 46 y.o. female here as a referral from Dr. Luan Pulling for intractable migraine. Patient is here again for migraines, that started with new medication administration. She is dizzy which is a new symptom, recently started candesartan. We'll decrease candesartan. Advised patient to hydrate well. She has had daily migraines since starting the new CGRP Aimovig (erenumab). I do think these new medications will help patient, we will start her on the next CGRP medication just approved (AJOVY) instead of Erenumab.  Review of Systems: Patient complains of symptoms per HPI as well as the following symptoms: headache. Pertinent negatives and positives per HPI. All others negative.   Social History   Social History  . Marital status: Legally Separated    Spouse name: N/A  . Number of children: 1  . Years of education: 65   Occupational History  . Nurse    Social History Main Topics  . Smoking status: Never Smoker  . Smokeless tobacco: Never Used  . Alcohol use Yes     Comment: social  . Drug use: No  . Sexual activity: Not on file   Other Topics Concern  . Not on file   Social History Narrative   Lives with daughter   Caffeine use: rare    Family History  Problem Relation Age of Onset  . Migraines Mother   . Migraines Other     Past Medical History:  Diagnosis Date  . Headache(784.0)    migraines  . Hypothyroidism     Past Surgical History:  Procedure Laterality Date  . TUBAL LIGATION    . uterine ablation    . uterine polyps      Current Outpatient Prescriptions  Medication Sig Dispense Refill  . ALPRAZolam (XANAX) 1 MG tablet Take 1 mg by mouth at bedtime.  5  . amitriptyline (ELAVIL) 50 MG tablet Take 2 tablets (100 mg total) by mouth at bedtime. 30 tablet 11  . candesartan  (ATACAND) 8 MG tablet Take 2 tablets (16 mg total) by mouth daily. 180 tablet 3  . levothyroxine (SYNTHROID, LEVOTHROID) 150 MCG tablet Take 150 mcg by mouth daily before breakfast.    . Multiple Vitamin (MULTIVITAMIN WITH MINERALS) TABS tablet Take 1 tablet by mouth at bedtime.    . ondansetron (ZOFRAN ODT) 4 MG disintegrating tablet Take 1-2 tablets (4 mg total) by mouth every 8 (eight) hours as needed 30 tablet 12  . prochlorperazine (COMPAZINE) 10 MG tablet Take 1 tablet (10 mg total) by mouth every 8 (eight) hours as needed for nausea or vomiting (Nausea). 45 tablet 6  . tiZANidine (ZANAFLEX) 4 MG tablet Take 1 tablet (4 mg total) by mouth every 6 (six) hours as needed for muscle spasms (spasms). 90 tablet 5  . Topiramate ER (QUDEXY XR) 150 MG CS24 Take 300 mg by mouth at bedtime. 60 each 12  . UNABLE TO FIND Inject 1 Dose as directed as directed. Med Name: Aimovig injection    . vitamin C (ASCORBIC ACID) 500 MG tablet Take 500 mg by mouth at bedtime.     No current facility-administered medications for this visit.     Allergies as of 10/02/2016  . (No Known Allergies)    Vitals: BP (!) 78/59 (BP Location: Right Arm, Patient Position: Sitting, Cuff Size: Small) Comment: sys doppler 54  Pulse 88  Last Weight:  Wt Readings from Last 1 Encounters:  09/19/16 133 lb 12.8 oz (60.7 kg)   Last Height:   Ht Readings from Last 1 Encounters:  09/19/16 5\' 7"  (1.702 m)    Physical exam: Exam: Gen: NAD, conversant, well nourised, obese, well groomed                     CV: RRR, no MRG. No Carotid Bruits. No peripheral edema, warm, nontender Eyes: Conjunctivae clear without exudates or hemorrhage  Neuro: Detailed Neurologic Exam  Speech:    Speech is normal; fluent and spontaneous with normal comprehension.  Cognition:    The patient is oriented to person, place, and time;     recent and remote memory intact;     language fluent;     normal attention, concentration,     fund of  knowledge Cranial Nerves:    The pupils are equal, round, and reactive to light. The fundi are normal and spontaneous venous pulsations are present. Visual fields are full to finger confrontation. Extraocular movements are intact. Trigeminal sensation is intact and the muscles of mastication are normal. The face is symmetric. The palate elevates in the midline. Hearing intact. Voice is normal. Shoulder shrug is normal. The tongue has normal motion without fasciculations.   Coordination:    Normal finger to nose and heel to shin. Normal rapid alternating movements.   Gait:    Heel-toe and tandem gait are normal.   Motor Observation:    No asymmetry, no atrophy, and no involuntary movements noted. Tone:    Normal muscle tone.    Posture:    Posture is normal. normal erect    Strength:    Strength is V/V in the upper and lower limbs.      Sensation: intact to LT     Reflex Exam:  DTR's:    Deep tendon reflexes in the upper and lower extremities are normal bilaterally.   Toes:    The toes are downgoing bilaterally.   Clonus:    Clonus is absent.      Assessment/Plan:   Patient with status migrainosus, here for treatment, and dizziness.   Continue Botox for migraines Decrease candesartan for migraine prevention due to dizziness and hypotension Continue Elavil in the evenings Continue Imitrex, Zofran or Compazine and tizanidine for acute management Continue topiramate in the evenings. Patient had side effects to the new CGRP Aimovig. Will switch to next to be approved Ajovy. Patient will come in on Friday when we get samples for injection.  Sarina Ill, MD  [facility] [address] [place], [place] [address]  Phone [phone] Fax [phone]  A total of 15 minutes was spent face-to-face with this patient. Over half this time was spent on counseling patient on the dizziness, hypotension, status migrainosus diagnosis and different  diagnostic and therapeutic options available.

## 2016-10-02 NOTE — Telephone Encounter (Signed)
Patient had 2 nerve blocks last week and is calling saying she needs another nerve block because of headaches.

## 2016-10-03 NOTE — Telephone Encounter (Signed)
Pt has been added to Dr. Cathren Laine schedule on 10/06/16 at 12:00 noon.

## 2016-10-04 ENCOUNTER — Telehealth: Payer: Self-pay

## 2016-10-04 NOTE — Telephone Encounter (Signed)
Received request for PA for Ajovy for this pt from [place]. I completed pa for ajovy, submitted to [place]. Should have determination in 3-5 business days.

## 2016-10-06 ENCOUNTER — Encounter: Payer: Self-pay | Admitting: Neurology

## 2016-10-06 ENCOUNTER — Ambulatory Visit (INDEPENDENT_AMBULATORY_CARE_PROVIDER_SITE_OTHER): Payer: BLUE CROSS/BLUE SHIELD | Admitting: Neurology

## 2016-10-06 VITALS — BP 105/74 | HR 83 | Ht 67.0 in | Wt 139.2 lb

## 2016-10-06 DIAGNOSIS — G43011 Migraine without aura, intractable, with status migrainosus: Secondary | ICD-10-CM | POA: Diagnosis not present

## 2016-10-06 NOTE — Progress Notes (Signed)
[facility]    Provider:  Dr Jaynee Eagles Referring Provider: Sinda Du, MD Primary Care Physician:  Sinda Du, MD  CC:  Migraine  Interval history: Patient returns again for headaches, her blood pressure is significantly lower, we'll discontinue any blood pressure medications.  HPI:  Martha Aguilar is a 46 y.o. female here as a referral from Dr. Luan Pulling for intractable migraine. Patient is here again for migraines, that started with new medication administration. She is dizzy which is a new symptom, recently started candesartan. We'll decrease candesartan. Advised patient to hydrate well. She has had daily migraines since starting the new CGRP Aimovig (erenumab). I do think these new medications will help patient, we will start her on the next CGRP medication just approved (AJOVY) instead of Erenumab.  Review of Systems: Patient complains of symptoms per HPI as well as the following symptoms: headache. Pertinent negatives and positives per HPI. All others negative.  Social History   Social History  . Marital status: Legally Separated    Spouse name: N/A  . Number of children: 1  . Years of education: 42   Occupational History  . Nurse    Social History Main Topics  . Smoking status: Never Smoker  . Smokeless tobacco: Never Used  . Alcohol use Yes     Comment: social  . Drug use: No  . Sexual activity: Not on file   Other Topics Concern  . Not on file   Social History Narrative   Lives with daughter   Caffeine use: rare    Family History  Problem Relation Age of Onset  . Migraines Mother   . Migraines Other     Past Medical History:  Diagnosis Date  . Headache(784.0)    migraines  . Hypothyroidism     Past Surgical History:  Procedure Laterality Date  . TUBAL LIGATION    . uterine ablation    . uterine polyps      Current Outpatient Prescriptions  Medication Sig Dispense Refill  . ALPRAZolam (XANAX) 1 MG tablet Take 1 mg  by mouth at bedtime.  5  . amitriptyline (ELAVIL) 50 MG tablet Take 2 tablets (100 mg total) by mouth at bedtime. 30 tablet 11  . candesartan (ATACAND) 8 MG tablet Take 1 tablet (8 mg total) by mouth daily. 180 tablet 3  . levothyroxine (SYNTHROID, LEVOTHROID) 150 MCG tablet Take 150 mcg by mouth daily before breakfast.    . Multiple Vitamin (MULTIVITAMIN WITH MINERALS) TABS tablet Take 1 tablet by mouth at bedtime.    . ondansetron (ZOFRAN ODT) 4 MG disintegrating tablet Take 1-2 tablets (4 mg total) by mouth every 8 (eight) hours as needed 30 tablet 12  . prochlorperazine (COMPAZINE) 10 MG tablet Take 1 tablet (10 mg total) by mouth every 8 (eight) hours as needed for nausea or vomiting (Nausea). 45 tablet 6  . tiZANidine (ZANAFLEX) 4 MG tablet Take 1 tablet (4 mg total) by mouth every 6 (six) hours as needed for muscle spasms (spasms). 90 tablet 5  . Topiramate ER (QUDEXY XR) 150 MG CS24 Take 300 mg by mouth at bedtime. 60 each 12  . UNABLE TO FIND Inject 1 Dose as directed as directed. Med Name: Aimovig injection    . vitamin C (ASCORBIC ACID) 500 MG tablet Take 500 mg by mouth at bedtime.     No current facility-administered medications for this visit.     Allergies as of 10/06/2016 - Review Complete 10/06/2016  Allergen Reaction Noted  .  Aimovig [erenumab-aooe] Other (See Comments) 10/06/2016    Vitals: Ht 5\' 7"  (1.702 m)   Wt 139 lb 3.2 oz (63.1 kg)   BMI 21.80 kg/m  Last Weight:  Wt Readings from Last 1 Encounters:  10/06/16 139 lb 3.2 oz (63.1 kg)   Last Height:   Ht Readings from Last 1 Encounters:  10/06/16 5\' 7"  (1.702 m)     Physical exam: Exam: Gen: NAD, conversant, well nourised, well groomed                     CV: RRR, no MRG. No Carotid Bruits. No peripheral edema, warm, nontender Eyes: Conjunctivae clear without exudates or hemorrhage  Neuro: Detailed Neurologic Exam  Speech:    Speech is normal; fluent and spontaneous with normal comprehension.    Cognition:    The patient is oriented to person, place, and time;     recent and remote memory intact;     language fluent;     normal attention, concentration,     fund of knowledge Cranial Nerves:    The pupils are equal, round, and reactive to light. The fundi are normal and spontaneous venous pulsations are present. Visual fields are full to finger confrontation. Extraocular movements are intact. Trigeminal sensation is intact and the muscles of mastication are normal. The face is symmetric. The palate elevates in the midline. Hearing intact. Voice is normal. Shoulder shrug is normal. The tongue has normal motion without fasciculations.   Coordination:    Normal finger to nose and heel to shin. Normal rapid alternating movements.   Gait:    Heel-toe and tandem gait are normal.   Motor Observation:    No asymmetry, no atrophy, and no involuntary movements noted. Tone:    Normal muscle tone.    Posture:    Posture is normal. normal erect    Strength:    Strength is V/V in the upper and lower limbs.      Sensation: intact to LT     Reflex Exam:  DTR's:    Deep tendon reflexes in the upper and lower extremities are normal bilaterally.   Toes:    The toes are downgoing bilaterally.   Clonus:    Clonus is absent.     Assessment/Plan:   Assessment/Plan:   Patient with status migrainosus, here for treatment, and dizziness.   Continue Botox for migraines Stop candesartan for migraine prevention due to dizziness and hypotension Continue Elavil in the evenings Continue Imitrex, Zofran or Compazine and tizanidine for acute management Continue topiramate in the evenings. Patient had side effects to the new CGRP Aimovig. Will switch to next to be approved Ajovy. Patient will come in on Friday when we get samples for injection.  Martha Ill, MD  [facility] [address] [place], [place] [address]  Phone [phone] Fax  [phone]  A total of 25 minutes was spent face-to-face with this patient. Over half this time was spent on counseling patient on the status migrainosus diagnosis and different diagnostic and therapeutic options available.

## 2016-10-09 ENCOUNTER — Other Ambulatory Visit: Payer: Self-pay | Admitting: Neurology

## 2016-10-09 MED ORDER — METHYLPREDNISOLONE 4 MG PO TBPK: ORAL_TABLET | ORAL | 2 refills | Status: DC

## 2016-10-09 NOTE — Telephone Encounter (Signed)
Pt's Ajovy has been approved by [facility] from 10/04/16-01/02/2017. Ref: [id number]. Will inform [employer].

## 2016-10-10 ENCOUNTER — Telehealth: Payer: Self-pay | Admitting: Neurology

## 2016-10-10 NOTE — Telephone Encounter (Signed)
Martha Aguilar, would you mind calling patient to see if she wants to come at noon? Thank you!

## 2016-10-10 NOTE — Telephone Encounter (Signed)
Patient called in stating she had the AJOVY injection on Friday and states she is feeling a sensation feeling on the back of her head and would like to come in some time tomorrow for a nerve block.

## 2016-10-11 ENCOUNTER — Telehealth: Payer: Self-pay | Admitting: Neurology

## 2016-10-11 NOTE — Telephone Encounter (Signed)
Mark From [facility] is asking if there is a prior authorization on file for Botox, information can be left if he is not available when call back is made to [phone]

## 2016-10-11 NOTE — Telephone Encounter (Signed)
I called pt, offered her a nerve block today at noon with Dr. Jaynee Eagles. Pt declined the nerve block at this time but will call us back if she decides she needs one today at noon. Pt reports that she is feeling a little better today.

## 2016-10-12 ENCOUNTER — Other Ambulatory Visit: Payer: BLUE CROSS/BLUE SHIELD

## 2016-10-12 ENCOUNTER — Inpatient Hospital Stay: Admission: RE | Admit: 2016-10-12 | Payer: BLUE CROSS/BLUE SHIELD | Source: Ambulatory Visit

## 2016-10-12 NOTE — Telephone Encounter (Signed)
I called and informed them it was still pending authorization.   Martha Aguilar, this is the patient that needs to be checked on tomorrow.

## 2016-10-13 ENCOUNTER — Other Ambulatory Visit: Payer: Self-pay | Admitting: Neurology

## 2016-10-13 NOTE — Progress Notes (Signed)
Samples given: AJOVY  LOT [id number] exp 2/20 X 2 225mg /1.29ml.  sy/RN

## 2016-10-13 NOTE — Telephone Encounter (Signed)
Did you want to continue on tramadol?   Did not see on her current med list.

## 2016-10-13 NOTE — Telephone Encounter (Signed)
PA was approved fax conformation received called [employer] gave them conformation of approval . Called and spoke to patient she is going to call and give them consent I will follow back up with her on Monday .  I will call [facility] back on Monday for Deliver for Monday for Botox Medication.

## 2016-10-16 DIAGNOSIS — G43709 Chronic migraine without aura, not intractable, without status migrainosus: Secondary | ICD-10-CM | POA: Diagnosis not present

## 2016-10-16 NOTE — Telephone Encounter (Signed)
Patient called and gave consent. Botox will be delivered 10/17/2016 around noon.

## 2016-10-17 ENCOUNTER — Ambulatory Visit (INDEPENDENT_AMBULATORY_CARE_PROVIDER_SITE_OTHER): Payer: BLUE CROSS/BLUE SHIELD | Admitting: Neurology

## 2016-10-17 ENCOUNTER — Encounter: Payer: Self-pay | Admitting: Neurology

## 2016-10-17 ENCOUNTER — Other Ambulatory Visit: Payer: Self-pay | Admitting: Neurology

## 2016-10-17 VITALS — BP 124/76 | HR 100 | Ht 67.0 in | Wt 139.8 lb

## 2016-10-17 DIAGNOSIS — G43709 Chronic migraine without aura, not intractable, without status migrainosus: Secondary | ICD-10-CM

## 2016-10-17 MED ORDER — TRAMADOL HCL 50 MG PO TABS: 50.0000 mg | ORAL_TABLET | Freq: Four times a day (QID) | ORAL | 5 refills | Status: DC | PRN

## 2016-10-17 NOTE — Telephone Encounter (Signed)
Noted, thank you

## 2016-10-17 NOTE — Progress Notes (Signed)
Botox 100 units x 2 vials, NDC 0940-7680-88, LOT # [id number], EXP: 03/2019, Specialty Pharmacy//BC RN

## 2016-10-17 NOTE — Progress Notes (Signed)

## 2016-10-17 NOTE — Telephone Encounter (Signed)
Pt has appt with Dr. Jaynee Eagles today.

## 2016-10-18 ENCOUNTER — Ambulatory Visit: Payer: BLUE CROSS/BLUE SHIELD | Admitting: Neurology

## 2016-10-18 NOTE — Progress Notes (Signed)
Faxed Rx for Tramadol to [facility]. Received receipt of confirmation.

## 2016-10-19 ENCOUNTER — Ambulatory Visit: Payer: BLUE CROSS/BLUE SHIELD | Admitting: Neurology

## 2016-10-25 ENCOUNTER — Inpatient Hospital Stay: Admission: RE | Admit: 2016-10-25 | Payer: BLUE CROSS/BLUE SHIELD | Source: Ambulatory Visit

## 2016-10-26 ENCOUNTER — Other Ambulatory Visit: Payer: Self-pay | Admitting: *Deleted

## 2016-10-26 MED ORDER — FREMANEZUMAB-VFRM 225 MG/1.5ML ~~LOC~~ SOSY: 1.0000 "pen " | PREFILLED_SYRINGE | SUBCUTANEOUS | 11 refills | Status: DC

## 2016-10-26 NOTE — Progress Notes (Unsigned)
Rx Ajovy sent to [facility]

## 2016-11-03 ENCOUNTER — Telehealth: Payer: Self-pay | Admitting: Neurology
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
## 2016-11-06 ENCOUNTER — Other Ambulatory Visit: Payer: BLUE CROSS/BLUE SHIELD

## 2016-11-07 NOTE — Telephone Encounter (Signed)
Called the patient back. Dr. Jaynee Eagles said she could come & get sample of Ajovy. The patient asked for a prescription for tapering off Topamax. She says she has been tapering it herself at home and is probably down to 75 mg. I told her I would d/w Dr. Jaynee Eagles. She asked for the prescription to be sent to [facility]. Also she states she got a letter in the mail saying that her Arie Sabina was approved and that she would need to get it from [employer]. She said she has not received the medication yet. She verbalized understanding and had no further questions.  I called [facility] and spoke with a pharmacist there to verify that they have the approval of Ajovy. She confirmed and stated that the next step is for them to call the patient and schedule a delivery of Ajovy to her home.

## 2016-11-07 NOTE — Telephone Encounter (Signed)
I spoke with Dr. Jaynee Eagles, she will provide the patient with Qudexy XR samples for tapering off with the following schedule:   Take 50 mg daily x 1 week followed by   25 mg daily x 1 week then STOP medication.   I called the patient and made her aware that we would provide the samples for tapering. She verbalized understanding and stated she would come pick them up along with Ajovy samples tomorrow. She is also aware that [facility] will be contacting her to setup delivery time of Ajovy.

## 2016-11-09 ENCOUNTER — Other Ambulatory Visit: Payer: Self-pay | Admitting: *Deleted

## 2016-11-09 MED ORDER — TOPIRAMATE ER 25 MG PO SPRINKLE CAP24: 25.0000 mg | EXTENDED_RELEASE_CAPSULE | Freq: Every day | ORAL | 0 refills | Status: DC

## 2016-11-09 MED ORDER — FREMANEZUMAB-VFRM 225 MG/1.5ML ~~LOC~~ SOSY: 225.0000 mg | PREFILLED_SYRINGE | SUBCUTANEOUS | 0 refills | Status: DC

## 2016-11-09 MED ORDER — TOPIRAMATE ER 50 MG PO SPRINKLE CAP24: 50.0000 mg | EXTENDED_RELEASE_CAPSULE | Freq: Every day | ORAL | 0 refills | Status: DC

## 2016-11-09 NOTE — Progress Notes (Unsigned)
Samples given to patient:   Ajovy 225 mg/1.5 mL SOSY x 2 pens given to pt. See order for LOT/EXP/NDC #s.  Qudexy ER 50 mg tablets x 30 given to pt. See order for LOT/EXP/NDC #s.  Qudexy ER 25 mg tablets x 30 given to pt. See order for LOT/EXP/NDC #s.   Patient verbalized understanding of instructions of the above medications. She will inject Ajovy 1 pen into skin every 30 days. She will take Qudexy 50 mg daily x 7 days followed by 25 mg x 7 days then stop medication.

## 2016-11-13 NOTE — Telephone Encounter (Signed)
Called [facility] and spoke with Elmo Putt a Software engineer. I verified with her that pt is taking both at the same time. She states that pt will be able to continue both and they will dispense the Ajovy.

## 2016-11-13 NOTE — Telephone Encounter (Signed)
Drake with [employer] is calling regarding medication Fremanezumab-vfrm (AJOVY) 225 MG/1.5ML SOSY. He wants to know if the patient is still taking Botox.

## 2016-12-21 ENCOUNTER — Other Ambulatory Visit: Payer: Self-pay | Admitting: Neurology

## 2016-12-25 NOTE — Telephone Encounter (Signed)
For all patients, continue PAs and she should get Ajovy anywat throught hte bridge program until 12/2017 thanks

## 2016-12-25 NOTE — Telephone Encounter (Signed)
Called and LVM (ok per DPR) on pt's cell phone. Informed patient that the Ajovy got denied due to simultaneous use of Botox. I advised patient that she should still be able to get it free with the savings card and if she has any issues getting the medication or if she has any questions call the office.

## 2016-12-25 NOTE — Telephone Encounter (Signed)
12/22/16- [employer] has denied Ajovy due to use of Botox (taken within last 3 months) while taking Ajovy.

## 2016-12-26 ENCOUNTER — Other Ambulatory Visit: Payer: Self-pay | Admitting: Neurology

## 2016-12-26 ENCOUNTER — Telehealth: Payer: Self-pay | Admitting: *Deleted

## 2016-12-26 MED ORDER — SUMATRIPTAN SUCCINATE 6 MG/0.5ML ~~LOC~~ SOLN: SUBCUTANEOUS | 6 refills | Status: DC

## 2016-12-26 NOTE — Telephone Encounter (Signed)
Received a refill request for Sumatriptan 6 mg/0.5 mL injection. Med not on pt's active list. Called patient and she stated that Dr. Jaynee Eagles had said she could have it again at her office visit. Informed her that I would consult Dr. Jaynee Eagles before filling and she verbalized understanding. She inquired when she would be scheduled again for Botox and when her next f/u is. I confirmed her appt w/ Dr. Jaynee Eagles for office visit and told her that I would consult Danielle for scheduling next botox treatment. She will also come by to get the Ajovy savings card and will call the information in to her mail order pharmacy. She verbalized understanding and appreciation for the call.

## 2016-12-26 NOTE — Telephone Encounter (Signed)
Sure, I ordered it to her pharmacy. Thanks

## 2016-12-27 NOTE — Telephone Encounter (Signed)
The reason indicated in writing is that patient is simultaneously on Botox. The patient is going to pick up a savings card from the office and call her mail order pharmacy with the information.

## 2016-12-27 NOTE — Telephone Encounter (Signed)
Per cover my meds, ajovy was denied but no reason was given. However, the previous message here seems like she is getting the medication.

## 2016-12-27 NOTE — Telephone Encounter (Signed)
I called and scheduled the patient for her next injection.  °

## 2017-01-23 ENCOUNTER — Telehealth: Payer: Self-pay | Admitting: Neurology

## 2017-01-23 ENCOUNTER — Telehealth: Payer: Self-pay | Admitting: *Deleted

## 2017-01-23 ENCOUNTER — Ambulatory Visit: Payer: BLUE CROSS/BLUE SHIELD | Admitting: Neurology

## 2017-01-23 NOTE — Telephone Encounter (Signed)
Noted, thank you

## 2017-01-23 NOTE — Telephone Encounter (Signed)
Billing, I will wait to hear back from you guys.

## 2017-01-23 NOTE — Telephone Encounter (Signed)
Patient canceled Botox appt on same day d/t migraine.

## 2017-01-23 NOTE — Telephone Encounter (Signed)
Pt c/a botox appt today, she has a "bad" migraine". Pt was advised of the no show policy and she would need to pay before an appt could be r/s. Billing dept if you would please call. Thank you

## 2017-01-27 ENCOUNTER — Other Ambulatory Visit: Payer: Self-pay | Admitting: Neurology

## 2017-01-30 ENCOUNTER — Telehealth: Payer: Self-pay | Admitting: Neurology

## 2017-01-30 ENCOUNTER — Telehealth: Payer: Self-pay | Admitting: *Deleted

## 2017-01-30 ENCOUNTER — Encounter: Payer: Self-pay | Admitting: Neurology

## 2017-01-30 ENCOUNTER — Encounter: Payer: Self-pay | Admitting: *Deleted

## 2017-01-30 NOTE — Telephone Encounter (Signed)
Called patient. She stated that she is starting to have more headaches. She is wondering if she should start back on a low dose Topamax. She stated she was unable to get the Ajovy d/t also receiving Botox. I informed her that there is a copay card which should allow her to get the medication free for a year even if insurance denies. I told the patient that I will call [facility] to give them the information from the $0 copay card. It will be here for her to pickup from the office. The patient will also come to the office tomorrow to get a sample of Ajovy as she is due for his next dose on 1/30.

## 2017-01-30 NOTE — Telephone Encounter (Signed)
Pt states that she missed an appointment and would like to know if there are any samples of Fremanezumab-vfrm (AJOVY) 225 MG/1.5ML SOSY.  Also re: the Aimovig pt would like to know if Dr Jaynee Eagles wants her to take the Aimovig  Or wait for office sample of the Ajovy.  Please call

## 2017-01-30 NOTE — Telephone Encounter (Addendum)
Spoke with [facility]. I discussed that Ajovy has been denied due to use of Botox. I gave her the Ajovy savings card information for an attempt to get the Ajovy free regardless of insurance denial. She stated they would call the office if any issues.  BIN# [id number] PCN # [id number] GRP# [id number] ID# [id number]  Fax: [phone]  Attn: PFS  I faxed copies of the card and insert for pharmacy support to [facility]. Received a receipt of confirmation.

## 2017-01-30 NOTE — Telephone Encounter (Signed)
Faxed signed prescription to [facility]. Received a receipt of confirmation.

## 2017-01-31 MED ORDER — FREMANEZUMAB-VFRM 225 MG/1.5ML ~~LOC~~ SOSY: 1.0000 | PREFILLED_SYRINGE | SUBCUTANEOUS | 0 refills | Status: DC

## 2017-01-31 NOTE — Addendum Note (Signed)
Addended by: Gildardo Griffes on: 01/31/2017 05:01 PM   Modules accepted: Orders

## 2017-01-31 NOTE — Telephone Encounter (Signed)
Per Dr. Jaynee Eagles, ok to provide patient with Ajovy sample. Patient given 1 box along with savings card for $0 copay.

## 2017-02-15 DIAGNOSIS — G43709 Chronic migraine without aura, not intractable, without status migrainosus: Secondary | ICD-10-CM | POA: Diagnosis not present

## 2017-02-19 ENCOUNTER — Telehealth: Payer: Self-pay | Admitting: Neurology

## 2017-02-19 ENCOUNTER — Encounter: Payer: Self-pay | Admitting: Neurology

## 2017-02-19 ENCOUNTER — Ambulatory Visit: Payer: BLUE CROSS/BLUE SHIELD | Admitting: Neurology

## 2017-02-19 VITALS — BP 86/59 | HR 80

## 2017-02-19 DIAGNOSIS — G43709 Chronic migraine without aura, not intractable, without status migrainosus: Secondary | ICD-10-CM

## 2017-02-19 MED ORDER — GALCANEZUMAB-GNLM 120 MG/ML ~~LOC~~ SOAJ: 120.0000 mg | SUBCUTANEOUS | 11 refills | Status: DC

## 2017-02-19 MED ORDER — GALCANEZUMAB-GNLM 120 MG/ML ~~LOC~~ SOAJ: 240.0000 mg | Freq: Once | SUBCUTANEOUS | 0 refills | Status: AC

## 2017-02-19 NOTE — Telephone Encounter (Signed)
Called patient and informed her that Em[facility] was sent to [facility] on [address]. She verbalized appreciation and will call us if she any issues.

## 2017-02-19 NOTE — Progress Notes (Signed)
Botox- 100 units x 2 vials Lot: [id number] Expiration: 05/2019 NDC: 0865-7846-96  Bacteriostatic 0.9% Sodium Chloride- 12mL total Lot: [id number] Expiration: 05/10/2018 NDC: 1324-4010-27  Dx: O53.664 S/P  //BCrn

## 2017-02-19 NOTE — Telephone Encounter (Signed)
Would you mind letting her know I sent it to the [facility] in [place], she should bring that copay card with her. Any problems calls [name].

## 2017-02-19 NOTE — Progress Notes (Signed)

## 2017-02-22 NOTE — Telephone Encounter (Signed)
Completed PA for [employer] on Cover My Meds  (Key: [id number])   Anticipate a determination within 3 business days.

## 2017-02-27 ENCOUNTER — Encounter: Payer: Self-pay | Admitting: *Deleted

## 2017-02-27 NOTE — Telephone Encounter (Signed)
That's fine, I think the have a copay card but we will have to decide on botox vs emgality at some point. thanks

## 2017-02-27 NOTE — Telephone Encounter (Addendum)
Received determination from [place]. Emgality has been denied due to the fact that patient is also on Botox.   Emailed patient to inform her. Pt should have copay card. Patient told to call if she has issues receiving medication with copay card.

## 2017-03-05 ENCOUNTER — Encounter (HOSPITAL_COMMUNITY): Payer: Self-pay

## 2017-03-05 ENCOUNTER — Emergency Department (HOSPITAL_COMMUNITY): Payer: BLUE CROSS/BLUE SHIELD

## 2017-03-05 ENCOUNTER — Other Ambulatory Visit: Payer: Self-pay

## 2017-03-05 ENCOUNTER — Emergency Department (HOSPITAL_COMMUNITY)
Admission: EM | Admit: 2017-03-05 | Discharge: 2017-03-05 | Disposition: A | Discharge status: Home / Self Care | Payer: BLUE CROSS/BLUE SHIELD | Attending: Emergency Medicine | Admitting: Emergency Medicine

## 2017-03-05 DIAGNOSIS — S300XXA Contusion of lower back and pelvis, initial encounter: Secondary | ICD-10-CM | POA: Diagnosis not present

## 2017-03-05 DIAGNOSIS — Y999 Unspecified external cause status: Secondary | ICD-10-CM | POA: Diagnosis not present

## 2017-03-05 DIAGNOSIS — Y92008 Other place in unspecified non-institutional (private) residence as the place of occurrence of the external cause: Secondary | ICD-10-CM | POA: Insufficient documentation

## 2017-03-05 DIAGNOSIS — S299XXA Unspecified injury of thorax, initial encounter: Secondary | ICD-10-CM | POA: Diagnosis not present

## 2017-03-05 DIAGNOSIS — S3992XA Unspecified injury of lower back, initial encounter: Secondary | ICD-10-CM | POA: Diagnosis not present

## 2017-03-05 DIAGNOSIS — E039 Hypothyroidism, unspecified: Secondary | ICD-10-CM | POA: Diagnosis not present

## 2017-03-05 DIAGNOSIS — Z79899 Other long term (current) drug therapy: Secondary | ICD-10-CM | POA: Insufficient documentation

## 2017-03-05 DIAGNOSIS — Y9389 Activity, other specified: Secondary | ICD-10-CM | POA: Diagnosis not present

## 2017-03-05 DIAGNOSIS — W108XXA Fall (on) (from) other stairs and steps, initial encounter: Secondary | ICD-10-CM | POA: Diagnosis not present

## 2017-03-05 DIAGNOSIS — R0781 Pleurodynia: Secondary | ICD-10-CM | POA: Diagnosis not present

## 2017-03-05 DIAGNOSIS — W19XXXA Unspecified fall, initial encounter: Secondary | ICD-10-CM

## 2017-03-05 DIAGNOSIS — IMO0002 Reserved for concepts with insufficient information to code with codable children: Secondary | ICD-10-CM

## 2017-03-05 DIAGNOSIS — M545 Low back pain: Secondary | ICD-10-CM | POA: Diagnosis not present

## 2017-03-05 DIAGNOSIS — G43709 Chronic migraine without aura, not intractable, without status migrainosus: Secondary | ICD-10-CM | POA: Diagnosis not present

## 2017-03-05 LAB — URINALYSIS, DIPSTICK ONLY
Bilirubin Urine: NEGATIVE
Glucose, UA: NEGATIVE mg/dL
Hgb urine dipstick: NEGATIVE
Ketones, ur: NEGATIVE mg/dL
LEUKOCYTES UA: NEGATIVE
Nitrite: NEGATIVE
Protein, ur: NEGATIVE mg/dL
SPECIFIC GRAVITY, URINE: 1.01 (ref 1.005–1.030)
pH: 6 (ref 5.0–8.0)

## 2017-03-05 LAB — POC URINE PREG, ED: PREG TEST UR: NEGATIVE

## 2017-03-05 MED ORDER — DIPHENHYDRAMINE HCL 50 MG/ML IJ SOLN
25.0000 mg | Freq: Once | INTRAMUSCULAR | Status: AC
  Administered 2017-03-05: 25 mg via INTRAMUSCULAR
  Filled 2017-03-05: qty 1

## 2017-03-05 MED ORDER — METOCLOPRAMIDE HCL 5 MG/ML IJ SOLN
10.0000 mg | Freq: Once | INTRAMUSCULAR | Status: AC
  Administered 2017-03-05: 10 mg via INTRAMUSCULAR
  Filled 2017-03-05: qty 2

## 2017-03-05 MED ORDER — KETOROLAC TROMETHAMINE 30 MG/ML IJ SOLN
30.0000 mg | Freq: Once | INTRAMUSCULAR | Status: AC
  Administered 2017-03-05: 30 mg via INTRAMUSCULAR
  Filled 2017-03-05: qty 1

## 2017-03-05 MED ORDER — DEXAMETHASONE SODIUM PHOSPHATE 10 MG/ML IJ SOLN
10.0000 mg | Freq: Once | INTRAMUSCULAR | Status: AC
  Administered 2017-03-05: 10 mg via INTRAMUSCULAR
  Filled 2017-03-05: qty 1
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
## 2017-03-05 NOTE — ED Notes (Signed)
Per PA, will see pt on fast track

## 2017-03-05 NOTE — Discharge Instructions (Signed)
Your labs and xrays are negative today for injury as discussed.

## 2017-03-05 NOTE — ED Provider Notes (Signed)
[facility] EMERGENCY DEPARTMENT Provider Note   CSN: [id number] Arrival date & time: 03/05/17  1522     History   Chief Complaint Chief Complaint  Patient presents with  . Fall    [name] is a 47 y.o. female with a history as outlined below and currently significant for migraine headache presenting for evaluation of injury sustained in a fall 2 days ago.  She slipped while wearing socks going down her wooden basement steps 2 days ago, landing on her left lower back and sliding down approximately 10 steps.  She denies head injury or loc but endorses pain in her left lower back and flank along with bruising and significant pain with deep inspiration.  She denies sob but is concerned about possible rib fractures.  She also has a few scattered bruises on her bilateral lower legs and her right forearm but denies pain at these sites.  She has a long standing history of migraines as well, treated with monthly Emgality injections supplemented by imitrex for breakthrough pain.  She woke with migraine today and took her imitrex with mild improvement.  She denies n/v/dizziness or focal weakness and her migraine sx are typical for her chronic headaches.  She currently has photophobia.  The history is provided by the patient and a parent.    Past Medical History:  Diagnosis Date  . Headache(784.0)    migraines  . Hypothyroidism     Patient Active Problem List   Diagnosis Date Noted  . Chronic migraine w/o aura w/o status migrainosus, not intractable 08/22/2015  . Abdominal pain, epigastric 07/03/2013  . Acute kidney injury ([place]) 10/20/2011  . Nausea & vomiting 10/19/2011  . Suprapubic abdominal pain 10/19/2011  . Dehydration 10/19/2011  . Hypothyroidism 10/19/2011  . Migraines 10/19/2011    Past Surgical History:  Procedure Laterality Date  . TUBAL LIGATION    . uterine ablation    . uterine polyps      OB History    Gravida Para Term Preterm AB Living             0     SAB TAB Ectopic Multiple Live Births                   Home Medications    Prior to Admission medications   Medication Sig Start Date End Date Taking? Authorizing Provider  ALPRAZolam Duanne Moron) 1 MG tablet Take 1 mg by mouth at bedtime. 05/01/16   [provider]  amitriptyline (ELAVIL) 50 MG tablet Take 2 tablets (100 mg total) by mouth at bedtime. 09/19/16   Melvenia Beam, MD  butalbital-acetaminophen-caffeine (FIORICET, ESGIC) 901 [id number] MG tablet TAKE 1 TABLET BY MOUTH EVERY 6 HOURS AS NEEDED FOR HEADACHE. 01/30/17   Melvenia Beam, MD  Galcanezumab-gnlm (EMGALITY) 120 MG/ML SOAJ Inject 120 mg into the skin every 30 (thirty) days. 02/19/17   Melvenia Beam, MD  levothyroxine (SYNTHROID, LEVOTHROID) 150 MCG tablet Take 150 mcg by mouth daily before breakfast.    [provider]  methylPREDNISolone (MEDROL DOSEPAK) 4 MG TBPK tablet follow package directions Patient not taking: Reported on 02/19/2017 10/09/16   Melvenia Beam, MD  Multiple Vitamin (MULTIVITAMIN WITH MINERALS) TABS tablet Take 1 tablet by mouth at bedtime.    [provider]  ondansetron (ZOFRAN ODT) 4 MG disintegrating tablet Take 1-2 tablets (4 mg total) by mouth every 8 (eight) hours as needed 08/18/15   Melvenia Beam, MD  prochlorperazine (COMPAZINE) 10  MG tablet Take 1 tablet (10 mg total) by mouth every 8 (eight) hours as needed for nausea or vomiting (Nausea). 09/19/16   Melvenia Beam, MD  SUMAtriptan (IMITREX) 100 MG tablet TAKE 1 TABLET BY MOUTH EVERY 2 HOURS AS NEEDED FOR MIGRAINE. MAX 2 DOSES PER DAY. 01/30/17   Melvenia Beam, MD  SUMAtriptan (IMITREX) 6 MG/0.5ML SOLN injection Take one dose at headache onset, can take additional dose 2hrs later if needed. No more then 2 injections in 24hrs 12/26/16   Melvenia Beam, MD  tiZANidine (ZANAFLEX) 4 MG tablet Take 1 tablet (4 mg total) by mouth every 6 (six) hours as needed for muscle spasms (spasms). 09/19/16   Melvenia Beam, MD   traMADol (ULTRAM) 50 MG tablet Take 1 tablet (50 mg total) by mouth every 6 (six) hours as needed. 10/17/16   Melvenia Beam, MD  vitamin C (ASCORBIC ACID) 500 MG tablet Take 500 mg by mouth at bedtime.    [provider]    Family History Family History  Problem Relation Age of Onset  . Migraines Mother        benign small bowel tumor  . Prostate cancer Father   . Migraines Other     Social History Social History   Tobacco Use  . Smoking status: Never Smoker  . Smokeless tobacco: Never Used  Substance Use Topics  . Alcohol use: Yes    Comment: social  . Drug use: No     Allergies   Aimovig [erenumab]   Review of Systems Review of Systems  Constitutional: Negative for fever.  Eyes: Positive for photophobia.  Respiratory: Negative for shortness of breath.   Cardiovascular: Negative for chest pain and leg swelling.  Gastrointestinal: Negative for abdominal distention, abdominal pain, constipation, nausea and vomiting.  Genitourinary: Negative for difficulty urinating, dysuria, flank pain, frequency and urgency.  Musculoskeletal: Positive for back pain. Negative for gait problem and joint swelling.  Skin: Positive for color change. Negative for rash and wound.  Neurological: Positive for headaches. Negative for weakness, light-headedness and numbness.  Hematological: Bruises/bleeds easily.     Physical Exam Updated Vital Signs BP 93/71 (BP Location: Right Arm)   Pulse 98   Temp 97.7 F (36.5 C) (Temporal)   Resp 16   Ht 5\' 7"  (1.702 m)   Wt 63 kg (139 lb)   SpO2 99%   BMI 21.77 kg/m   Physical Exam  Constitutional: She is oriented to person, place, and time. She appears well-developed and well-nourished.  Uncomfortable appearing  HENT:  Head: Normocephalic and atraumatic.  Mouth/Throat: Oropharynx is clear and moist.  Eyes: EOM are normal. Pupils are equal, round, and reactive to light.  Neck: Normal range of motion. Neck supple.   Cardiovascular: Normal rate and normal heart sounds.  Pulmonary/Chest: Effort normal. She has no decreased breath sounds. She has no wheezes. She has no rhonchi.  Musculoskeletal: Normal range of motion.       Cervical back: Normal.       Thoracic back: Normal.       Lumbar back: She exhibits bony tenderness.       Back:  Moderate contusion to left lower back and flank.  No palpable deformity.  Lymphadenopathy:    She has no cervical adenopathy.  Neurological: She is alert and oriented to person, place, and time. She has normal strength. No sensory deficit. Gait normal. GCS eye subscore is 4. GCS verbal subscore is 5. GCS motor subscore is  6.  Normal heel-shin, normal rapid alternating movements. Cranial nerves III-XII intact.  No pronator drift.  Skin: Skin is warm and dry. No rash noted.  Small bruising noted right midforearm and bilateral posterior ankles over achilles tendons.  No deformity.  Psychiatric: She has a normal mood and affect. Her speech is normal and behavior is normal. Thought content normal. Cognition and memory are normal.  Nursing note and vitals reviewed.    ED Treatments / Results  Labs (all labs ordered are listed, but only abnormal results are displayed) Labs Reviewed  URINALYSIS, DIPSTICK ONLY  POC URINE PREG, ED    EKG  EKG Interpretation None       Radiology Dg Ribs Unilateral W/chest Left  Result Date: 03/05/2017 CLINICAL DATA:  Status post fall on Saturday with left lower rib pain. EXAM: LEFT RIBS AND CHEST - 3+ VIEW COMPARISON:  July 11, 2015 FINDINGS: No fracture or other bone lesions are seen involving the ribs. There is no evidence of pneumothorax or pleural effusion. Both lungs are clear. Heart size and mediastinal contours are within normal limits. IMPRESSION: Negative. Electronically Signed   By: Abelardo Diesel M.D.   On: 03/05/2017 17:27   Dg Lumbar Spine Complete  Result Date: 03/05/2017 CLINICAL DATA:  Status post fall on Saturday with  low back pain. EXAM: LUMBAR SPINE - COMPLETE 4+ VIEW COMPARISON:  None. FINDINGS: There is no evidence of lumbar spine fracture. Alignment is normal. Intervertebral disc spaces are maintained. IMPRESSION: Negative. Electronically Signed   By: Abelardo Diesel M.D.   On: 03/05/2017 17:26    Procedures Procedures (including critical care time)  Medications Ordered in ED Medications  metoCLOPramide (REGLAN) injection 10 mg (10 mg Intramuscular Given 03/05/17 1655)  diphenhydrAMINE (BENADRYL) injection 25 mg (25 mg Intramuscular Given 03/05/17 1655)  dexamethasone (DECADRON) injection 10 mg (10 mg Intramuscular Given 03/05/17 1748)  ketorolac (TORADOL) 30 MG/ML injection 30 mg (30 mg Intramuscular Given 03/05/17 1747)     Initial Impression / Assessment and Plan / ED Course  I have reviewed the triage vital signs and the nursing notes.  Pertinent labs & imaging results that were available during my care of the patient were reviewed by me and considered in my medical decision making (see chart for details).     Imaging and labs reviewed and discussed with patient. No hematuria, doubt kidney injury from fall.  Contusions without fracture. Advised heat tx, continued home medicines. Pt takes tramadol and alprazolam chronically. No neuro deficit on exam or by history to suggest emergent neuro process. Pt endorses migraine is her chronic migraine and improved with todays tx. She reports would not have come to the ed for her current ha sx, usually are much worse before would come to the ed.  Improving with tx.     Final Clinical Impressions(s) / ED Diagnoses   Final diagnoses:  Fall, initial encounter  Contusion of lower back, initial encounter  Chronic migraine    ED Discharge Orders    None       Landis Martins 03/05/17 1810    Orlie Dakin, MD 03/05/17 2012

## 2017-03-06 ENCOUNTER — Telehealth: Payer: Self-pay | Admitting: *Deleted

## 2017-03-06 NOTE — Telephone Encounter (Signed)
Received notification from [facility] that pt was unable to be reached to schedule shipment of Ajovy. Spoke with Helaina and informed her that the patient is no longer taking Ajovy. She verbalized understanding and will cancel the order.

## 2017-03-19 ENCOUNTER — Other Ambulatory Visit: Payer: Self-pay

## 2017-03-19 ENCOUNTER — Encounter (HOSPITAL_COMMUNITY): Payer: Self-pay | Admitting: Emergency Medicine

## 2017-03-19 ENCOUNTER — Emergency Department (HOSPITAL_COMMUNITY)
Admission: EM | Admit: 2017-03-19 | Discharge: 2017-03-19 | Disposition: A | Discharge status: Home / Self Care | Payer: BLUE CROSS/BLUE SHIELD | Attending: Emergency Medicine | Admitting: Emergency Medicine

## 2017-03-19 DIAGNOSIS — R51 Headache: Secondary | ICD-10-CM | POA: Insufficient documentation

## 2017-03-19 DIAGNOSIS — G43909 Migraine, unspecified, not intractable, without status migrainosus: Secondary | ICD-10-CM | POA: Diagnosis not present

## 2017-03-19 DIAGNOSIS — R42 Dizziness and giddiness: Secondary | ICD-10-CM | POA: Diagnosis not present

## 2017-03-19 DIAGNOSIS — E039 Hypothyroidism, unspecified: Secondary | ICD-10-CM | POA: Diagnosis not present

## 2017-03-19 DIAGNOSIS — Z5321 Procedure and treatment not carried out due to patient leaving prior to being seen by health care provider: Secondary | ICD-10-CM | POA: Insufficient documentation

## 2017-03-19 DIAGNOSIS — G4489 Other headache syndrome: Secondary | ICD-10-CM | POA: Insufficient documentation

## 2017-03-19 DIAGNOSIS — R531 Weakness: Secondary | ICD-10-CM | POA: Diagnosis not present

## 2017-03-19 DIAGNOSIS — Z79899 Other long term (current) drug therapy: Secondary | ICD-10-CM | POA: Diagnosis not present

## 2017-03-19 DIAGNOSIS — R110 Nausea: Secondary | ICD-10-CM | POA: Diagnosis not present

## 2017-03-19 HISTORY — DX: Migraine, unspecified, not intractable, without status migrainosus: G43.909

## 2017-03-19 MED ORDER — SODIUM CHLORIDE 0.9 % IV BOLUS (SEPSIS)
1000.0000 mL | Freq: Once | INTRAVENOUS | Status: AC
  Administered 2017-03-19: 1000 mL via INTRAVENOUS

## 2017-03-19 MED ORDER — DEXAMETHASONE SODIUM PHOSPHATE 10 MG/ML IJ SOLN
10.0000 mg | Freq: Once | INTRAMUSCULAR | Status: AC
  Administered 2017-03-19: 10 mg via INTRAVENOUS
  Filled 2017-03-19: qty 1

## 2017-03-19 MED ORDER — KETOROLAC TROMETHAMINE 30 MG/ML IJ SOLN
30.0000 mg | Freq: Once | INTRAMUSCULAR | Status: AC
  Administered 2017-03-19: 30 mg via INTRAVENOUS
  Filled 2017-03-19: qty 1

## 2017-03-19 MED ORDER — PROCHLORPERAZINE EDISYLATE 5 MG/ML IJ SOLN
10.0000 mg | Freq: Once | INTRAMUSCULAR | Status: AC
  Administered 2017-03-19: 10 mg via INTRAVENOUS
  Filled 2017-03-19: qty 2

## 2017-03-19 MED ORDER — DIPHENHYDRAMINE HCL 50 MG/ML IJ SOLN
25.0000 mg | Freq: Once | INTRAMUSCULAR | Status: AC
  Administered 2017-03-19: 25 mg via INTRAVENOUS
  Filled 2017-03-19: qty 1

## 2017-03-19 NOTE — ED Notes (Signed)
PT told registration she was feeling better and left with her family at this time.

## 2017-03-19 NOTE — ED Provider Notes (Signed)
Patient's blood pressure is improved.  Given Benadryl and Compazine.  States headache is resolved.  Continues to have a normal neurologic exam.  Return precautions given.   Julianne Rice, MD 03/19/17 1014

## 2017-03-19 NOTE — ED Triage Notes (Signed)
Pt with migraine since last Thursday. Has had 6 shots of Imitrex since then without relief.

## 2017-03-19 NOTE — ED Notes (Signed)
Ambulating to BR without difficulty.

## 2017-03-19 NOTE — ED Notes (Signed)
Ambulated to BR without difficulty

## 2017-03-19 NOTE — ED Provider Notes (Signed)
[facility] EMERGENCY DEPARTMENT Provider Note   CSN: [id number] Arrival date & time: 03/19/17  9678     History   Chief Complaint Chief Complaint  Patient presents with  . Migraine    HPI PEARLY BARTOSIK is a 47 y.o. female.  The history is provided by the patient.  Migraine  This is a recurrent problem. The current episode started more than 2 days ago. The problem occurs daily. The problem has been gradually worsening. Associated symptoms include headaches. Pertinent negatives include no chest pain and no abdominal pain. Nothing aggravates the symptoms. Nothing relieves the symptoms.  Patient with history of migraines presents for headache.  She reports this started about 5 days ago.  Gradual onset.  Similar to prior migraines.  No fever/vomiting.  No syncope.  No seizures.  No focal weakness.  No visual changes.  No history of stroke.  She does report feeling dizzy, but likely due to her medications. She has used home medications without improvement Past Medical History:  Diagnosis Date  . Headache(784.0)    migraines  . Hypothyroidism   . Migraines     Patient Active Problem List   Diagnosis Date Noted  . Chronic migraine w/o aura w/o status migrainosus, not intractable 08/22/2015  . Abdominal pain, epigastric 07/03/2013  . Acute kidney injury ([place]) 10/20/2011  . Nausea & vomiting 10/19/2011  . Suprapubic abdominal pain 10/19/2011  . Dehydration 10/19/2011  . Hypothyroidism 10/19/2011  . Migraines 10/19/2011    Past Surgical History:  Procedure Laterality Date  . TUBAL LIGATION    . uterine ablation    . uterine polyps      OB History    Gravida Para Term Preterm AB Living             0   SAB TAB Ectopic Multiple Live Births                   Home Medications    Prior to Admission medications   Medication Sig Start Date End Date Taking? Authorizing Provider  ALPRAZolam Duanne Moron) 1 MG tablet Take 1 mg by mouth at bedtime. 05/01/16  Yes [provider]  amitriptyline (ELAVIL) 50 MG tablet Take 2 tablets (100 mg total) by mouth at bedtime. 09/19/16  Yes Melvenia Beam, MD  butalbital-acetaminophen-caffeine (FIORICET, ESGIC) 50-325-40 MG tablet TAKE 1 TABLET BY MOUTH EVERY 6 HOURS AS NEEDED FOR HEADACHE. 01/30/17  Yes Melvenia Beam, MD  Galcanezumab-gnlm (EMGALITY) 120 MG/ML SOAJ Inject 120 mg into the skin every 30 (thirty) days. 02/19/17  Yes Melvenia Beam, MD  levothyroxine (SYNTHROID, LEVOTHROID) 150 MCG tablet Take 150 mcg by mouth daily before breakfast.   Yes [provider]  methylPREDNISolone (MEDROL DOSEPAK) 4 MG TBPK tablet follow package directions 10/09/16  Yes Melvenia Beam, MD  Multiple Vitamin (MULTIVITAMIN WITH MINERALS) TABS tablet Take 1 tablet by mouth at bedtime.   Yes [provider]  ondansetron (ZOFRAN ODT) 4 MG disintegrating tablet Take 1-2 tablets (4 mg total) by mouth every 8 (eight) hours as needed 08/18/15  Yes Melvenia Beam, MD  prochlorperazine (COMPAZINE) 10 MG tablet Take 1 tablet (10 mg total) by mouth every 8 (eight) hours as needed for nausea or vomiting (Nausea). 09/19/16  Yes Melvenia Beam, MD  SUMAtriptan (IMITREX) 100 MG tablet TAKE 1 TABLET BY MOUTH EVERY 2 HOURS AS NEEDED FOR MIGRAINE. MAX 2 DOSES PER DAY. 01/30/17  Yes Melvenia Beam, MD  SUMAtriptan [facility]  6 MG/0.5ML SOLN injection Take one dose at headache onset, can take additional dose 2hrs later if needed. No more then 2 injections in 24hrs 12/26/16  Yes Melvenia Beam, MD  tiZANidine (ZANAFLEX) 4 MG tablet Take 1 tablet (4 mg total) by mouth every 6 (six) hours as needed for muscle spasms (spasms). 09/19/16  Yes Melvenia Beam, MD  traMADol (ULTRAM) 50 MG tablet Take 1 tablet (50 mg total) by mouth every 6 (six) hours as needed. 10/17/16  Yes Melvenia Beam, MD  vitamin C (ASCORBIC ACID) 500 MG tablet Take 500 mg by mouth at bedtime.   Yes [provider]    Family History Family History    Problem Relation Age of Onset  . Migraines Mother        benign small bowel tumor  . Prostate cancer Father   . Migraines Other     Social History Social History   Tobacco Use  . Smoking status: Never Smoker  . Smokeless tobacco: Never Used  Substance Use Topics  . Alcohol use: Yes    Comment: social  . Drug use: No     Allergies   Aimovig [erenumab]   Review of Systems Review of Systems  Constitutional: Negative for fever.  Cardiovascular: Negative for chest pain.  Gastrointestinal: Positive for nausea. Negative for abdominal pain and vomiting.  Neurological: Positive for dizziness and headaches. Negative for seizures, syncope and weakness.  All other systems reviewed and are negative.    Physical Exam Updated Vital Signs BP (!) 72/54 (BP Location: Left Arm)   Pulse 66   Resp 18   Ht 1.702 m (5\' 7" )   Wt 63 kg (139 lb)   SpO2 98%   BMI 21.77 kg/m   Physical Exam CONSTITUTIONAL: Well developed/well nourished, patient appears sedated HEAD: Normocephalic/atraumatic EYES: EOMI/PERRL, no nystagmus, no ptosis ENMT: Mucous membranes moist NECK: supple no meningeal signs, no bruits SPINE/BACK:entire spine nontender CV: S1/S2 noted, no murmurs/rubs/gallops noted LUNGS: Lungs are clear to auscultation bilaterally, no apparent distress ABDOMEN: soft, nontender, no rebound or guarding GU:no cva tenderness NEURO:Awake/alert, face symmetric, no arm or leg drift is noted Equal 5/5 strength with shoulder abduction, elbow flex/extension, wrist flex/extension in upper extremities and equal hand grips bilaterally Equal 5/5 strength with hip flexion,knee flex/extension, foot dorsi/plantar flexion Cranial nerves 3/4/5/6/07/17/08/11/12 tested and intact No past pointing Sensation to light touch intact in all extremities EXTREMITIES: pulses normal, full ROM SKIN: warm, color normal PSYCH: no abnormalities of mood noted, alert and oriented to situation    ED Treatments /  Results  Labs (all labs ordered are listed, but only abnormal results are displayed) Labs Reviewed - No data to display  EKG  EKG Interpretation None       Radiology No results found.  Procedures Procedures   Medications Ordered in ED Medications  sodium chloride 0.9 % bolus 1,000 mL (not administered)  ketorolac (TORADOL) 30 MG/ML injection 30 mg (not administered)  dexamethasone (DECADRON) injection 10 mg (not administered)     Initial Impression / Assessment and Plan / ED Course  I have reviewed the triage vital signs and the nursing notes.     6:53 AM Patient presents for migraine.  She reports is similar to prior. No neuro deficits noted.  She is hypotensive, but she reports this is likely due to the Zanaflex that she takes We will start with IV fluids, Toradol, and she reports good response to Decadron.  If BP improves will also order  Compazine and Benadryl 7:17 AM Plan at signout to dr Lita Mains, re-check blood pressure after IV fluids, if improved she can be given compazine/benadryl and discharged.    Final Clinical Impressions(s) / ED Diagnoses   Final diagnoses:  Other headache syndrome    ED Discharge Orders    None       Ripley Fraise, MD 03/19/17 [phone]

## 2017-03-19 NOTE — ED Triage Notes (Signed)
PT states she was seen in the ED this morning for c/o of headache and it resolved. PT states she got home and the headache came back and now c/o increased anxiety.

## 2017-04-04 ENCOUNTER — Ambulatory Visit: Payer: BLUE CROSS/BLUE SHIELD | Admitting: Neurology

## 2017-04-04 ENCOUNTER — Telehealth: Payer: Self-pay | Admitting: Neurology

## 2017-04-04 ENCOUNTER — Encounter: Payer: Self-pay | Admitting: Neurology

## 2017-04-04 DIAGNOSIS — M5481 Occipital neuralgia: Secondary | ICD-10-CM

## 2017-04-04 DIAGNOSIS — G43711 Chronic migraine without aura, intractable, with status migrainosus: Secondary | ICD-10-CM

## 2017-04-04 DIAGNOSIS — M542 Cervicalgia: Secondary | ICD-10-CM | POA: Diagnosis not present

## 2017-04-04 DIAGNOSIS — G43709 Chronic migraine without aura, not intractable, without status migrainosus: Secondary | ICD-10-CM | POA: Diagnosis not present

## 2017-04-04 NOTE — Progress Notes (Signed)
[facility]    Provider:  Dr Jaynee Eagles Referring Provider: Sinda Du, MD Primary Care Physician:  Sinda Du, MD  CC: Migraines  Interval history 04/05/2017: 47 year old with chronic intractable headaches. Here for severe migraine. She has been to multiple headache specialists. She has failed most medications, botox therapy as well as all 3 new CGRP medications.  There is no medication overuse.  Previous to me she was seeing Dr. Claudie Fisherman over at the [facility] headache clinic.  By all accounts she appears compliant with her medication regimen, she appears sincere, we discussed lifestyle modifications, there is no medication overuse per patient, she has also been to physical therapy.  Chronic intractable headaches failed multiple classes.  She wants a referral to [facility] will do that.  We will also send her to Dr. Daisey Must neurosurgery for possible radiofrequency ablation.   Past medications and past medications include: Excedrin and Ultram, D.H.E. capsule, DHE injection RV, Relpax and Zomig tablets, Imitrex(oral,nasal,injectable). Zonegran. Reglan and Zofran. Flexeril. Prednisone. Effexor, Pristiq, Wellbutrin, Cymbalta. Procedures Botox. Atenolol. On Cymbalta, Neurontin, lisinopril, Zanaflex, Topamax 300 mg by mouth daily. Took amitriptyline, nortriptyline. Relpax.  Candesartan, Qudexy, zofran, compazine, SSRIs,  Botox, Ajovy, Aimovig, erenumab and other medications not listed here   Interval history 09/19/2016:  Patient returns today with her mother. Patient started the new CGRP medications in the same day she developed a bilateral occipital burning headache that has not relented in over a week. Not sure what mechanism would cause this, maybe it is coincidental. The pain is severe, it is keeping her up at night, she is here with her mother who also provides much information saying the headaches and severe, burning however she has no migrainous symptoms, no later  sound sensitivity, she does have nausea when the pain is severe, nothing like her usual migraines.   Interval history 11/18/2015: She is on the cymbalta, amitriptyline, mood is better and she is sleeping well. She is still on the topiramate. She has had 12 migraines this month and at least 20 headache days. She has had a bad headche since starting the Aimovig on the 31st. She has a burning sensation on the back of the left side of the head. It is constantly burning, she has a lot of nausea and vomiting, She vomits everything. She had left-sided tingling in the face. Even with the botox, 15 a month. Dr Corinna Capra is her obgyn. Will discuss birth control with ObGyn, try Nuvaring.   Martha Aguilar a 47 y.o.femalehere as a referral from Dr. Mertie Moores migraines. Chronic since 1990. Here for second opinion. She has been going to see Dr. Sima Matas in [place] at the [facility]. Worsening in the setting of a divorce. Started at the age of 71. Mother had migraines. She has been seeing Dr. Sima Matas for many years. She has pounding and pressure in the middle of the head, sometimes more on the right. She gets migraines 2 a week at least and they can last several days. She takes an imitrex shot for acute management. Triggers are wine, weather, exhaustion. Her sleeping is better now. She has been on [place] for 10 years. She takes advil every day. Migraineshave lasted 3 days. She has light sensitivity, sound sensitivity, has to lay still in a dark room, no nausea or vomiting. No aura. They come on very quickly. She is only on synthroid and ambien. She feels a lot better since discontinuing all her medications. She has tried multiple medications. She has been  getting botox for migraine for at least 5 years which has tremendously helped she used to have daily migraines. She has at least 15 days a month of headache of which at least 8 are migrainous. No medication overuse. She gets good benefit from botox.  Migraines can be severe. No aura.   Reviewed notes, labs and imaging from outside physicians, which showed:   Patient is here for second opinion. She is to Dr. Claudie Fisherman in [place]. As of last note, She has 8 severe headaches in a month, and severity is severe. Location is frontal. The headaches last for 12 hours. Pain is described as pressure-like and tight. Symptoms have been associated with sensitivity to light, sensitivity to noise and worse with movement activities. Denies nausea, vomiting, odor sensitivity or dizziness. Headache triggers include alcohol, fatigue, sleep deprivation, whether, periods of sinus problems. Since Botox patient reduced her headaches from almost daily to recently 16 headache free days and a 20 headache free days last visit with Dr. Sima Matas. She's been getting Botox for several years. She is under stress with recent separation. Her BP was elevated so she was prescribed lisinopril and hydrochlorothiazide. Imitrex has not been as effective lately and she would be interested in trying Maxalt. Vitamins especially CoQ10 seem to help. She's had piercings and she tried taking neuro device. She's been told to consider hysterectomy to decrease the headaches.  Past medications and past medications include: Excedrin and Ultram, D.H.E. capsule, DHE injection RV, Relpax and Zomig tablets, Imitrex. Zonegran. Reglan and Zofran. Flexeril. Prednisone. Effexor, Pristiq, Wellbutrin, Cymbalta. Procedures Botox. Atenolol. On Cymbalta, Neurontin, lisinopril, Zanaflex, Topamax 300 mg by mouth daily. Took amitriptyline, nortriptyline. Relpax.  Candesartan, Botox, Ajovy, Aimovig, erenumab and other medications not listed here  LDL 99, BUN 18, creatinine 0.8, TSH 0.6. BUN and creatinine were drawn in July 2017.  Review of Systems: Patient complains of symptoms per HPI as well as the following symptoms: no cp, no sob, no hx of cardiac problems. Pertinent negatives per HPI. All others  negative.   Social History   Socioeconomic History  . Marital status: Legally Separated    Spouse name: Not on file  . Number of children: 1  . Years of education: 39  . Highest education level: Not on file  Occupational History  . Occupation: Nurse  Social Needs  . Financial resource strain: Not on file  . Food insecurity:    Worry: Not on file    Inability: Not on file  . Transportation needs:    Medical: Not on file    Non-medical: Not on file  Tobacco Use  . Smoking status: Never Smoker  . Smokeless tobacco: Never Used  Substance and Sexual Activity  . Alcohol use: Yes    Comment: social  . Drug use: No  . Sexual activity: Not on file  Lifestyle  . Physical activity:    Days per week: Not on file    Minutes per session: Not on file  . Stress: Not on file  Relationships  . Social connections:    Talks on phone: Not on file    Gets together: Not on file    Attends religious service: Not on file    Active member of club or organization: Not on file    Attends meetings of clubs or organizations: Not on file    Relationship status: Not on file  . Intimate partner violence:    Fear of current or ex partner: Not on file  Emotionally abused: Not on file    Physically abused: Not on file    Forced sexual activity: Not on file  Other Topics Concern  . Not on file  Social History Narrative   Lives with daughter   Caffeine use: rare    Family History  Problem Relation Age of Onset  . Migraines Mother        benign small bowel tumor  . Prostate cancer Father   . Migraines Other     Past Medical History:  Diagnosis Date  . Headache(784.0)    migraines  . Hypothyroidism   . Migraines     Past Surgical History:  Procedure Laterality Date  . TUBAL LIGATION    . uterine ablation    . uterine polyps      Current Outpatient Medications  Medication Sig Dispense Refill  . ALPRAZolam (XANAX) 1 MG tablet Take 1 mg by mouth at bedtime.  5  . amitriptyline  (ELAVIL) 50 MG tablet Take 2 tablets (100 mg total) by mouth at bedtime. 30 tablet 11  . butalbital-acetaminophen-caffeine (FIORICET, ESGIC) 50-325-40 MG tablet TAKE 1 TABLET BY MOUTH EVERY 6 HOURS AS NEEDED FOR HEADACHE. 10 tablet 0  . levothyroxine (SYNTHROID, LEVOTHROID) 150 MCG tablet Take 150 mcg by mouth daily before breakfast.    . Multiple Vitamin (MULTIVITAMIN WITH MINERALS) TABS tablet Take 1 tablet by mouth at bedtime.    . ondansetron (ZOFRAN ODT) 4 MG disintegrating tablet Take 1-2 tablets (4 mg total) by mouth every 8 (eight) hours as needed 30 tablet 12  . prochlorperazine (COMPAZINE) 10 MG tablet Take 1 tablet (10 mg total) by mouth every 8 (eight) hours as needed for nausea or vomiting (Nausea). 45 tablet 6  . SUMAtriptan (IMITREX) 100 MG tablet TAKE 1 TABLET BY MOUTH EVERY 2 HOURS AS NEEDED FOR MIGRAINE. MAX 2 DOSES PER DAY. 10 tablet 0  . SUMAtriptan (IMITREX) 6 MG/0.5ML SOLN injection Take one dose at headache onset, can take additional dose 2hrs later if needed. No more then 2 injections in 24hrs 10 vial 6  . tiZANidine (ZANAFLEX) 4 MG tablet Take 1 tablet (4 mg total) by mouth every 6 (six) hours as needed for muscle spasms (spasms). 90 tablet 5  . traMADol (ULTRAM) 50 MG tablet Take 1 tablet (50 mg total) by mouth every 6 (six) hours as needed. 30 tablet 5  . vitamin C (ASCORBIC ACID) 500 MG tablet Take 500 mg by mouth at bedtime.    . methylPREDNISolone (MEDROL DOSEPAK) 4 MG TBPK tablet follow package directions (Patient not taking: Reported on 04/04/2017) 21 tablet 2   No current facility-administered medications for this visit.     Allergies as of 04/04/2017 - Review Complete 04/04/2017  Allergen Reaction Noted  . Aimovig [erenumab] Other (See Comments) 10/06/2016  . Ajovy [fremanezumab-vfrm]  04/04/2017  . Emgality [galcanezumab-gnlm]  04/04/2017    Vitals: There were no vitals taken for this visit. Last Weight:  Wt Readings from Last 1 Encounters:  03/19/17 139  lb (63 kg)   Last Height:   Ht Readings from Last 1 Encounters:  03/19/17 5\' 7"  (1.702 m)     Physical exam: Exam: Gen: NAD, conversant, well nourised, obese, well groomed                     CV: RRR, no MRG. No Carotid Bruits. No peripheral edema, warm, nontender Eyes: Conjunctivae clear without exudates or hemorrhage MSK: Pain on palpation midline and bilateral occipital areas.  Neuro: Detailed Neurologic Exam  Speech:    Speech is normal; fluent and spontaneous with normal comprehension.  Cognition:    The patient is oriented to person, place, and time;     recent and remote memory intact;     language fluent;     normal attention, concentration,     fund of knowledge Cranial Nerves:    The pupils are equal, round, and reactive to light. The fundi are normal and spontaneous venous pulsations are present. Visual fields are full to finger confrontation. Extraocular movements are intact. Trigeminal sensation is intact and the muscles of mastication are normal. The face is symmetric. The palate elevates in the midline. Hearing intact. Voice is normal. Shoulder shrug is normal. The tongue has normal motion without fasciculations.   Coordination:    Normal finger to nose and heel to shin. Normal rapid alternating movements.   Gait:    Heel-toe and tandem gait are normal.   Motor Observation:    No asymmetry, no atrophy, and no involuntary movements noted. Tone:    Normal muscle tone.    Posture:    Posture is normal. normal erect    Strength:    Strength is V/V in the upper and lower limbs.      Sensation: intact to LT     Reflex Exam:  DTR's:    Deep tendon reflexes in the upper and lower extremities are normal bilaterally.   Toes:    The toes are downgoing bilaterally.   Clonus:    Clonus is absent.  Assessment/Plan:47 year old with chronic intractable headaches. Here for severe migraine. She has been to multiple headache specialists. She has failed most  medications, botox therapy as well as all 3 new CGRP medications.   Will refer to pain management Dr. Maryjean Ka for evaluation of radiofrequency ablations for her occipital head pain Will give her a migraine infusion today (depacon, toradol, phenergan, fluids, steroids) She would like a referral to [facility], will refer  [facility] referral  Past medications and past medications include: Excedrin and Ultram, D.H.E. capsule, DHE injection RV, Relpax and Zomig tablets, Imitrex(oral,nasal,injectable). Zonegran. Reglan and Zofran. Flexeril. Prednisone. Effexor, Pristiq, Wellbutrin, Cymbalta. Procedures Botox. Atenolol. On Cymbalta, Neurontin, lisinopril, Zanaflex, Topamax 300 mg by mouth daily. Took amitriptyline, nortriptyline. Relpax.  Candesartan, Qudexy, zofran, compazine, SSRIs,  Botox, Ajovy, Aimovig, erenumab and other medications not listed here  Discussed: To prevent or relieve headaches, try the following: Cool Compress. Lie down and place a cool compress on your head.  Avoid headache triggers. If certain foods or odors seem to have triggered your migraines in the past, avoid them. A headache diary might help you identify triggers.  Include physical activity in your daily routine. Try a daily walk or other moderate aerobic exercise.  Manage stress. Find healthy ways to cope with the stressors, such as delegating tasks on your to-do list.  Practice relaxation techniques. Try deep breathing, yoga, massage and visualization.  Eat regularly. Eating regularly scheduled meals and maintaining a healthy diet might help prevent headaches. Also, drink plenty of fluids.  Follow a regular sleep schedule. Sleep deprivation might contribute to headaches Consider biofeedback. With this mind-body technique, you learn to control certain bodily functions - such as muscle tension, heart rate and blood pressure - to prevent headaches or reduce headache pain.    Proceed to emergency room if  you experience new or worsening symptoms or symptoms do not resolve, if you have new neurologic symptoms  or if headache is severe, or for any concerning symptom.   Provided education and documentation from American headache Society toolbox including articles on: chronic migraine medication overuse headache, chronic migraines, prevention of migraines, behavioral and other nonpharmacologic treatments for headache.   Martha Ill, MD  [facility] [address] [place], [place] [address]  Phone [phone] Fax [phone]  A total of 25 minutes was spent face-to-face with this patient. Over half this time was spent on counseling patient on the acute migraine, intractanble diagnosis and different diagnostic and therapeutic options available.

## 2017-04-04 NOTE — Telephone Encounter (Signed)
Dr. Jaynee Eagles aware, pt can be seen for nerve blocks, appt available today.   Spoke with patient. She stated she has had a migraine since Friday. She stated this is a typical migraine and she is hurting in the back of her head. Scheduled pt for nerve blocks today @ 2:00 pm. Pt aware of early arrival time. She verbalized appreciation.

## 2017-04-04 NOTE — Telephone Encounter (Signed)
Pt is asking for a call as to when she can come in for a Nerve Block.  Please call

## 2017-04-09 ENCOUNTER — Other Ambulatory Visit: Payer: Self-pay | Admitting: Neurology

## 2017-04-10 NOTE — Telephone Encounter (Signed)
Faxed signed Fioricet prescription to [facility]. Received a receipt of confirmation.  

## 2017-04-11 ENCOUNTER — Ambulatory Visit (INDEPENDENT_AMBULATORY_CARE_PROVIDER_SITE_OTHER): Payer: BLUE CROSS/BLUE SHIELD | Admitting: Neurology

## 2017-04-11 ENCOUNTER — Telehealth: Payer: Self-pay | Admitting: Neurology

## 2017-04-11 ENCOUNTER — Encounter: Payer: Self-pay | Admitting: Neurology

## 2017-04-11 DIAGNOSIS — I639 Cerebral infarction, unspecified: Secondary | ICD-10-CM | POA: Diagnosis not present

## 2017-04-11 DIAGNOSIS — R51 Headache with orthostatic component, not elsewhere classified: Secondary | ICD-10-CM

## 2017-04-11 DIAGNOSIS — G43611 Persistent migraine aura with cerebral infarction, intractable, with status migrainosus: Secondary | ICD-10-CM

## 2017-04-11 DIAGNOSIS — H539 Unspecified visual disturbance: Secondary | ICD-10-CM

## 2017-04-11 DIAGNOSIS — R519 Headache, unspecified: Secondary | ICD-10-CM

## 2017-04-11 DIAGNOSIS — G8929 Other chronic pain: Secondary | ICD-10-CM

## 2017-04-11 DIAGNOSIS — R29898 Other symptoms and signs involving the musculoskeletal system: Secondary | ICD-10-CM

## 2017-04-11 DIAGNOSIS — G4453 Primary thunderclap headache: Secondary | ICD-10-CM

## 2017-04-11 DIAGNOSIS — G43011 Migraine without aura, intractable, with status migrainosus: Secondary | ICD-10-CM

## 2017-04-11 DIAGNOSIS — R339 Retention of urine, unspecified: Secondary | ICD-10-CM

## 2017-04-11 DIAGNOSIS — M542 Cervicalgia: Secondary | ICD-10-CM

## 2017-04-11 MED ORDER — MEMANTINE HCL 10 MG PO TABS: 10.0000 mg | ORAL_TABLET | Freq: Two times a day (BID) | ORAL | 6 refills | Status: DC

## 2017-04-11 MED ORDER — METHYLPREDNISOLONE ACETATE 80 MG/ML IJ SUSP
80.0000 mg | Freq: Once | INTRAMUSCULAR | Status: AC
  Administered 2017-04-11: 80 mg via INTRAMUSCULAR

## 2017-04-11 NOTE — Progress Notes (Signed)
Nerve block w/ steroid: Consent signed by patient  4 syringes (3 mL each) of 1:1 Bupivocaine & Lidocaine 1 syringe (3 mL) of 1:1:1 Depomedrol, Bupivocaine, & Lidocaine  Methylprednisolone 80 mg injection 1 mL See MAR  0.5% Bupivocaine 7 mL total LOT: [id number] EXP: 05/10/2018 NDC: 2449-7530-05  2% Lidocaine 7 mL total LOT: [id number] EXP: 09/10/2018 NDC: 1102-1117-35 //BCrn

## 2017-04-11 NOTE — Telephone Encounter (Signed)
Spoke with Dr. Jaynee Eagles. She is able to do nerve blocks only today at 3:30.   Called pt and scheduled her for 3:30 appt today for nerve blocks only. She verbalized appreciation and will arrive between 3 and 3:15. Since pt was just seen in office last week and then again today, RN and pt discussed canceling the f/u that was previously scheduled for 4/9. Appt canceled.

## 2017-04-11 NOTE — Telephone Encounter (Signed)
Pt called stating she has had a mirgrinae since 3/28. Pt would like to come in for a nerve block if possible please

## 2017-04-11 NOTE — Patient Instructions (Signed)
Memantine Tablets What is this medicine? MEMANTINE (MEM an teen) is used to treat dementia caused by Alzheimer's disease. Also a new medication for migraines. This medicine may be used for other purposes; ask your health care provider or pharmacist if you have questions. COMMON BRAND NAME(S): Namenda What should I tell my health care provider before I take this medicine? They need to know if you have any of these conditions: -difficulty passing urine -kidney disease -liver disease -seizures -an unusual or allergic reaction to memantine, other medicines, foods, dyes, or preservatives -pregnant or trying to get pregnant -breast-feeding How should I use this medicine? Take this medicine by mouth with a glass of water. Follow the directions on the prescription label. You may take this medicine with or without food. Take your doses at regular intervals. Do not take your medicine more often than directed. Continue to take your medicine even if you feel better. Do not stop taking except on the advice of your doctor or health care professional. Talk to your pediatrician regarding the use of this medicine in children. Special care may be needed. Overdosage: If you think you have taken too much of this medicine contact a poison control center or emergency room at once. NOTE: This medicine is only for you. Do not share this medicine with others. What if I miss a dose? If you miss a dose, take it as soon as you can. If it is almost time for your next dose, take only that dose. Do not take double or extra doses. If you do not take your medicine for several days, contact your health care provider. Your dose may need to be changed. What may interact with this medicine? -acetazolamide -amantadine -cimetidine -dextromethorphan -dofetilide -hydrochlorothiazide -ketamine -metformin -methazolamide -quinidine -ranitidine -sodium bicarbonate -triamterene This list may not describe all possible  interactions. Give your health care provider a list of all the medicines, herbs, non-prescription drugs, or dietary supplements you use. Also tell them if you smoke, drink alcohol, or use illegal drugs. Some items may interact with your medicine. What should I watch for while using this medicine? Visit your doctor or health care professional for regular checks on your progress. Check with your doctor or health care professional if there is no improvement in your symptoms or if they get worse. You may get drowsy or dizzy. Do not drive, use machinery, or do anything that needs mental alertness until you know how this drug affects you. Do not stand or sit up quickly, especially if you are an older patient. This reduces the risk of dizzy or fainting spells. Alcohol can make you more drowsy and dizzy. Avoid alcoholic drinks. What side effects may I notice from receiving this medicine? Side effects that you should report to your doctor or health care professional as soon as possible: -allergic reactions like skin rash, itching or hives, swelling of the face, lips, or tongue -agitation or a feeling of restlessness -depressed mood -dizziness -hallucinations -redness, blistering, peeling or loosening of the skin, including inside the mouth -seizures -vomiting Side effects that usually do not require medical attention (report to your doctor or health care professional if they continue or are bothersome): -constipation -diarrhea -headache -nausea -trouble sleeping This list may not describe all possible side effects. Call your doctor for medical advice about side effects. You may report side effects to FDA at 1-800-FDA-1088. Where should I keep my medicine? Keep out of the reach of children. Store at room temperature between 15 degrees and 30 degrees  C (59 degrees and 86 degrees F). Throw away any unused medicine after the expiration date. NOTE: This sheet is a summary. It may not cover all possible  information. If you have questions about this medicine, talk to your doctor, pharmacist, or health care provider.  2018 Elsevier/Gold Standard (2012-10-14 14:10:42)

## 2017-04-11 NOTE — Progress Notes (Addendum)
MRI brain w/wo contrast and MRI cervical spine: Due to positional headache she is also reporting vision changes, headaches are worse supine, thunderclap and intractable despite multiple injections and migraine cocktails and trigger point injections,.Need  MRI of the Aaron Edelman need to evaluate for space-occupying mass, intracranial hypertension, subarachnoid hemorrhage, also concerning is new urine retention need mri cervical spine for spinal cord stenosis, cervical radiculopathy, or other etiology for her symptoms for surgical or other intervention.  Performed by Dr. Jaynee Eagles M.D. All procedures a documented blood were medically necessary, reasonable and appropriate based on the patient's history, medical diagnosis and physician opinion. Verbal informed consent was obtained from the patient, patient was informed of potential risk of procedure, including bruising, bleeding, hematoma formation, infection, muscle weakness, muscle pain, numbness, transient hypertension, transient hyperglycemia and transient insomnia among others. All areas injected were topically clean with isopropyl rubbing alcohol. Nonsterile nonlatex gloves were worn during the procedure.  1. Greater occipital nerve block [phone]). The greater occipital nerve site was identified at the nuchal line medial to the occipital artery. Medication was injected into the left and right occipital nerve areas and suboccipital areas. Patient's condition is associated with inflammation of the greater occipital nerve and associated multiple groups. Injection was deemed medically necessary, reasonable and appropriate. Injection represents a separate and unique surgical service.  2. Lesser occipital nerve block [phone]). The lesser occipital nerve site was identified approximately 2 cm lateral to the greater occipital nerve. Occasion was injected into the left and right occipital nerve areas. Patient's condition is associated with inflammation of the lesser occipital  nerve and associated muscle groups. Injection was deemed medically necessary, reasonable and appropriate. Injection represents a separate and unique surgical service.   3. Auriculotemporal nerve block (31517): The Auriculotemporal nerve site was identified along the posterior margin of the sternocleidomastoid muscle toward the base of the ear. Medication was injected into the left and right radicular temporal nerve areas. Patient's condition is associated with inflammation of the Auriculotemporal Nerve and associated muscle groups. Injection was deemed medically necessary, reasonable and appropriate. Injection represents a separate and unique surgical service.  4. Supraorbital nerve block ([id number]): Supraorbital nerve site was identified along the incision of the frontal bone on the orbital/supraorbital ridge. Medication was injected into the left and right supraorbital nerve areas. Patient's condition is associated with inflammation of the supraorbital and associated muscle groups. Injection was deemed medically necessary, reasonable and appropriate. Injection represents a separate and unique surgical service.

## 2017-04-16 DIAGNOSIS — G43709 Chronic migraine without aura, not intractable, without status migrainosus: Secondary | ICD-10-CM | POA: Diagnosis not present

## 2017-04-16 NOTE — Telephone Encounter (Signed)
Per v.o. From Dr. Jaynee Eagles:  Depacon 1 gram IV x 1 Solumedrol 250 mg IV x 1 Compazine 10 mg IV x 1 Toradol 30 mg IV x 1  Insurance card copy printed & included with order form. Mindy RN with infusion aware pt coming at 3:30 pm.

## 2017-04-16 NOTE — Telephone Encounter (Signed)
Pt called she is still experiencing a migraine and is wanting to come in today for migraine cocktail. She can be reached at [phone]

## 2017-04-16 NOTE — Telephone Encounter (Signed)
Spoke with patient. She stated that the nerve block helped initially but the migraine came back the next morning and has not gone away. It has been hurting in the front and the back. She doesn't know what to do. She is waiting on a call back from Dr. Cyril Mourning office. She would like an infusion. Her mother can bring her at 3:30 pm. She verbalized appreciation. She stated her employer wants her to get FMLA.

## 2017-04-17 ENCOUNTER — Ambulatory Visit: Payer: BLUE CROSS/BLUE SHIELD | Admitting: Neurology

## 2017-04-17 DIAGNOSIS — M5481 Occipital neuralgia: Secondary | ICD-10-CM | POA: Diagnosis not present

## 2017-04-17 DIAGNOSIS — M47812 Spondylosis without myelopathy or radiculopathy, cervical region: Secondary | ICD-10-CM | POA: Diagnosis not present

## 2017-04-17 DIAGNOSIS — G894 Chronic pain syndrome: Secondary | ICD-10-CM | POA: Diagnosis not present

## 2017-04-17 DIAGNOSIS — M545 Low back pain: Secondary | ICD-10-CM | POA: Diagnosis not present

## 2017-04-19 ENCOUNTER — Telehealth: Payer: Self-pay | Admitting: *Deleted

## 2017-04-19 ENCOUNTER — Telehealth: Payer: Self-pay | Admitting: Neurology

## 2017-04-19 NOTE — Telephone Encounter (Signed)
Dr. Jaynee Eagles has ordered MRI brain & c-spine. Called pt to make her aware. LVM asking for call back.

## 2017-04-19 NOTE — Telephone Encounter (Signed)
Patient is scheduled at [facility] for Friday 04/20/17 arrival time is 2:30 pm, I did leave a detail voicemail on patient number about this.   BCBS Auth: [id number] (exp. 04/19/17 to 05/18/17).

## 2017-04-19 NOTE — Telephone Encounter (Signed)
Pt returned call is aware of MRI of brain and c-spine

## 2017-04-19 NOTE — Addendum Note (Signed)
Addended by: Sarina Ill B on: 04/19/2017 12:25 PM   Modules accepted: Orders

## 2017-04-20 ENCOUNTER — Encounter (INDEPENDENT_AMBULATORY_CARE_PROVIDER_SITE_OTHER): Payer: Self-pay

## 2017-04-20 ENCOUNTER — Ambulatory Visit (HOSPITAL_COMMUNITY)
Admission: RE | Admit: 2017-04-20 | Discharge: 2017-04-20 | Disposition: A | Discharge status: Home / Self Care | Payer: BLUE CROSS/BLUE SHIELD | Source: Ambulatory Visit | Attending: Neurology | Admitting: Neurology

## 2017-04-20 DIAGNOSIS — R51 Headache with orthostatic component, not elsewhere classified: Secondary | ICD-10-CM

## 2017-04-20 DIAGNOSIS — G43611 Persistent migraine aura with cerebral infarction, intractable, with status migrainosus: Secondary | ICD-10-CM

## 2017-04-20 DIAGNOSIS — M545 Low back pain: Secondary | ICD-10-CM | POA: Diagnosis not present

## 2017-04-20 DIAGNOSIS — G4453 Primary thunderclap headache: Secondary | ICD-10-CM

## 2017-04-20 DIAGNOSIS — M50223 Other cervical disc displacement at C6-C7 level: Secondary | ICD-10-CM | POA: Insufficient documentation

## 2017-04-20 DIAGNOSIS — G8929 Other chronic pain: Secondary | ICD-10-CM | POA: Diagnosis not present

## 2017-04-20 DIAGNOSIS — G43011 Migraine without aura, intractable, with status migrainosus: Secondary | ICD-10-CM

## 2017-04-20 DIAGNOSIS — G43909 Migraine, unspecified, not intractable, without status migrainosus: Secondary | ICD-10-CM | POA: Diagnosis not present

## 2017-04-20 DIAGNOSIS — R339 Retention of urine, unspecified: Secondary | ICD-10-CM | POA: Diagnosis not present

## 2017-04-20 DIAGNOSIS — I639 Cerebral infarction, unspecified: Secondary | ICD-10-CM

## 2017-04-20 DIAGNOSIS — G44221 Chronic tension-type headache, intractable: Secondary | ICD-10-CM | POA: Diagnosis not present

## 2017-04-20 DIAGNOSIS — R29898 Other symptoms and signs involving the musculoskeletal system: Secondary | ICD-10-CM | POA: Insufficient documentation

## 2017-04-20 DIAGNOSIS — M542 Cervicalgia: Principal | ICD-10-CM

## 2017-04-20 DIAGNOSIS — R519 Headache, unspecified: Secondary | ICD-10-CM

## 2017-04-20 DIAGNOSIS — H539 Unspecified visual disturbance: Secondary | ICD-10-CM

## 2017-04-20 MED ORDER — GADOBENATE DIMEGLUMINE 529 MG/ML IV SOLN
13.0000 mL | Freq: Once | INTRAVENOUS | Status: AC | PRN
  Administered 2017-04-20: 13 mL via INTRAVENOUS

## 2017-04-23 ENCOUNTER — Encounter: Payer: Self-pay | Admitting: Neurology

## 2017-04-23 ENCOUNTER — Ambulatory Visit (INDEPENDENT_AMBULATORY_CARE_PROVIDER_SITE_OTHER): Payer: BLUE CROSS/BLUE SHIELD | Admitting: Neurology

## 2017-04-23 VITALS — BP 150/96 | HR 88

## 2017-04-23 DIAGNOSIS — M5481 Occipital neuralgia: Secondary | ICD-10-CM

## 2017-04-23 MED ORDER — HYDROCODONE-ACETAMINOPHEN 10-325 MG PO TABS: 1.0000 | ORAL_TABLET | Freq: Four times a day (QID) | ORAL | 0 refills | Status: DC | PRN

## 2017-04-23 MED ORDER — FREMANEZUMAB-VFRM 225 MG/1.5ML ~~LOC~~ SOSY: 225.0000 mg | PREFILLED_SYRINGE | SUBCUTANEOUS | 11 refills | Status: DC

## 2017-04-23 MED ORDER — PREGABALIN 150 MG PO CAPS: 150.0000 mg | ORAL_CAPSULE | Freq: Two times a day (BID) | ORAL | 5 refills | Status: DC

## 2017-04-23 NOTE — Progress Notes (Signed)
Performed by Dr. Jaynee Eagles M.D. All procedures a documented blood were medically necessary, reasonable and appropriate based on the patient's history, medical diagnosis and physician opinion. Verbal informed consent was obtained from the patient, patient was informed of potential risk of procedure, including bruising, bleeding, hematoma formation, infection, muscle weakness, muscle pain, numbness, transient hypertension, transient hyperglycemia and transient insomnia among others. All areas injected were topically clean with isopropyl rubbing alcohol. Nonsterile nonlatex gloves were worn during the procedure.  1. Greater occipital nerve block [phone]). The greater occipital nerve site was identified at the nuchal line medial to the occipital artery. Medication was injected into the left and right occipital nerve areas and suboccipital areas. Patient's condition is associated with inflammation of the greater occipital nerve and associated multiple groups. Injection was deemed medically necessary, reasonable and appropriate. Injection represents a separate and unique surgical service.  2. Lesser occipital nerve block [phone]). The lesser occipital nerve site was identified approximately 2 cm lateral to the greater occipital nerve. Occasion was injected into the left and right occipital nerve areas. Patient's condition is associated with inflammation of the lesser occipital nerve and associated muscle groups. Injection was deemed medically necessary, reasonable and appropriate. Injection represents a separate and unique surgical service.   Will start Lyrica today for occipital neuralgia

## 2017-04-23 NOTE — Progress Notes (Signed)
Nerve blocks: 0.5 % Bupivocaine 4.5 mL LOT: [id number] EXP: 05/10/2018 NDC: 8592-9244-62  2% Lidocaine 4.5 mL LOT: [id number] EXP: 09/10/2018 NDC: 8638-1771-16 //BCrn

## 2017-04-24 ENCOUNTER — Telehealth: Payer: Self-pay | Admitting: *Deleted

## 2017-04-24 MED ORDER — GABAPENTIN 300 MG PO CAPS: 300.0000 mg | ORAL_CAPSULE | Freq: Three times a day (TID) | ORAL | 2 refills | Status: DC

## 2017-04-24 NOTE — Telephone Encounter (Signed)
PA for Lyrica 150 mg completed on Cover My Meds. KEY: [id number]. Requested urgent review.   Your information has been submitted to [place]. [employer] [place] will review the request and fax you a determination directly, typically within 3 business days of your submission once all necessary information is received. If [employer] has not responded in 3 business days or if you have any questions about your submission, contact [place] at [phone].

## 2017-04-24 NOTE — Telephone Encounter (Addendum)
Lyrica 150 mg PA denied. Dr. Jaynee Eagles aware. She has offered pt Gabapentin 300 mg TID then increase to 600 mg TID as long as pt is not having side effects.   Spoke with pt. Discussed that due to insurance denial of Lyrica, Dr. Jaynee Eagles has offered Gabapentin 300 mg (1 capsule) three times daily then increase to 600 mg (2 capsules) three times daily as long as pt is not having side effects. Pt verbalized understanding. She has taken Gabapentin before and is aware that it can cause drowsiness and she was encouraged to check full list of side effects that comes with medication. She will call with any questions or speak with pharmacist if needed. Gabapentin e-scribed to pharmacy. D/c Lyrica on med list.

## 2017-04-24 NOTE — Addendum Note (Signed)
Addended by: Gildardo Griffes on: 04/24/2017 04:18 PM   Modules accepted: Orders

## 2017-04-25 ENCOUNTER — Telehealth: Payer: Self-pay | Admitting: *Deleted

## 2017-04-25 NOTE — Telephone Encounter (Addendum)
Pt [employer] form and [employer] both faxed on 05/11/17

## 2017-05-02 DIAGNOSIS — G894 Chronic pain syndrome: Secondary | ICD-10-CM | POA: Diagnosis not present

## 2017-05-02 DIAGNOSIS — M47812 Spondylosis without myelopathy or radiculopathy, cervical region: Secondary | ICD-10-CM | POA: Diagnosis not present

## 2017-05-02 DIAGNOSIS — M5481 Occipital neuralgia: Secondary | ICD-10-CM | POA: Diagnosis not present

## 2017-05-02 DIAGNOSIS — M542 Cervicalgia: Secondary | ICD-10-CM | POA: Diagnosis not present

## 2017-05-02 DIAGNOSIS — Z0289 Encounter for other administrative examinations: Secondary | ICD-10-CM

## 2017-05-03 DIAGNOSIS — Z0289 Encounter for other administrative examinations: Secondary | ICD-10-CM

## 2017-05-07 NOTE — Telephone Encounter (Signed)
Pt called stating she will need her short term disability papers faxed to [employer] at [phone]

## 2017-05-09 ENCOUNTER — Other Ambulatory Visit: Payer: Self-pay | Admitting: Neurology

## 2017-05-09 ENCOUNTER — Other Ambulatory Visit: Payer: Self-pay | Admitting: Advanced Practice Midwife

## 2017-05-09 MED ORDER — LIDOCAINE 5 % EX OINT: 1.0000 "application " | TOPICAL_OINTMENT | CUTANEOUS | 0 refills | Status: DC | PRN

## 2017-05-09 MED ORDER — VALACYCLOVIR HCL 1 G PO TABS: 1000.0000 mg | ORAL_TABLET | Freq: Two times a day (BID) | ORAL | 0 refills | Status: AC

## 2017-05-09 NOTE — Progress Notes (Signed)
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 Still very sore.  Rx lidocaine jelly and Valtrex 1 gm BID for 10 days total

## 2017-05-10 NOTE — Telephone Encounter (Signed)
Short Term disability papers were completed, signed, and sent to medical records for processing via fax to [facility] per pt request. Due by May 5.

## 2017-05-10 NOTE — Telephone Encounter (Signed)
Spoke with pt today. Plan is for pt to be out of work for three months starting 04/16/17. She stated she will go back earlier if she can and stated that this was discussed with her employer. She anticipates that Gabapentin and Ajovy will continue to help her improve. She still has more injections to receive from Dr. Ace Gins.   RN discussed with Dr. Jaynee Eagles.

## 2017-05-14 DIAGNOSIS — Z01419 Encounter for gynecological examination (general) (routine) without abnormal findings: Secondary | ICD-10-CM | POA: Diagnosis not present

## 2017-05-14 DIAGNOSIS — Z1231 Encounter for screening mammogram for malignant neoplasm of breast: Secondary | ICD-10-CM | POA: Diagnosis not present

## 2017-05-14 DIAGNOSIS — Z6823 Body mass index (BMI) 23.0-23.9, adult: Secondary | ICD-10-CM | POA: Diagnosis not present

## 2017-05-15 ENCOUNTER — Telehealth: Payer: Self-pay | Admitting: Neurology

## 2017-05-15 NOTE — Telephone Encounter (Signed)
Pt called states Heather/Dr [facility] office [phone] said Sherre Poot requesting: consultation notes, final treatment plan, medical surgery history, cc of medication list prior to her having surgery. If you have any questions you can call Heather or the pt. Pt said she has a consultation for cartiriazation of nerves occipital head but the office needs these notes for PA.

## 2017-05-16 ENCOUNTER — Other Ambulatory Visit: Payer: Self-pay | Admitting: Neurology

## 2017-05-16 ENCOUNTER — Telehealth: Payer: Self-pay | Admitting: Neurology

## 2017-05-16 DIAGNOSIS — M542 Cervicalgia: Secondary | ICD-10-CM | POA: Diagnosis not present

## 2017-05-16 DIAGNOSIS — G894 Chronic pain syndrome: Secondary | ICD-10-CM | POA: Diagnosis not present

## 2017-05-16 DIAGNOSIS — M47812 Spondylosis without myelopathy or radiculopathy, cervical region: Secondary | ICD-10-CM | POA: Diagnosis not present

## 2017-05-16 DIAGNOSIS — M5481 Occipital neuralgia: Secondary | ICD-10-CM | POA: Diagnosis not present

## 2017-05-16 NOTE — Telephone Encounter (Signed)
I called [facility] and requested a refill on the patients Botox.

## 2017-05-19 ENCOUNTER — Other Ambulatory Visit: Payer: Self-pay | Admitting: Neurology

## 2017-05-21 NOTE — Telephone Encounter (Signed)
I called to check status and the medication is still pending.

## 2017-05-22 NOTE — Telephone Encounter (Signed)
Faxed signed prescription to pharmacy. Received a receipt of confirmation.

## 2017-05-23 ENCOUNTER — Encounter: Payer: Self-pay | Admitting: Neurology

## 2017-05-23 ENCOUNTER — Ambulatory Visit: Payer: BLUE CROSS/BLUE SHIELD | Admitting: Neurology

## 2017-05-23 ENCOUNTER — Telehealth: Payer: Self-pay | Admitting: Neurology

## 2017-05-23 DIAGNOSIS — G43709 Chronic migraine without aura, not intractable, without status migrainosus: Secondary | ICD-10-CM | POA: Diagnosis not present

## 2017-05-23 DIAGNOSIS — G43711 Chronic migraine without aura, intractable, with status migrainosus: Secondary | ICD-10-CM | POA: Diagnosis not present

## 2017-05-23 NOTE — Telephone Encounter (Signed)
12 wk botox inj °

## 2017-05-23 NOTE — Progress Notes (Signed)
Xeomin 100 units/ mL x 2 vials Lot: [id number] Expiration: 12/2018 NDC: 9355-2174-71  Bacteriostatic 0.9% Sodium Chloride- 36mL total Lot: [id number] Expiration: 05/10/2018 NDC: 7289-7915-04  Dx: H36.438 Samples  Unable to auscultate pt's BP with two attempts manually and two attempts via machine. Attempts were taken at beginning of visit and after oral hydration with water. Dr. Jaynee Eagles aware. Pt taken by MD to infusion suite for 500 mL of Normal Saline via IV. Pt states the Tizanidine gives her hypotension. Pt's BP 70s/50s in infusion prior to fluids.  //BCrn

## 2017-05-24 NOTE — Progress Notes (Signed)

## 2017-05-24 NOTE — Telephone Encounter (Addendum)
Received duplicate request for Physician Attestation form completion. Was previously filled out and signed by Dr. Jaynee Eagles. Information still current. Also requested medical records since 04/18/17. These were printed and included with form. Release form was signed by patient.   Spoke with patient. She stated that she started treatment for her migraines in 1989 and has been treated since, about 10 years starting with Dr. Lennox Grumbles followed by at least 10 years with Dr. Sima Matas. Information included on form.   Patient was seen in office yesterday for Xeomin injection and IV fluids. She stated she feels better today.   Physician attestation form and recent office injection visits sent to medical records for processing.

## 2017-05-29 NOTE — Telephone Encounter (Signed)
I called to schedule the patient but she did not answer and her VM was full.  °

## 2017-06-13 ENCOUNTER — Other Ambulatory Visit: Payer: Self-pay | Admitting: Neurology

## 2017-06-13 DIAGNOSIS — R51 Headache: Secondary | ICD-10-CM | POA: Diagnosis not present

## 2017-06-19 DIAGNOSIS — G43709 Chronic migraine without aura, not intractable, without status migrainosus: Secondary | ICD-10-CM | POA: Diagnosis not present

## 2017-06-23 ENCOUNTER — Other Ambulatory Visit: Payer: Self-pay | Admitting: Neurology

## 2017-07-04 ENCOUNTER — Encounter: Payer: Self-pay | Admitting: Neurology

## 2017-07-04 ENCOUNTER — Telehealth: Payer: Self-pay | Admitting: *Deleted

## 2017-07-04 DIAGNOSIS — Z0289 Encounter for other administrative examinations: Secondary | ICD-10-CM

## 2017-07-04 NOTE — Telephone Encounter (Signed)
Pt unum form on [employer].

## 2017-07-05 NOTE — Telephone Encounter (Signed)
[employer] disability forms completed, signed & sent to medical records for processing. Reimbursement can be provided for cost of form completion. Instructions included on the form. Copy of last office note also provided.

## 2017-07-08 ENCOUNTER — Encounter: Payer: Self-pay | Admitting: Neurology

## 2017-07-09 NOTE — Telephone Encounter (Signed)
I faxed pt [employer] disability form and last office note to [phone].

## 2017-07-11 DIAGNOSIS — R51 Headache: Secondary | ICD-10-CM | POA: Diagnosis not present

## 2017-07-16 ENCOUNTER — Other Ambulatory Visit: Payer: Self-pay | Admitting: Neurology

## 2017-07-16 ENCOUNTER — Encounter (INDEPENDENT_AMBULATORY_CARE_PROVIDER_SITE_OTHER): Payer: Self-pay

## 2017-07-16 MED ORDER — AMITRIPTYLINE HCL 50 MG PO TABS: 150.0000 mg | ORAL_TABLET | Freq: Every day | ORAL | 11 refills | Status: DC

## 2017-07-19 ENCOUNTER — Telehealth: Payer: Self-pay | Admitting: *Deleted

## 2017-07-19 NOTE — Telephone Encounter (Signed)
[employer] forms completed, signed and sent to medical records for processing.

## 2017-07-23 ENCOUNTER — Other Ambulatory Visit: Payer: Self-pay | Admitting: Neurology

## 2017-07-28 ENCOUNTER — Encounter: Payer: Self-pay | Admitting: Neurology

## 2017-07-30 ENCOUNTER — Other Ambulatory Visit: Payer: Self-pay | Admitting: Neurology

## 2017-07-30 ENCOUNTER — Telehealth: Payer: Self-pay | Admitting: *Deleted

## 2017-07-30 ENCOUNTER — Encounter: Payer: Self-pay | Admitting: Neurology

## 2017-07-30 DIAGNOSIS — M542 Cervicalgia: Secondary | ICD-10-CM

## 2017-07-30 DIAGNOSIS — G43711 Chronic migraine without aura, intractable, with status migrainosus: Secondary | ICD-10-CM

## 2017-07-30 DIAGNOSIS — M7918 Myalgia, other site: Secondary | ICD-10-CM

## 2017-07-30 NOTE — Telephone Encounter (Signed)
I faxed pt form to [employer] on 07/20/17

## 2017-08-01 NOTE — Telephone Encounter (Signed)
Pt had written a letter on 07/11/17 with update on plan of care. This was reviewed by Dr. Jaynee Eagles. It included the following plan:  LP 07/11/17 @ [facility] to check fluid level Return office visit for pre-op July 17th w/ Dr. Maryjean Ka for neurostimulator.  -Pt said her migraines are still almost daily with pain level being over a 8 to 10. She's only had a handful of days with no migraine since March 15th.

## 2017-08-09 DIAGNOSIS — F0634 Mood disorder due to known physiological condition with mixed features: Secondary | ICD-10-CM | POA: Diagnosis not present

## 2017-08-14 DIAGNOSIS — R030 Elevated blood-pressure reading, without diagnosis of hypertension: Secondary | ICD-10-CM | POA: Diagnosis not present

## 2017-08-14 DIAGNOSIS — G43709 Chronic migraine without aura, not intractable, without status migrainosus: Secondary | ICD-10-CM | POA: Diagnosis not present

## 2017-08-15 ENCOUNTER — Ambulatory Visit (HOSPITAL_COMMUNITY): Payer: BLUE CROSS/BLUE SHIELD | Admitting: Physical Therapy

## 2017-08-23 ENCOUNTER — Other Ambulatory Visit: Payer: Self-pay | Admitting: Neurology

## 2017-08-23 DIAGNOSIS — N951 Menopausal and female climacteric states: Secondary | ICD-10-CM | POA: Diagnosis not present

## 2017-08-23 NOTE — Telephone Encounter (Signed)
Ok to refill again per Dr. Jaynee Eagles.

## 2017-09-07 ENCOUNTER — Other Ambulatory Visit: Payer: Self-pay | Admitting: Neurology

## 2017-09-17 ENCOUNTER — Telehealth: Payer: Self-pay | Admitting: Neurology

## 2017-09-17 ENCOUNTER — Other Ambulatory Visit: Payer: Self-pay | Admitting: Neurology

## 2017-09-17 NOTE — Telephone Encounter (Signed)
Pt requesting a call, stating she would like to come in for a nerve block tomorrow. Please advise

## 2017-09-17 NOTE — Telephone Encounter (Signed)
Ok per Dr. Jaynee Eagles. Called pt and offered her the 1:00 appointment arrival 12:45 tomorrow 09/18/17. She verbalized appreciation. Pt was scheduled.

## 2017-09-18 ENCOUNTER — Ambulatory Visit (INDEPENDENT_AMBULATORY_CARE_PROVIDER_SITE_OTHER): Payer: BLUE CROSS/BLUE SHIELD | Admitting: Neurology

## 2017-09-18 ENCOUNTER — Other Ambulatory Visit: Payer: Self-pay | Admitting: *Deleted

## 2017-09-18 DIAGNOSIS — R51 Headache: Secondary | ICD-10-CM

## 2017-09-18 DIAGNOSIS — G43709 Chronic migraine without aura, not intractable, without status migrainosus: Secondary | ICD-10-CM | POA: Diagnosis not present

## 2017-09-18 DIAGNOSIS — G8929 Other chronic pain: Secondary | ICD-10-CM

## 2017-09-18 DIAGNOSIS — M5481 Occipital neuralgia: Secondary | ICD-10-CM

## 2017-09-18 DIAGNOSIS — R519 Headache, unspecified: Secondary | ICD-10-CM

## 2017-09-18 MED ORDER — HYDROCODONE-ACETAMINOPHEN 10-325 MG PO TABS: 1.0000 | ORAL_TABLET | Freq: Four times a day (QID) | ORAL | 0 refills | Status: DC | PRN

## 2017-09-18 MED ORDER — BUTALBITAL-APAP-CAFFEINE 50-325-40 MG PO TABS: ORAL_TABLET | ORAL | 4 refills | Status: DC

## 2017-09-18 NOTE — Progress Notes (Signed)
Nerve block w/o steroid: Pt signed consent  0.5% Bupivocaine 6 mL LOT: [id number] EXP: 09/2020  2% Lidocaine 6 mL LOT: [id number] EXP: 02/10/2019

## 2017-09-18 NOTE — Progress Notes (Signed)
Performed by Dr. Jaynee Eagles M.D. All procedures a documented blood were medically necessary, reasonable and appropriate based on the patient's history, medical diagnosis and physician opinion. Verbal informed consent was obtained from the patient, patient was informed of potential risk of procedure, including bruising, bleeding, hematoma formation, infection, muscle weakness, muscle pain, numbness, transient hypertension, transient hyperglycemia and transient insomnia among others. All areas injected were topically clean with isopropyl rubbing alcohol. Nonsterile nonlatex gloves were worn during the procedure.  1. Greater occipital nerve block [phone]). The greater occipital nerve site was identified at the nuchal line medial to the occipital artery. Medication was injected into the left and right occipital nerve areas and suboccipital areas. Patient's condition is associated with inflammation of the greater occipital nerve and associated multiple groups. Injection was deemed medically necessary, reasonable and appropriate. Injection represents a separate and unique surgical service.  2. Lesser occipital nerve block [phone]). The lesser occipital nerve site was identified approximately 2 cm lateral to the greater occipital nerve. Occasion was injected into the left and right occipital nerve areas. Patient's condition is associated with inflammation of the lesser occipital nerve and associated muscle groups. Injection was deemed medically necessary, reasonable and appropriate. Injection represents a separate and unique surgical service.   3. Auriculotemporal nerve block ([id number]): The Auriculotemporal nerve site was identified along the posterior margin of the sternocleidomastoid muscle toward the base of the ear. Medication was injected into the left and right radicular temporal nerve areas. Patient's condition is associated with inflammation of the Auriculotemporal Nerve and associated muscle groups. Injection was  deemed medically necessary, reasonable and appropriate. Injection represents a separate and unique surgical service.  4. Supraorbital nerve block ([id number]): Supraorbital nerve site was identified along the incision of the frontal bone on the orbital/supraorbital ridge. Medication was injected into the left and right supraorbital nerve areas. Patient's condition is associated with inflammation of the supraorbital and associated muscle groups. Injection was deemed medically necessary, reasonable and appropriate. Injection represents a separate and unique surgical service.

## 2017-09-18 NOTE — Patient Instructions (Signed)
Acetaminophen; Hydrocodone tablets or capsules What is this medicine? ACETAMINOPHEN; HYDROCODONE (a set a MEE noe fen; hye droe KOE done) is a pain reliever. It is used to treat moderate to severe pain. This medicine may be used for other purposes; ask your health care provider or pharmacist if you have questions. COMMON BRAND NAME(S): Anexsia, Bancap HC, Ceta-Plus, Co-Gesic, Comfortpak, Dolagesic, [employer], DuoCet, Hydrocet, Hydrogesic, [place], Lorcet HD, Lorcet Plus, Lortab, Margesic H, Maxidone, [place], Polygesic, [place], [place], [place], Vicodin, Vicodin ES, Vicodin HP, Charlane Ferretti What should I tell my health care provider before I take this medicine? They need to know if you have any of these conditions: -brain tumor -Crohn's disease, inflammatory bowel disease, or ulcerative colitis -drug abuse or addiction -head injury -heart or circulation problems -if you often drink alcohol -kidney disease or problems going to the bathroom -liver disease -lung disease, asthma, or breathing problems -an unusual or allergic reaction to acetaminophen, hydrocodone, other opioid analgesics, other medicines, foods, dyes, or preservatives -pregnant or trying to get pregnant -breast-feeding How should I use this medicine? Take this medicine by mouth with a glass of water. Follow the directions on the prescription label. You can take it with or without food. If it upsets your stomach, take it with food. Do not take your medicine more often than directed. A special MedGuide will be given to you by the pharmacist with each prescription and refill. Be sure to read this information carefully each time. Talk to your pediatrician regarding the use of this medicine in children. Special care may be needed. Overdosage: If you think you have taken too much of this medicine contact a poison control center or emergency room at once. NOTE: This medicine is only for you. Do not share this medicine with  others. What if I miss a dose? If you miss a dose, take it as soon as you can. If it is almost time for your next dose, take only that dose. Do not take double or extra doses. What may interact with this medicine? This medicine may interact with the following medications: -alcohol -antiviral medicines for HIV or AIDS -atropine -antihistamines for allergy, cough and cold -certain antibiotics like erythromycin, clarithromycin -certain medicines for anxiety or sleep -certain medicines for bladder problems like oxybutynin, tolterodine -certain medicines for depression like amitriptyline, fluoxetine, sertraline -certain medicines for fungal infections like ketoconazole and itraconazole -certain medicines for Parkinson's disease like benztropine, trihexyphenidyl -certain medicines for seizures like carbamazepine, phenobarbital, phenytoin, primidone -certain medicines for stomach problems like dicyclomine, hyoscyamine -certain medicines for travel sickness like scopolamine -general anesthetics like halothane, isoflurane, methoxyflurane, propofol -ipratropium -local anesthetics like lidocaine, pramoxine, tetracaine -MAOIs like Carbex, Eldepryl, Marplan, Nardil, and Parnate -medicines that relax muscles for surgery -other medicines with acetaminophen -other narcotic medicines for pain or cough -phenothiazines like chlorpromazine, mesoridazine, prochlorperazine, thioridazine -rifampin This list may not describe all possible interactions. Give your health care provider a list of all the medicines, herbs, non-prescription drugs, or dietary supplements you use. Also tell them if you smoke, drink alcohol, or use illegal drugs. Some items may interact with your medicine. What should I watch for while using this medicine? Tell your doctor or health care professional if your pain does not go away, if it gets worse, or if you have new or a different type of pain. You may develop tolerance to the medicine.  Tolerance means that you will need a higher dose of the medicine for pain relief. Tolerance is normal and is expected if  you take the medicine for a long time. Do not suddenly stop taking your medicine because you may develop a severe reaction. Your body becomes used to the medicine. This does NOT mean you are addicted. Addiction is a behavior related to getting and using a drug for a non-medical reason. If you have pain, you have a medical reason to take pain medicine. Your doctor will tell you how much medicine to take. If your doctor wants you to stop the medicine, the dose will be slowly lowered over time to avoid any side effects. There are different types of narcotic medicines (opiates). If you take more than one type at the same time or if you are taking another medicine that also causes drowsiness, you may have more side effects. Give your health care provider a list of all medicines you use. Your doctor will tell you how much medicine to take. Do not take more medicine than directed. Call emergency for help if you have problems breathing or unusual sleepiness. Do not take other medicines that contain acetaminophen with this medicine. Always read labels carefully. If you have questions, ask your doctor or pharmacist. If you take too much acetaminophen get medical help right away. Too much acetaminophen can be very dangerous and cause liver damage. Even if you do not have symptoms, it is important to get help right away. You may get drowsy or dizzy. Do not drive, use machinery, or do anything that needs mental alertness until you know how this medicine affects you. Do not stand or sit up quickly, especially if you are an older patient. This reduces the risk of dizzy or fainting spells. Alcohol may interfere with the effect of this medicine. Avoid alcoholic drinks. The medicine will cause constipation. Try to have a bowel movement at least every 2 to 3 days. If you do not have a bowel movement for 3  days, call your doctor or health care professional. Your mouth may get dry. Chewing sugarless gum or sucking hard candy, and drinking plenty of water may help. Contact your doctor if the problem does not go away or is severe. What side effects may I notice from receiving this medicine? Side effects that you should report to your doctor or health care professional as soon as possible: -allergic reactions like skin rash, itching or hives, swelling of the face, lips, or tongue -breathing problems -confusion -redness, blistering, peeling or loosening of the skin, including inside the mouth -signs and symptoms of low blood pressure like dizziness; feeling faint or lightheaded, falls; unusually weak or tired -trouble passing urine or change in the amount of urine -yellowing of the eyes or skin Side effects that usually do not require medical attention (report to your doctor or health care professional if they continue or are bothersome): -constipation -dry mouth -nausea, vomiting -tiredness This list may not describe all possible side effects. Call your doctor for medical advice about side effects. You may report side effects to FDA at 1-800-FDA-1088. Where should I keep my medicine? Keep out of the reach of children. This medicine can be abused. Keep your medicine in a safe place to protect it from theft. Do not share this medicine with anyone. Selling or giving away this medicine is dangerous and against the law. This medicine may cause accidental overdose and death if it taken by other adults, children, or pets. Mix any unused medicine with a substance like cat litter or coffee grounds. Then throw the medicine away in a sealed container like  a sealed bag or a coffee can with a lid. Do not use the medicine after the expiration date. Store at room temperature between 15 and 30 degrees C (59 and 86 degrees F). NOTE: This sheet is a summary. It may not cover all possible information. If you have  questions about this medicine, talk to your doctor, pharmacist, or health care provider.  2018 Elsevier/Gold Standard (2014-09-18 10:02:16)

## 2017-09-20 ENCOUNTER — Telehealth: Payer: Self-pay | Admitting: *Deleted

## 2017-09-20 NOTE — Telephone Encounter (Signed)
Faxed signed Fioricet prescription to [facility]. Received a receipt of confirmation.

## 2017-09-23 ENCOUNTER — Other Ambulatory Visit: Payer: Self-pay | Admitting: Neurology

## 2017-10-11 DIAGNOSIS — G43709 Chronic migraine without aura, not intractable, without status migrainosus: Secondary | ICD-10-CM | POA: Diagnosis not present

## 2017-10-11 DIAGNOSIS — M5481 Occipital neuralgia: Secondary | ICD-10-CM | POA: Diagnosis not present

## 2017-10-11 DIAGNOSIS — H53149 Visual discomfort, unspecified: Secondary | ICD-10-CM | POA: Diagnosis not present

## 2017-10-16 ENCOUNTER — Other Ambulatory Visit: Payer: Self-pay

## 2017-10-16 ENCOUNTER — Emergency Department (HOSPITAL_COMMUNITY)
Admission: EM | Admit: 2017-10-16 | Discharge: 2017-10-17 | Disposition: A | Discharge status: Home / Self Care | Payer: BLUE CROSS/BLUE SHIELD | Attending: Emergency Medicine | Admitting: Emergency Medicine

## 2017-10-16 ENCOUNTER — Encounter (HOSPITAL_COMMUNITY): Payer: Self-pay | Admitting: Emergency Medicine

## 2017-10-16 ENCOUNTER — Other Ambulatory Visit: Payer: Self-pay | Admitting: Neurology

## 2017-10-16 DIAGNOSIS — R51 Headache: Secondary | ICD-10-CM | POA: Diagnosis not present

## 2017-10-16 DIAGNOSIS — M5481 Occipital neuralgia: Secondary | ICD-10-CM | POA: Diagnosis not present

## 2017-10-16 DIAGNOSIS — E039 Hypothyroidism, unspecified: Secondary | ICD-10-CM | POA: Diagnosis not present

## 2017-10-16 DIAGNOSIS — Z79899 Other long term (current) drug therapy: Secondary | ICD-10-CM | POA: Insufficient documentation

## 2017-10-16 HISTORY — PX: OTHER SURGICAL HISTORY: SHX169

## 2017-10-16 MED ORDER — DEXAMETHASONE SODIUM PHOSPHATE 10 MG/ML IJ SOLN
10.0000 mg | Freq: Once | INTRAMUSCULAR | Status: AC
  Administered 2017-10-16: 10 mg via INTRAVENOUS
  Filled 2017-10-16: qty 1

## 2017-10-16 MED ORDER — DIPHENHYDRAMINE HCL 50 MG/ML IJ SOLN
50.0000 mg | Freq: Once | INTRAMUSCULAR | Status: AC
  Administered 2017-10-16: 50 mg via INTRAVENOUS
  Filled 2017-10-16: qty 1

## 2017-10-16 MED ORDER — METOCLOPRAMIDE HCL 5 MG/ML IJ SOLN
10.0000 mg | Freq: Once | INTRAMUSCULAR | Status: AC
  Administered 2017-10-16: 10 mg via INTRAVENOUS
  Filled 2017-10-16: qty 2

## 2017-10-16 MED ORDER — SODIUM CHLORIDE 0.9 % IV BOLUS
1000.0000 mL | Freq: Once | INTRAVENOUS | Status: AC
  Administered 2017-10-16: 1000 mL via INTRAVENOUS

## 2017-10-16 MED ORDER — KETOROLAC TROMETHAMINE 30 MG/ML IJ SOLN
15.0000 mg | Freq: Once | INTRAMUSCULAR | Status: AC
  Administered 2017-10-16: 15 mg via INTRAVENOUS
  Filled 2017-10-16: qty 1

## 2017-10-16 NOTE — ED Provider Notes (Signed)
[facility] EMERGENCY DEPARTMENT Provider Note   CSN: [id number] Arrival date & time: 10/16/17  2131     History   Chief Complaint Chief Complaint  Patient presents with  . Migraine    [name] is a 47 y.o. female with a history of severe migraines which have failed local neurological intervention, reporting has had a daily chronic posterior headache since March when she was given a trial of Aimovig at which time she developed a constant 6/10 posterior neck and occipital headache that has not responded to localized nerve block injections. She denies fevers or chills, denies neck stiffness, focal weakness, dizziness or confusion but does have photophobia, nausea and vomiting. She is scheduled to see a plastic surgeon in 10 days in anticipation of occipital nerve ablation surgery.  She has found no alleviators for her symptoms.   The history is provided by the patient.    Past Medical History:  Diagnosis Date  . Headache(784.0)    migraines  . Hypothyroidism   . Migraines     Patient Active Problem List   Diagnosis Date Noted  . Chronic migraine w/o aura w/o status migrainosus, not intractable 08/22/2015  . Abdominal pain, epigastric 07/03/2013  . Acute kidney injury ([place]) 10/20/2011  . Nausea & vomiting 10/19/2011  . Suprapubic abdominal pain 10/19/2011  . Dehydration 10/19/2011  . Hypothyroidism 10/19/2011  . Migraines 10/19/2011    Past Surgical History:  Procedure Laterality Date  . TUBAL LIGATION    . uterine ablation    . uterine polyps       OB History    Gravida      Para      Term      Preterm      AB      Living  0     SAB      TAB      Ectopic      Multiple      Live Births               Home Medications    Prior to Admission medications   Medication Sig Start Date End Date Taking? Authorizing Provider  ALPRAZolam Duanne Moron) 1 MG tablet Take 1 mg by mouth at bedtime. 05/01/16  Yes [provider]    amitriptyline (ELAVIL) 50 MG tablet Take 3 tablets (150 mg total) by mouth at bedtime. 07/16/17  Yes Melvenia Beam, MD  butalbital-acetaminophen-caffeine (FIORICET, ESGIC) 50-325-40 MG tablet TAKE (1) TABLET BY MOUTH EVERY SIX HOURS AS NEEDED. Max 10 per month. Patient taking differently: Take 1 tablet by mouth every 6 (six) hours as needed for headache or migraine. . Max 10 per month. 09/18/17  Yes Melvenia Beam, MD  HYDROcodone-acetaminophen (NORCO) 10-325 MG tablet Take 1 tablet by mouth every 6 (six) hours as needed for moderate pain or severe pain.  09/18/17  Yes [provider]  levothyroxine (SYNTHROID, LEVOTHROID) 150 MCG tablet Take 150 mcg by mouth daily before breakfast.   Yes [provider]  Multiple Vitamin (MULTIVITAMIN WITH MINERALS) TABS tablet Take 1 tablet by mouth at bedtime.   Yes [provider]  ondansetron (ZOFRAN ODT) 4 MG disintegrating tablet Take 1-2 tablets (4 mg total) by mouth every 8 (eight) hours as needed Patient taking differently: Take 4-8 mg by mouth every 8 (eight) hours as needed for nausea or vomiting. Take 1-2 tablets (4 mg total) by mouth every 8 (eight) hours as needed 08/18/15  Yes Sarina Ill  B, MD  prochlorperazine (COMPAZINE) 10 MG tablet Take 1 tablet (10 mg total) by mouth every 8 (eight) hours as needed for nausea or vomiting (Nausea). 09/19/16  Yes Melvenia Beam, MD  SUMAtriptan (IMITREX) 100 MG tablet TAKE 1 TABLET BY MOUTH EVERY 2 HOURS AS NEEDED FOR MIGRAINE. MAX 2 DOSES PER DAY. Patient taking differently: Take 100 mg by mouth every 2 (two) hours as needed for migraine or headache. MAX 2 DOSES PER DAY. 05/10/17  Yes Melvenia Beam, MD  SUMAtriptan 6 MG/0.5ML SOAJ INJECT 6MG  UNDER THE SKIN, MAY REPEAT IN 2 HOURS. MAX OF 2 IN 24 HOURS. Patient taking differently: Inject 6 mg into the skin as needed (for migraine pain. *Max of 2 doses within 24 hours).  09/07/17  Yes Melvenia Beam, MD  tiZANidine (ZANAFLEX) 4 MG  tablet TAKE ONE TABLET BY MOUTH EVERY 6 HOURS AS NEEDED FOR MUSCLE SPASMS. Patient taking differently: Take 4 mg by mouth every 6 (six) hours as needed for muscle spasms.  09/24/17  Yes Melvenia Beam, MD  valACYclovir (VALTREX) 1000 MG tablet Take 1 tablet (1,000 mg total) by mouth 2 (two) times daily. 05/09/17  Yes Cresenzo-Dishmon, Joaquim Lai, CNM  vitamin C (ASCORBIC ACID) 500 MG tablet Take 500 mg by mouth at bedtime.   Yes [provider]  lidocaine (LIDODERM) 5 % Place 1 patch onto the skin daily. Remove & Discard patch within 12 hours or as directed by MD 10/17/17   Evalee Jefferson, PA-C  oxyCODONE-acetaminophen (PERCOCET/ROXICET) 5-325 MG tablet Take 1 tablet by mouth every 4 (four) hours as needed. 10/17/17   Madyson Lukach, Almyra Free, PA-C  tiZANidine (ZANAFLEX) 4 MG tablet TAKE ONE TABLET BY MOUTH EVERY 6 HOURS AS NEEDED FOR MUSCLE SPASMS. Patient not taking: Reported on 10/16/2017 10/16/17   Melvenia Beam, MD    Family History Family History  Problem Relation Age of Onset  . Migraines Mother        benign small bowel tumor  . Prostate cancer Father   . Migraines Other     Social History Social History   Tobacco Use  . Smoking status: Never Smoker  . Smokeless tobacco: Never Used  Substance Use Topics  . Alcohol use: Yes    Comment: social  . Drug use: No     Allergies   Aimovig [erenumab]; Compazine [prochlorperazine]; and Emgality [galcanezumab-gnlm]   Review of Systems Review of Systems  Constitutional: Negative for fever.  HENT: Negative for congestion and sore throat.   Eyes: Positive for photophobia.  Respiratory: Negative for chest tightness and shortness of breath.   Cardiovascular: Negative for chest pain.  Gastrointestinal: Positive for nausea and vomiting. Negative for abdominal pain.  Genitourinary: Negative.   Musculoskeletal: Negative for arthralgias, joint swelling and neck pain.  Skin: Negative.  Negative for rash and wound.  Neurological: Positive for  headaches. Negative for dizziness, weakness, light-headedness and numbness.  Psychiatric/Behavioral: Negative.      Physical Exam Updated Vital Signs BP (!) 161/96   Pulse (!) 101   Temp 99.2 F (37.3 C) (Oral)   Resp 16   SpO2 100%   Physical Exam  Constitutional: She is oriented to person, place, and time. She appears well-developed and well-nourished.  Uncomfortable appearing  HENT:  Head: Normocephalic and atraumatic.  Mouth/Throat: Oropharynx is clear and moist.  Eyes: Pupils are equal, round, and reactive to light. EOM are normal.  Neck: Normal range of motion. Neck supple.  Cardiovascular: Normal rate and normal heart sounds.  Pulmonary/Chest: Effort normal.  Abdominal: Soft. There is no tenderness.  Musculoskeletal: Normal range of motion.       Cervical back: She exhibits tenderness. She exhibits no bony tenderness.  ttp along posterior occiput.  Lymphadenopathy:    She has no cervical adenopathy.  Neurological: She is alert and oriented to person, place, and time. She has normal strength. No cranial nerve deficit or sensory deficit. Gait normal. GCS eye subscore is 4. GCS verbal subscore is 5. GCS motor subscore is 6.   Cranial nerves III-XII intact.  No pronator drift. Gait normal.   Skin: Skin is warm and dry. No rash noted.  Psychiatric: She has a normal mood and affect. Her speech is normal and behavior is normal. Thought content normal. Cognition and memory are normal.  Nursing note and vitals reviewed.    ED Treatments / Results  Labs (all labs ordered are listed, but only abnormal results are displayed) Labs Reviewed - No data to display  EKG None  Radiology No results found.  Procedures Procedures (including critical care time)  Medications Ordered in ED Medications  lidocaine (LIDODERM) 5 % 1 patch (1 patch Transdermal Patch Applied 10/17/17 0038)  sodium chloride 0.9 % bolus 1,000 mL (0 mLs Intravenous Stopped 10/16/17 2336)  dexamethasone  (DECADRON) injection 10 mg (10 mg Intravenous Given 10/16/17 2231)  diphenhydrAMINE (BENADRYL) injection 50 mg (50 mg Intravenous Given 10/16/17 2234)  metoCLOPramide (REGLAN) injection 10 mg (10 mg Intravenous Given 10/16/17 2234)  ketorolac (TORADOL) 30 MG/ML injection 15 mg (15 mg Intravenous Given 10/16/17 2334)  HYDROmorphone (DILAUDID) injection 0.5 mg (0.5 mg Intravenous Given 10/17/17 0039)     Initial Impression / Assessment and Plan / ED Course  I have reviewed the triage vital signs and the nursing notes.  Pertinent labs & imaging results that were available during my care of the patient were reviewed by me and considered in my medical decision making (see chart for details).     Pt given migraine cocktail along with NS with headache improvement from 10/10 to 8/10. Added toradol , now 7/10.  Dilaudid 0.5 mg IV also given with improved headache.  Her upcoming procedure per above is with a Psychiatric nurse in [place] that specializes in occipital nerve ablation for tx of migraine.  Pt has exhausted all other medicine options and has been denied by her insurance for a neuro stimulator.  She was given a lidoderm patch for her posterior cervical spine, insurance will not cover these either, suggested Salon Pas otc if our patch is in any way effective.  Oxycodone #15, [place] controlled substance database reviewed.  Plan f/u with her neurologist for any further concerns.  Final Clinical Impressions(s) / ED Diagnoses   Final diagnoses:  Bilateral occipital neuralgia    ED Discharge Orders         Ordered    oxyCODONE-acetaminophen (PERCOCET/ROXICET) 5-325 MG tablet  Every 4 hours PRN     10/17/17 0051    lidocaine (LIDODERM) 5 %  Every 24 hours     10/17/17 0053           Evalee Jefferson, PA-C 10/17/17 [place], [place], DO 10/18/17 1420

## 2017-10-16 NOTE — ED Triage Notes (Signed)
Pt C/O migraine that has been going on everyday since March. Pt states that she see a neurologist for this problem. Pt has occipital neuralgia and is supposed to have surgery for this problem on October 18th. Pt C/O N/V and no blurred vision.

## 2017-10-17 MED ORDER — OXYCODONE-ACETAMINOPHEN 5-325 MG PO TABS: 1.0000 | ORAL_TABLET | ORAL | 0 refills | Status: DC | PRN

## 2017-10-17 MED ORDER — LIDOCAINE 5 % EX PTCH: 1.0000 | MEDICATED_PATCH | CUTANEOUS | 0 refills | Status: DC

## 2017-10-17 MED ORDER — HYDROMORPHONE HCL 1 MG/ML IJ SOLN
0.5000 mg | Freq: Once | INTRAMUSCULAR | Status: AC
  Administered 2017-10-17: 0.5 mg via INTRAVENOUS
  Filled 2017-10-17: qty 1

## 2017-10-17 MED ORDER — LIDOCAINE 5 % EX PTCH
1.0000 | MEDICATED_PATCH | CUTANEOUS | Status: DC
  Administered 2017-10-17: 1 via TRANSDERMAL
  Filled 2017-10-17 (×2): qty 1

## 2017-10-17 NOTE — Discharge Instructions (Signed)
You may take the oxycodone prescribed for pain relief.  This will make you drowsy - do not drive within 4 hours of taking this medication.  If the lidocaine patch applied to your posterior neck is helpful, I recommend getting Salon Pas brand of lidocaine patches if your insurance does not cover the prescription.

## 2017-10-24 DIAGNOSIS — M542 Cervicalgia: Secondary | ICD-10-CM | POA: Diagnosis not present

## 2017-10-24 DIAGNOSIS — M5489 Other dorsalgia: Secondary | ICD-10-CM | POA: Diagnosis not present

## 2017-10-24 DIAGNOSIS — I10 Essential (primary) hypertension: Secondary | ICD-10-CM | POA: Diagnosis not present

## 2017-10-24 DIAGNOSIS — M5481 Occipital neuralgia: Secondary | ICD-10-CM | POA: Diagnosis not present

## 2017-10-25 DIAGNOSIS — M542 Cervicalgia: Secondary | ICD-10-CM | POA: Diagnosis not present

## 2017-10-25 DIAGNOSIS — M5481 Occipital neuralgia: Secondary | ICD-10-CM | POA: Diagnosis not present

## 2017-10-25 DIAGNOSIS — I10 Essential (primary) hypertension: Secondary | ICD-10-CM | POA: Diagnosis not present

## 2017-10-25 DIAGNOSIS — M5489 Other dorsalgia: Secondary | ICD-10-CM | POA: Diagnosis not present

## 2017-10-26 DIAGNOSIS — M5481 Occipital neuralgia: Secondary | ICD-10-CM | POA: Diagnosis not present

## 2017-10-26 DIAGNOSIS — M5489 Other dorsalgia: Secondary | ICD-10-CM | POA: Diagnosis not present

## 2017-10-26 DIAGNOSIS — M542 Cervicalgia: Secondary | ICD-10-CM | POA: Diagnosis not present

## 2017-12-05 DIAGNOSIS — M5481 Occipital neuralgia: Secondary | ICD-10-CM | POA: Diagnosis not present

## 2017-12-05 DIAGNOSIS — G43709 Chronic migraine without aura, not intractable, without status migrainosus: Secondary | ICD-10-CM | POA: Diagnosis not present

## 2017-12-20 DIAGNOSIS — G43709 Chronic migraine without aura, not intractable, without status migrainosus: Secondary | ICD-10-CM | POA: Diagnosis not present

## 2017-12-20 DIAGNOSIS — H53149 Visual discomfort, unspecified: Secondary | ICD-10-CM | POA: Diagnosis not present

## 2017-12-20 DIAGNOSIS — M5481 Occipital neuralgia: Secondary | ICD-10-CM | POA: Diagnosis not present

## 2017-12-21 ENCOUNTER — Encounter: Payer: Self-pay | Admitting: Neurology

## 2018-01-07 NOTE — Progress Notes (Signed)
NEUROLOGY CONSULTATION NOTE  Martha Aguilar MRN: [id number] DOB: 09/05/1970  Referring provider: Sinda Du, MD Primary care provider: Sinda Du, MD  Reason for consult:  migraines  HISTORY OF PRESENT ILLNESS: Martha Aguilar is a 47 year old right-handed female with hypothyroidism who presents for migraines.  History supplemented by ED and prior neurologists' notes.  Onset:  47 years old.   Location:  Middle of head, frontal Quality:  Pounding, pressure Intensity:  Severe.  She denies new headache, thunderclap headache Aura:  no Prodrome:  no Postdrome:  no Associated symptoms:  Photophobia, phonophobia.  She denies associated nausea, vomiting, visual disturbance/aura, autonomic symptoms, unilateral numbness or weakness. Duration:  Within 3 hours with sumatriptan injection Frequency:  Once a week Frequency of abortive medication: every other day to every 2 days Triggers:  Wine, change in weather, fatigue Exacerbating factors:  movement Relieving factors:  Laying in dark and quiet room. Activity:  aggravates  She has associated neck pain that started on March 2019.  She also has bilateral occipital neuralgia described as burning sensation in back of head.  She underwent decompression excision of occipital nerves in October.  She takes Oxtella XR 600mg  twice daily.  She had to leave work [employer], [employer]) due to the pain.    MRI of brain with and without contrast from 04/20/17 personally reviewed and was unremarkable.  MRI of cervical spine without contrast from 04/20/17 also personally reviewed and demonstrated unremarkable small disc protrusions at C5-6 and C6-7 but no explanation for headaches.  She was evaluated at [facility] and underwent a lumbar puncture on 07/11/17 which demonstrated an opening and closing pressure of 15 cm H2O.    Current NSAIDS:  None Current analgesics:  Fioricet (takes 10 tablets a month), Lidocaine 5% patch (for neck pain) Current triptans:  Sumatriptan 100mg   (gradual onset during day), sumatriptan 6mg  [place] (if wakes up with it) Current ergotamine:  None Current anti-emetic:  Zofran ODT 4mg  Current muscle relaxants:  Tizanidine 4mg  Current anti-anxiolytic:  alprazolam Current sleep aide:  alprazolam Current Antihypertensive medications:  None Current Antidepressant medications:  Amitriptyline 150mg  Current Anticonvulsant medications:  Oxtellar XR 600mg  twice daily (occipital neuralgia), Keppra 1000mg  at bedtime, gabapentin 300mg  4-6 times daily Current anti-CGRP:  none Current Vitamins/Herbal/Supplements:  Magnesium, B12, D, C, riboflavin, CoQ10, butterbur Current Antihistamines/Decongestants:  None Other therapy:  Botox Hormone/birth control:  Estrogen  Past NSAIDS:  Ibuprofen, naproxen, Toradol Past steroid:  Prednisone Past analgesics:  Excedrin, tramadol, Norco, Percocet Past abortive triptans:  Sumatriptan tablet/NS/injection, Relpax, Zomig tablet Past abortive ergotamine:  DHE capsule, DHE injection Past muscle relaxants:  Flexeril, tizanidine Past anti-emetic:  Reglan, Zofran, Compazine Past antihypertensive medications:  Atenolol, propranolol, lisinopril, candesartan, HCTZ Past antidepressant medications:  Venlafaxine, amitriptyline, nortriptyline, Cumbalta, Pristiq, Wellbutrin Past anticonvulsant medications:  Topiramate, Qudexy, zonisamide, Depakote, gabapentin Past anti-CGRP:  Aimovig, Ajovy, Emgality Past vitamins/Herbal/Supplements:  Feverfew Past antihistamines/decongestants:  none Other past therapies:  greater/lesser occipital nerve blocks/auriculotemporal nerve block/supraorbital nerve block, memantine  Caffeine:  No coffee.  1 Coke a day Diet:  Drinks 24 to 36 oz water daily Depression:  yes; Anxiety:  yes Other pain:  No Sleep hygiene:  better Family history of headache:  Mom had migraines when younger   PAST MEDICAL HISTORY: Past Medical History:  Diagnosis Date  . Headache(784.0)    migraines  . Hypothyroidism    . Migraines     PAST SURGICAL HISTORY: Past Surgical History:  Procedure Laterality Date  . TUBAL LIGATION    .  uterine ablation    . uterine polyps      MEDICATIONS: Current Outpatient Medications on File Prior to Visit  Medication Sig Dispense Refill  . ALPRAZolam (XANAX) 1 MG tablet Take 1 mg by mouth at bedtime.  5  . amitriptyline (ELAVIL) 50 MG tablet Take 3 tablets (150 mg total) by mouth at bedtime. 90 tablet 11  . butalbital-acetaminophen-caffeine (FIORICET, ESGIC) 50-325-40 MG tablet TAKE (1) TABLET BY MOUTH EVERY SIX HOURS AS NEEDED. Max 10 per month. (Patient taking differently: Take 1 tablet by mouth every 6 (six) hours as needed for headache or migraine. . Max 10 per month.) 10 tablet 4  . HYDROcodone-acetaminophen (NORCO) 10-325 MG tablet Take 1 tablet by mouth every 6 (six) hours as needed for moderate pain or severe pain.   0  . levothyroxine (SYNTHROID, LEVOTHROID) 150 MCG tablet Take 150 mcg by mouth daily before breakfast.    . lidocaine (LIDODERM) 5 % Place 1 patch onto the skin daily. Remove & Discard patch within 12 hours or as directed by MD 15 patch 0  . Multiple Vitamin (MULTIVITAMIN WITH MINERALS) TABS tablet Take 1 tablet by mouth at bedtime.    . ondansetron (ZOFRAN ODT) 4 MG disintegrating tablet Take 1-2 tablets (4 mg total) by mouth every 8 (eight) hours as needed (Patient taking differently: Take 4-8 mg by mouth every 8 (eight) hours as needed for nausea or vomiting. Take 1-2 tablets (4 mg total) by mouth every 8 (eight) hours as needed) 30 tablet 12  . oxyCODONE-acetaminophen (PERCOCET/ROXICET) 5-325 MG tablet Take 1 tablet by mouth every 4 (four) hours as needed. 15 tablet 0  . prochlorperazine (COMPAZINE) 10 MG tablet Take 1 tablet (10 mg total) by mouth every 8 (eight) hours as needed for nausea or vomiting (Nausea). 45 tablet 6  . SUMAtriptan (IMITREX) 100 MG tablet TAKE 1 TABLET BY MOUTH EVERY 2 HOURS AS NEEDED FOR MIGRAINE. MAX 2 DOSES PER DAY.  (Patient taking differently: Take 100 mg by mouth every 2 (two) hours as needed for migraine or headache. MAX 2 DOSES PER DAY.) 10 tablet 11  . SUMAtriptan 6 MG/0.5ML SOAJ INJECT 6MG  UNDER THE SKIN, MAY REPEAT IN 2 HOURS. MAX OF 2 IN 24 HOURS. (Patient taking differently: Inject 6 mg into the skin as needed (for migraine pain. *Max of 2 doses within 24 hours). ) 5 mL 5  . tiZANidine (ZANAFLEX) 4 MG tablet TAKE ONE TABLET BY MOUTH EVERY 6 HOURS AS NEEDED FOR MUSCLE SPASMS. (Patient taking differently: Take 4 mg by mouth every 6 (six) hours as needed for muscle spasms. ) 90 tablet 0  . tiZANidine (ZANAFLEX) 4 MG tablet TAKE ONE TABLET BY MOUTH EVERY 6 HOURS AS NEEDED FOR MUSCLE SPASMS. (Patient not taking: Reported on 10/16/2017) 90 tablet 0  . valACYclovir (VALTREX) 1000 MG tablet Take 1 tablet (1,000 mg total) by mouth 2 (two) times daily. 20 tablet 0  . vitamin C (ASCORBIC ACID) 500 MG tablet Take 500 mg by mouth at bedtime.     No current facility-administered medications on file prior to visit.     ALLERGIES: Allergies  Allergen Reactions  . Aimovig [Erenumab] Other (See Comments)    Burning in the back of the head, severe migraine  . Compazine [Prochlorperazine] Other (See Comments)    IV form, can take PO  . Emgality [Galcanezumab-Gnlm] Other (See Comments)    Ineffective     FAMILY HISTORY: Family History  Problem Relation Age of Onset  . Migraines  Mother        benign small bowel tumor  . Prostate cancer Father   . Migraines Other     SOCIAL HISTORY: Social History   Socioeconomic History  . Marital status: Legally Separated    Spouse name: Not on file  . Number of children: 1  . Years of education: 63  . Highest education level: Not on file  Occupational History  . Occupation: Nurse  Social Needs  . Financial resource strain: Not on file  . Food insecurity:    Worry: Not on file    Inability: Not on file  . Transportation needs:    Medical: Not on file     Non-medical: Not on file  Tobacco Use  . Smoking status: Never Smoker  . Smokeless tobacco: Never Used  Substance and Sexual Activity  . Alcohol use: Yes    Comment: social  . Drug use: No  . Sexual activity: Not on file  Lifestyle  . Physical activity:    Days per week: Not on file    Minutes per session: Not on file  . Stress: Not on file  Relationships  . Social connections:    Talks on phone: Not on file    Gets together: Not on file    Attends religious service: Not on file    Active member of club or organization: Not on file    Attends meetings of clubs or organizations: Not on file    Relationship status: Not on file  . Intimate partner violence:    Fear of current or ex partner: Not on file    Emotionally abused: Not on file    Physically abused: Not on file    Forced sexual activity: Not on file  Other Topics Concern  . Not on file  Social History Narrative   Lives with daughter   Caffeine use: rare    REVIEW OF SYSTEMS: Constitutional: No fevers, chills, or sweats, no generalized fatigue, change in appetite Eyes: No visual changes, double vision, eye pain Ear, nose and throat: No hearing loss, ear pain, nasal congestion, sore throat Cardiovascular: No chest pain, palpitations Respiratory:  No shortness of breath at rest or with exertion, wheezes GastrointestinaI: No nausea, vomiting, diarrhea, abdominal pain, fecal incontinence Genitourinary:  No dysuria, urinary retention or frequency Musculoskeletal:  Neck pain Integumentary: No rash, pruritus, skin lesions Neurological: as above Psychiatric: No depression, insomnia, anxiety Endocrine: No palpitations, fatigue, diaphoresis, mood swings, change in appetite, change in weight, increased thirst Hematologic/Lymphatic:  No purpura, petechiae. Allergic/Immunologic: no itchy/runny eyes, nasal congestion, recent allergic reactions, rashes  PHYSICAL EXAM: Blood pressure 110/68, pulse 88, height 5\' 7"  (1.702 m),  weight 149 lb (67.6 kg), SpO2 96 %. General: No acute distress.  Patient appears well-groomed.   Head:  Normocephalic/atraumatic Eyes:  fundi examined but not visualized Neck: supple, no paraspinal tenderness, full range of motion Back: paraspinal tenderness Heart: regular rate and rhythm Lungs: Clear to auscultation bilaterally. Vascular: No carotid bruits. Neurological Exam: Mental status: alert and oriented to person, place, and time, recent and remote memory intact, fund of knowledge intact, attention and concentration intact, speech fluent and not dysarthric, language intact. Cranial nerves: CN I: not tested CN II: pupils equal, round and reactive to light, visual fields intact CN III, IV, VI:  full range of motion, no nystagmus, no ptosis CN V: facial sensation intact CN VII: upper and lower face symmetric CN VIII: hearing intact CN IX, X: gag intact, uvula midline CN  XI: sternocleidomastoid and trapezius muscles intact CN XII: tongue midline Bulk & Tone: normal, no fasciculations. Motor:  5/5 throughout  Sensation:  temperature and vibration sensation intact. Deep Tendon Reflexes:  2+ throughout, toes downgoing.   Finger to nose testing:  Without dysmetria.   Heel to shin:  Without dysmetria.   Gait:  Normal station and stride.  Romberg negative.  IMPRESSION: Chronic migraine without aura, without status migrainosus, not intractable Bilateral occipital  neuralgia  PLAN: 1.  Due to polypharmacy, we will taper off of gabapentin 2.  She will continue lamotrigine 100mg  at bedtime and Botox for migraine prophylaxis.  She is also on amitriptyline 150mg  at bedtime to help with sleep. 3.  Continue Oxtellar XR 600mg  twice daily for occipital neuralgia.  Check BMP. 4.  Tizanidine as needed 5.  Sumatriptan 100mg  or 6mg  [place] for abortive therapy.  Use Fioricet sparingly (only 10 tablets a month) 6.  Limit use of pain relievers to no more than 2 days out of week to prevent risk of  rebound or medication-overuse headache. 7.  Keep headache diary 8.  Follow up for Botox and follow up in office in 4 months.  Thank you for allowing me to take part in the care of this patient.  Metta Clines, DO  CC: Sinda Du, MD

## 2018-01-08 ENCOUNTER — Ambulatory Visit: Payer: BLUE CROSS/BLUE SHIELD | Admitting: Neurology

## 2018-01-08 ENCOUNTER — Encounter: Payer: Self-pay | Admitting: Neurology

## 2018-01-08 VITALS — BP 110/68 | HR 88 | Ht 67.0 in | Wt 149.0 lb

## 2018-01-08 DIAGNOSIS — G43711 Chronic migraine without aura, intractable, with status migrainosus: Secondary | ICD-10-CM

## 2018-01-08 DIAGNOSIS — M5481 Occipital neuralgia: Secondary | ICD-10-CM | POA: Diagnosis not present

## 2018-01-08 MED ORDER — OXCARBAZEPINE ER 600 MG PO TB24: 600.0000 mg | ORAL_TABLET | Freq: Two times a day (BID) | ORAL | 0 refills | Status: DC

## 2018-01-08 NOTE — Patient Instructions (Addendum)
1.  Continue lamotrigine 100mg  at bedtime, Oxtellar XR 600mg  twice daily, amitriptyline 150mg  at bedtime, and tizanidine 4mg  at bedtime. 2.  Continue sumatriptan and Fioricet (use sparingly) when you get a migraine.  Limit use of pain relievers to no more than 2 days out of week to prevent risk of rebound or medication-overuse headache. 3.  We will taper off of gabapentin:  Take 3 pills at bedtime for 3 days, then 2 pills at bedtime for 3 days, then 1 pill at bedtime for 3 days, then stop. 4.  We will set up for Botox.

## 2018-01-17 ENCOUNTER — Encounter: Payer: Self-pay | Admitting: *Deleted

## 2018-01-17 NOTE — Progress Notes (Signed)
Submitted today via covermy meds key#[id number].

## 2018-01-22 NOTE — Progress Notes (Signed)
Rcvd approval from [facility] GXE#[id number] 01/17/2018 - 01/17/2019  4 visits

## 2018-02-14 ENCOUNTER — Emergency Department (HOSPITAL_COMMUNITY)
Admission: EM | Admit: 2018-02-14 | Discharge: 2018-02-15 | Disposition: A | Discharge status: Home / Self Care | Payer: BLUE CROSS/BLUE SHIELD | Source: Home / Self Care | Attending: Emergency Medicine | Admitting: Emergency Medicine

## 2018-02-14 ENCOUNTER — Encounter (HOSPITAL_COMMUNITY): Payer: Self-pay | Admitting: Emergency Medicine

## 2018-02-14 ENCOUNTER — Other Ambulatory Visit: Payer: Self-pay

## 2018-02-14 ENCOUNTER — Emergency Department (HOSPITAL_COMMUNITY)
Admission: EM | Admit: 2018-02-14 | Discharge: 2018-02-14 | Disposition: A | Discharge status: Home / Self Care | Payer: BLUE CROSS/BLUE SHIELD | Attending: Emergency Medicine | Admitting: Emergency Medicine

## 2018-02-14 DIAGNOSIS — Z79899 Other long term (current) drug therapy: Secondary | ICD-10-CM

## 2018-02-14 DIAGNOSIS — R519 Headache, unspecified: Secondary | ICD-10-CM

## 2018-02-14 DIAGNOSIS — E039 Hypothyroidism, unspecified: Secondary | ICD-10-CM

## 2018-02-14 DIAGNOSIS — G43909 Migraine, unspecified, not intractable, without status migrainosus: Secondary | ICD-10-CM | POA: Diagnosis not present

## 2018-02-14 DIAGNOSIS — I10 Essential (primary) hypertension: Secondary | ICD-10-CM | POA: Diagnosis not present

## 2018-02-14 DIAGNOSIS — M5481 Occipital neuralgia: Secondary | ICD-10-CM

## 2018-02-14 DIAGNOSIS — G8929 Other chronic pain: Secondary | ICD-10-CM

## 2018-02-14 DIAGNOSIS — R51 Headache: Secondary | ICD-10-CM | POA: Diagnosis not present

## 2018-02-14 DIAGNOSIS — R112 Nausea with vomiting, unspecified: Secondary | ICD-10-CM | POA: Diagnosis not present

## 2018-02-14 HISTORY — DX: Occipital neuralgia: M54.81

## 2018-02-14 LAB — BASIC METABOLIC PANEL
Anion gap: 9 (ref 5–15)
BUN: 10 mg/dL (ref 6–20)
CHLORIDE: 106 mmol/L (ref 98–111)
CO2: 27 mmol/L (ref 22–32)
Calcium: 8.8 mg/dL — ABNORMAL LOW (ref 8.9–10.3)
Creatinine, Ser: 0.9 mg/dL (ref 0.44–1.00)
GFR calc non Af Amer: >60 mL/min (ref >60)
Glucose, Bld: 121 mg/dL — ABNORMAL HIGH (ref 70–99)
Potassium: 3.6 mmol/L (ref 3.5–5.1)
SODIUM: 142 mmol/L (ref 135–145)

## 2018-02-14 LAB — CBC WITH DIFFERENTIAL/PLATELET
Abs Immature Granulocytes: 0.01 10*3/uL (ref 0.00–0.07)
BASOS ABS: 0 10*3/uL (ref 0.0–0.1)
Basophils Relative: 1 %
Eosinophils Absolute: 0.3 10*3/uL (ref 0.0–0.5)
Eosinophils Relative: 5 %
HEMATOCRIT: 43.3 % (ref 36.0–46.0)
HEMOGLOBIN: 14 g/dL (ref 12.0–15.0)
Immature Granulocytes: 0 %
LYMPHS ABS: 0.9 10*3/uL (ref 0.7–4.0)
LYMPHS PCT: 20 %
MCH: 31.1 pg (ref 26.0–34.0)
MCHC: 32.3 g/dL (ref 30.0–36.0)
MCV: 96.2 fL (ref 80.0–100.0)
Monocytes Absolute: 0.5 10*3/uL (ref 0.1–1.0)
Monocytes Relative: 10 %
NEUTROS PCT: 64 %
NRBC: 0 % (ref 0.0–0.2)
Neutro Abs: 3 10*3/uL (ref 1.7–7.7)
Platelets: 270 10*3/uL (ref 150–400)
RBC: 4.5 MIL/uL (ref 3.87–5.11)
RDW: 12.8 % (ref 11.5–15.5)
WBC: 4.7 10*3/uL (ref 4.0–10.5)

## 2018-02-14 MED ORDER — ONDANSETRON HCL 4 MG/2ML IJ SOLN
4.0000 mg | Freq: Once | INTRAMUSCULAR | Status: AC
  Administered 2018-02-14: 4 mg via INTRAVENOUS
  Filled 2018-02-14: qty 2

## 2018-02-14 MED ORDER — HYDROMORPHONE HCL 1 MG/ML IJ SOLN
1.0000 mg | Freq: Once | INTRAMUSCULAR | Status: AC
  Administered 2018-02-14: 1 mg via INTRAVENOUS
  Filled 2018-02-14: qty 1

## 2018-02-14 MED ORDER — SODIUM CHLORIDE 0.9 % IV BOLUS
1000.0000 mL | Freq: Once | INTRAVENOUS | Status: AC
  Administered 2018-02-14: 1000 mL via INTRAVENOUS

## 2018-02-14 MED ORDER — PROMETHAZINE HCL 12.5 MG PO TABS
25.0000 mg | ORAL_TABLET | Freq: Once | ORAL | Status: AC
  Administered 2018-02-14: 25 mg via ORAL
  Filled 2018-02-14: qty 2

## 2018-02-14 MED ORDER — OXYCODONE-ACETAMINOPHEN 5-325 MG PO TABS: 1.0000 | ORAL_TABLET | Freq: Four times a day (QID) | ORAL | 0 refills | Status: DC | PRN

## 2018-02-14 NOTE — ED Triage Notes (Signed)
Pt C/O headache that began Sunday. Pt states that she was diagnosed with occipital neuralgia in August. Pt states she is here for pain control and nausea.

## 2018-02-14 NOTE — ED Triage Notes (Signed)
Pt states has "occipital neuralgia." pt was seen here last night for same. Pt states she did not get per percocet filled last night. Pt states she is nauseated and can only take IV narcotics for pain.

## 2018-02-14 NOTE — ED Notes (Addendum)
Pt ambulatory to bathroom and back to room with family assistance. Slow steady gait noted.

## 2018-02-14 NOTE — ED Provider Notes (Signed)
[facility] EMERGENCY DEPARTMENT Provider Note   CSN: [id number] Arrival date & time: 02/14/18  0422     History   Chief Complaint Chief Complaint  Patient presents with  . Headache    [name] is a 48 y.o. female.  Patient is a 48 year old female with history of migraine headaches.  She was recently diagnosed with occipital neuralgia.  She has been seen by multiple neurologists and actually had occipital nerve decompression surgery performed in [place].  She presents today stating that her pain has become much worse and is unbearable.  She describes throbbing pain to the back of her head with associated nausea.  She has had decreased p.o. intake.  She denies any weakness or numbness.  She denies any visual disturbances.  The history is provided by the patient.  Headache  Pain location:  Occipital Quality:  Stabbing Radiates to:  Does not radiate Timing:  Constant Chronicity:  Chronic Context: bright light   Relieved by:  Nothing Worsened by:  Nothing Ineffective treatments:  None tried   Past Medical History:  Diagnosis Date  . Headache(784.0)    migraines  . Hypothyroidism   . Migraines     Patient Active Problem List   Diagnosis Date Noted  . Chronic migraine w/o aura w/o status migrainosus, not intractable 08/22/2015  . Abdominal pain, epigastric 07/03/2013  . Acute kidney injury ([place]) 10/20/2011  . Nausea & vomiting 10/19/2011  . Suprapubic abdominal pain 10/19/2011  . Dehydration 10/19/2011  . Hypothyroidism 10/19/2011  . Migraines 10/19/2011    Past Surgical History:  Procedure Laterality Date  . TUBAL LIGATION    . uterine ablation    . uterine polyps       OB History    Gravida      Para      Term      Preterm      AB      Living  0     SAB      TAB      Ectopic      Multiple      Live Births               Home Medications    Prior to Admission medications   Medication Sig Start Date End Date  Taking? Authorizing Provider  ALPRAZolam Duanne Moron) 1 MG tablet Take 1 mg by mouth at bedtime. 05/01/16   [provider]  amitriptyline (ELAVIL) 50 MG tablet Take 3 tablets (150 mg total) by mouth at bedtime. 07/16/17   Melvenia Beam, MD  butalbital-acetaminophen-caffeine (FIORICET, ESGIC) 50-325-40 MG tablet TAKE (1) TABLET BY MOUTH EVERY SIX HOURS AS NEEDED. Max 10 per month. Patient taking differently: Take 1 tablet by mouth every 6 (six) hours as needed for headache or migraine. . Max 10 per month. 09/18/17   Melvenia Beam, MD  HYDROcodone-acetaminophen (NORCO) 10-325 MG tablet Take 1 tablet by mouth every 6 (six) hours as needed for moderate pain or severe pain.  09/18/17   [provider]  levothyroxine (SYNTHROID, LEVOTHROID) 150 MCG tablet Take 150 mcg by mouth daily before breakfast.    [provider]  lidocaine (LIDODERM) 5 % Place 1 patch onto the skin daily. Remove & Discard patch within 12 hours or as directed by MD 10/17/17   Evalee Jefferson, PA-C  Multiple Vitamin (MULTIVITAMIN WITH MINERALS) TABS tablet Take 1 tablet by mouth at bedtime.    [provider]  ondansetron (ZOFRAN ODT)  4 MG disintegrating tablet Take 1-2 tablets (4 mg total) by mouth every 8 (eight) hours as needed Patient taking differently: Take 4-8 mg by mouth every 8 (eight) hours as needed for nausea or vomiting. Take 1-2 tablets (4 mg total) by mouth every 8 (eight) hours as needed 08/18/15   Melvenia Beam, MD  OXcarbazepine ER (OXTELLAR XR) 600 MG TB24 Take 600 mg by mouth 2 (two) times daily.    [provider]  OXcarbazepine ER 600 MG TB24 Take 600 mg by mouth 2 (two) times daily. 01/08/18   Pieter Partridge, DO  oxyCODONE-acetaminophen (PERCOCET/ROXICET) 5-325 MG tablet Take 1 tablet by mouth every 4 (four) hours as needed. 10/17/17   Evalee Jefferson, PA-C  prochlorperazine (COMPAZINE) 10 MG tablet Take 1 tablet (10 mg total) by mouth every 8 (eight) hours as needed for nausea or  vomiting (Nausea). 09/19/16   Melvenia Beam, MD  SUMAtriptan (IMITREX) 100 MG tablet TAKE 1 TABLET BY MOUTH EVERY 2 HOURS AS NEEDED FOR MIGRAINE. MAX 2 DOSES PER DAY. Patient taking differently: Take 100 mg by mouth every 2 (two) hours as needed for migraine or headache. MAX 2 DOSES PER DAY. 05/10/17   Melvenia Beam, MD  SUMAtriptan 6 MG/0.5ML SOAJ INJECT 6MG  UNDER THE SKIN, MAY REPEAT IN 2 HOURS. MAX OF 2 IN 24 HOURS. Patient taking differently: Inject 6 mg into the skin as needed (for migraine pain. *Max of 2 doses within 24 hours).  09/07/17   Melvenia Beam, MD  tiZANidine (ZANAFLEX) 4 MG tablet TAKE ONE TABLET BY MOUTH EVERY 6 HOURS AS NEEDED FOR MUSCLE SPASMS. Patient taking differently: Take 4 mg by mouth every 6 (six) hours as needed for muscle spasms.  09/24/17   Melvenia Beam, MD  tiZANidine (ZANAFLEX) 4 MG tablet TAKE ONE TABLET BY MOUTH EVERY 6 HOURS AS NEEDED FOR MUSCLE SPASMS. Patient not taking: Reported on 10/16/2017 10/16/17   Melvenia Beam, MD  valACYclovir (VALTREX) 1000 MG tablet Take 1 tablet (1,000 mg total) by mouth 2 (two) times daily. 05/09/17   Cresenzo-Dishmon, Joaquim Lai, CNM  vitamin C (ASCORBIC ACID) 500 MG tablet Take 500 mg by mouth at bedtime.    [provider]    Family History Family History  Problem Relation Age of Onset  . Migraines Mother        benign small bowel tumor  . Prostate cancer Father   . Seizures Brother        MVA  . Migraines Other     Social History Social History   Tobacco Use  . Smoking status: Never Smoker  . Smokeless tobacco: Never Used  Substance Use Topics  . Alcohol use: Yes    Comment: social  . Drug use: No     Allergies   Aimovig [erenumab]; Compazine [prochlorperazine]; and Emgality [galcanezumab-gnlm]   Review of Systems Review of Systems  Neurological: Positive for headaches.  All other systems reviewed and are negative.    Physical Exam Updated Vital Signs BP 113/84 (BP Location: Right  Arm)   Pulse 97   Temp 97.9 F (36.6 C) (Oral)   Resp 15   SpO2 96%   Physical Exam Vitals signs and nursing note reviewed.  Constitutional:      General: She is not in acute distress.    Appearance: She is well-developed. She is not diaphoretic.  HENT:     Head: Normocephalic and atraumatic.  Eyes:     General: No visual field  deficit.    Extraocular Movements: Extraocular movements intact.     Pupils: Pupils are equal, round, and reactive to light.  Neck:     Musculoskeletal: Normal range of motion and neck supple.  Cardiovascular:     Rate and Rhythm: Normal rate and regular rhythm.     Heart sounds: No murmur. No friction rub. No gallop.   Pulmonary:     Effort: Pulmonary effort is normal. No respiratory distress.     Breath sounds: Normal breath sounds. No wheezing.  Abdominal:     General: Bowel sounds are normal. There is no distension.     Palpations: Abdomen is soft.     Tenderness: There is no abdominal tenderness.  Musculoskeletal: Normal range of motion.  Skin:    General: Skin is warm and dry.  Neurological:     Mental Status: She is alert and oriented to person, place, and time.     Cranial Nerves: No cranial nerve deficit or dysarthria.     Sensory: No sensory deficit.     Motor: No weakness.     Coordination: Coordination normal.      ED Treatments / Results  Labs (all labs ordered are listed, but only abnormal results are displayed) Labs Reviewed  BASIC METABOLIC PANEL  CBC WITH DIFFERENTIAL/PLATELET    EKG None  Radiology No results found.  Procedures Procedures (including critical care time)  Medications Ordered in ED Medications  sodium chloride 0.9 % bolus 1,000 mL (has no administration in time range)  HYDROmorphone (DILAUDID) injection 1 mg (has no administration in time range)  ondansetron (ZOFRAN) injection 4 mg (has no administration in time range)     Initial Impression / Assessment and Plan / ED Course  I have reviewed  the triage vital signs and the nursing notes.  Pertinent labs & imaging results that were available during my care of the patient were reviewed by me and considered in my medical decision making (see chart for details).  Patient presenting here with complaints of head pain related to what she has been diagnosed with as occipital neuralgia.  She was given pain medicine here and is feeling better.  Her laboratory studies are unremarkable.  Patient has a chronic condition for which she needs to speak with her primary doctor for possible referrals to pain management.  Final Clinical Impressions(s) / ED Diagnoses   Final diagnoses:  None    ED Discharge Orders    None       Veryl Speak, MD 02/14/18 [phone]

## 2018-02-14 NOTE — ED Provider Notes (Signed)
[facility] EMERGENCY DEPARTMENT Provider Note   CSN: [id number] Arrival date & time: 02/14/18  2151     History   Chief Complaint Chief Complaint  Patient presents with  . Headache    [name] is a 48 y.o. female.  HPI   Martha Aguilar is a 48 y.o. female with hx of chronic headaches and bilateral occipital neuralgia diagnosed last year,  presents to the Emergency Department complaining of constant posterior headache that is similar to previous headaches.  She was seen here last evening for the same.  She said the headache briefly improved while here in the ER, but did not resolve.  She also reports persistent vomiting since Sunday.  She has been seen by 6 different neurologists and currently sees a neurologist in [place] who has treated her with multiple medications which have not provided relief or caused a rash.  She had an appointment with her neurologist today, but had to cancel her appointment because she states she was hurting too bad to go.  She is also been seen in [place] and had decompression surgery of the occipital nerve which provided left-sided relief but unsuccessful for the right side.  Tonight, she describes constant throbbing pain to the right side of her occipital area and continued nausea and vomiting.  She has had decreased p.o. intake and photophobia.  She denies fever, chills, abdominal pain, numbness or weakness. She was given rx for percocet last evening but unable to get medication filled.    Past Medical History:  Diagnosis Date  . Headache(784.0)    migraines  . Hypothyroidism   . Migraines   . Occipital neuralgia     Patient Active Problem List   Diagnosis Date Noted  . Chronic migraine w/o aura w/o status migrainosus, not intractable 08/22/2015  . Abdominal pain, epigastric 07/03/2013  . Acute kidney injury ([place]) 10/20/2011  . Nausea & vomiting 10/19/2011  . Suprapubic abdominal pain 10/19/2011  . Dehydration 10/19/2011    . Hypothyroidism 10/19/2011  . Migraines 10/19/2011    Past Surgical History:  Procedure Laterality Date  . TUBAL LIGATION    . uterine ablation    . uterine polyps       OB History    Gravida      Para      Term      Preterm      AB      Living  0     SAB      TAB      Ectopic      Multiple      Live Births               Home Medications    Prior to Admission medications   Medication Sig Start Date End Date Taking? Authorizing Provider  ALPRAZolam Duanne Moron) 1 MG tablet Take 1 mg by mouth at bedtime. 05/01/16   [provider]  amitriptyline (ELAVIL) 50 MG tablet Take 3 tablets (150 mg total) by mouth at bedtime. 07/16/17   Melvenia Beam, MD  butalbital-acetaminophen-caffeine (FIORICET, ESGIC) 50-325-40 MG tablet TAKE (1) TABLET BY MOUTH EVERY SIX HOURS AS NEEDED. Max 10 per month. Patient taking differently: Take 1 tablet by mouth every 6 (six) hours as needed for headache or migraine. . Max 10 per month. 09/18/17   Melvenia Beam, MD  HYDROcodone-acetaminophen (NORCO) 10-325 MG tablet Take 1 tablet by mouth every 6 (six) hours as needed for moderate pain or severe  pain.  09/18/17   [provider]  levothyroxine (SYNTHROID, LEVOTHROID) 150 MCG tablet Take 150 mcg by mouth daily before breakfast.    [provider]  lidocaine (LIDODERM) 5 % Place 1 patch onto the skin daily. Remove & Discard patch within 12 hours or as directed by MD 10/17/17   Evalee Jefferson, PA-C  Multiple Vitamin (MULTIVITAMIN WITH MINERALS) TABS tablet Take 1 tablet by mouth at bedtime.    [provider]  ondansetron (ZOFRAN ODT) 4 MG disintegrating tablet Take 1-2 tablets (4 mg total) by mouth every 8 (eight) hours as needed Patient taking differently: Take 4-8 mg by mouth every 8 (eight) hours as needed for nausea or vomiting. Take 1-2 tablets (4 mg total) by mouth every 8 (eight) hours as needed 08/18/15   Melvenia Beam, MD  OXcarbazepine ER (OXTELLAR  XR) 600 MG TB24 Take 600 mg by mouth 2 (two) times daily.    [provider]  OXcarbazepine ER 600 MG TB24 Take 600 mg by mouth 2 (two) times daily. 01/08/18   Pieter Partridge, DO  oxyCODONE-acetaminophen (PERCOCET) 5-325 MG tablet Take 1-2 tablets by mouth every 6 (six) hours as needed. 02/14/18   Veryl Speak, MD  prochlorperazine (COMPAZINE) 10 MG tablet Take 1 tablet (10 mg total) by mouth every 8 (eight) hours as needed for nausea or vomiting (Nausea). 09/19/16   Melvenia Beam, MD  SUMAtriptan (IMITREX) 100 MG tablet TAKE 1 TABLET BY MOUTH EVERY 2 HOURS AS NEEDED FOR MIGRAINE. MAX 2 DOSES PER DAY. Patient taking differently: Take 100 mg by mouth every 2 (two) hours as needed for migraine or headache. MAX 2 DOSES PER DAY. 05/10/17   Melvenia Beam, MD  SUMAtriptan 6 MG/0.5ML SOAJ INJECT 6MG  UNDER THE SKIN, MAY REPEAT IN 2 HOURS. MAX OF 2 IN 24 HOURS. Patient taking differently: Inject 6 mg into the skin as needed (for migraine pain. *Max of 2 doses within 24 hours).  09/07/17   Melvenia Beam, MD  tiZANidine (ZANAFLEX) 4 MG tablet TAKE ONE TABLET BY MOUTH EVERY 6 HOURS AS NEEDED FOR MUSCLE SPASMS. Patient taking differently: Take 4 mg by mouth every 6 (six) hours as needed for muscle spasms.  09/24/17   Melvenia Beam, MD  tiZANidine (ZANAFLEX) 4 MG tablet TAKE ONE TABLET BY MOUTH EVERY 6 HOURS AS NEEDED FOR MUSCLE SPASMS. Patient not taking: Reported on 10/16/2017 10/16/17   Melvenia Beam, MD  valACYclovir (VALTREX) 1000 MG tablet Take 1 tablet (1,000 mg total) by mouth 2 (two) times daily. 05/09/17   Cresenzo-Dishmon, Joaquim Lai, CNM  vitamin C (ASCORBIC ACID) 500 MG tablet Take 500 mg by mouth at bedtime.    [provider]    Family History Family History  Problem Relation Age of Onset  . Migraines Mother        benign small bowel tumor  . Prostate cancer Father   . Seizures Brother        MVA  . Migraines Other     Social History Social History   Tobacco Use  .  Smoking status: Never Smoker  . Smokeless tobacco: Never Used  Substance Use Topics  . Alcohol use: Yes    Comment: social  . Drug use: No     Allergies   Aimovig [erenumab]; Compazine [prochlorperazine]; and Emgality [galcanezumab-gnlm]   Review of Systems Review of Systems  Constitutional: Negative for activity change, appetite change and fever.  HENT: Negative for facial swelling and trouble  swallowing.   Eyes: Positive for photophobia. Negative for pain and visual disturbance.  Respiratory: Negative for shortness of breath.   Cardiovascular: Negative for chest pain.  Gastrointestinal: Positive for nausea and vomiting. Negative for abdominal pain.  Musculoskeletal: Negative for neck pain and neck stiffness.  Skin: Negative for rash and wound.  Neurological: Positive for headaches. Negative for dizziness, facial asymmetry, speech difficulty, weakness and numbness.  Psychiatric/Behavioral: Negative for confusion and decreased concentration.     Physical Exam Updated Vital Signs BP (!) 186/98   Pulse (!) 105   Temp 98.1 F (36.7 C) (Oral)   Resp 18   SpO2 100%   Physical Exam Vitals signs and nursing note reviewed.  Constitutional:      General: She is not in acute distress.    Appearance: She is well-developed.     Comments: Patient is tearful and anxious appearing.  HENT:     Head: Atraumatic.     Mouth/Throat:     Mouth: Mucous membranes are moist.  Eyes:     Extraocular Movements: Extraocular movements intact.     Conjunctiva/sclera: Conjunctivae normal.     Pupils: Pupils are equal, round, and reactive to light.  Neck:     Musculoskeletal: Normal range of motion and neck supple. No neck rigidity, spinous process tenderness or muscular tenderness.     Trachea: Phonation normal.     Meningeal: Kernig's sign absent.  Cardiovascular:     Rate and Rhythm: Normal rate and regular rhythm.  Pulmonary:     Effort: Pulmonary effort is normal. No respiratory  distress.     Breath sounds: Normal breath sounds.  Abdominal:     Palpations: Abdomen is soft.     Tenderness: There is no abdominal tenderness.  Musculoskeletal: Normal range of motion.  Skin:    General: Skin is warm and dry.     Capillary Refill: Capillary refill takes less than 2 seconds.     Findings: No rash.  Neurological:     Mental Status: She is alert. Mental status is at baseline.     GCS: GCS eye subscore is 4. GCS verbal subscore is 5. GCS motor subscore is 6.     Cranial Nerves: No cranial nerve deficit.     Sensory: No sensory deficit.     Motor: No weakness or abnormal muscle tone.     Coordination: Coordination normal.     Gait: Gait normal.     Deep Tendon Reflexes:     Reflex Scores:      Tricep reflexes are 2+ on the right side and 2+ on the left side.      Bicep reflexes are 2+ on the right side and 2+ on the left side.    Comments: CN III-XII grossly intact.  Speech clear.  No pronator drift.  No motor weakness.  Psychiatric:        Mood and Affect: Mood is anxious.        Thought Content: Thought content normal.      ED Treatments / Results  Labs (all labs ordered are listed, but only abnormal results are displayed) Labs Reviewed - No data to display  EKG None  Radiology No results found.  Procedures Procedures (including critical care time)  Medications Ordered in ED Medications  sodium chloride 0.9 % bolus 1,000 mL (1,000 mLs Intravenous New Bag/Given 02/14/18 2252)  HYDROmorphone (DILAUDID) injection 1 mg (has no administration in time range)  ondansetron (ZOFRAN) injection 4 mg (has no administration  in time range)     Initial Impression / Assessment and Plan / ED Course  I have reviewed the triage vital signs and the nursing notes.  Pertinent labs & imaging results that were available during my care of the patient were reviewed by me and considered in my medical decision making (see chart for details).    Patient with chronic  headaches and history of occipital neuralgia.  On exam, she is spitting saliva into an emesis bag.  Mucous membranes are moist.  No focal neuro deficits on exam, no nuchal rigidity, no new or changing sx's to suggest need for imaging or LP.    Laboratory studies from last evening were reviewed by me  and within normal limits.  She has ambulated to the restroom w/o difficulty.  Rating headache at "6" after medications.  Stated she vomited up the phenergan tablet, but no pill fragments seen in the emesis bag or her mouth and again, just saliva in the bag.    Final Clinical Impressions(s) / ED Diagnoses   Final diagnoses:  Occipital neuralgia of right side  Chronic nonintractable headache, unspecified headache type    ED Discharge Orders    None       Kem Parkinson, PA-C 02/15/18 0041    Hayden Rasmussen, MD 02/15/18 1048

## 2018-02-14 NOTE — Discharge Instructions (Signed)
Percocet as prescribed as needed for pain.  Follow-up with your primary doctor to discuss pain management.

## 2018-02-14 NOTE — ED Notes (Signed)
ED Provider at bedside. Triplett, Garrard

## 2018-02-15 MED ORDER — PROMETHAZINE HCL 25 MG RE SUPP: 25.0000 mg | Freq: Four times a day (QID) | RECTAL | 0 refills | Status: DC | PRN

## 2018-02-15 MED ORDER — DEXAMETHASONE SODIUM PHOSPHATE 4 MG/ML IJ SOLN
10.0000 mg | Freq: Once | INTRAMUSCULAR | Status: AC
  Administered 2018-02-15: 10 mg via INTRAVENOUS
  Filled 2018-02-15: qty 3

## 2018-02-15 MED ORDER — KETOROLAC TROMETHAMINE 30 MG/ML IJ SOLN
30.0000 mg | Freq: Once | INTRAMUSCULAR | Status: AC
  Administered 2018-02-15: 30 mg via INTRAVENOUS
  Filled 2018-02-15: qty 1

## 2018-02-15 NOTE — ED Notes (Signed)
No vomiting since arrival.

## 2018-02-15 NOTE — Discharge Instructions (Signed)
Follow-up with your neurologist tomorrow. 

## 2018-02-23 ENCOUNTER — Other Ambulatory Visit: Payer: Self-pay | Admitting: Neurology

## 2018-02-25 ENCOUNTER — Telehealth: Payer: Self-pay | Admitting: Neurology

## 2018-02-25 NOTE — Telephone Encounter (Signed)
Martha Aguilar, please let patient know that since I am no longer treating her and she is in the care of other neurologists she will have to get all her medications refilled from her treating neurologists. I recently received a refill request for fioricet which I declined for the above reason. thanks

## 2018-02-26 ENCOUNTER — Encounter: Payer: Self-pay | Admitting: *Deleted

## 2018-02-26 NOTE — Telephone Encounter (Signed)
I emailed the patient in [place].

## 2018-02-27 DIAGNOSIS — M5481 Occipital neuralgia: Secondary | ICD-10-CM | POA: Diagnosis not present

## 2018-02-27 DIAGNOSIS — Z79899 Other long term (current) drug therapy: Secondary | ICD-10-CM | POA: Diagnosis not present

## 2018-02-27 DIAGNOSIS — G43709 Chronic migraine without aura, not intractable, without status migrainosus: Secondary | ICD-10-CM | POA: Diagnosis not present

## 2018-02-27 DIAGNOSIS — H53149 Visual discomfort, unspecified: Secondary | ICD-10-CM | POA: Diagnosis not present

## 2018-03-08 ENCOUNTER — Telehealth: Payer: Self-pay | Admitting: Neurology

## 2018-03-08 ENCOUNTER — Ambulatory Visit (INDEPENDENT_AMBULATORY_CARE_PROVIDER_SITE_OTHER): Payer: BLUE CROSS/BLUE SHIELD | Admitting: Neurology

## 2018-03-08 DIAGNOSIS — G43709 Chronic migraine without aura, not intractable, without status migrainosus: Secondary | ICD-10-CM | POA: Diagnosis not present

## 2018-03-08 MED ORDER — ONABOTULINUMTOXINA 100 UNITS IJ SOLR
155.0000 [IU] | Freq: Once | INTRAMUSCULAR | Status: AC
  Administered 2018-03-08: 155 [IU] via INTRAMUSCULAR

## 2018-03-08 MED ORDER — OXCARBAZEPINE 150 MG PO TABS: ORAL_TABLET | ORAL | 0 refills | Status: DC

## 2018-03-08 NOTE — Progress Notes (Signed)
[facility]   Procedure Note Botox  Attending: Dr. Tomi Likens  Preoperative Diagnosis(es): Chronic migraine  Consent obtained from: Patient Benefits discussed included, but were not limited to decreased muscle tightness, increased joint range of motion, and decreased pain.  Risk discussed included, but were not limited pain and discomfort, bleeding, bruising, excessive weakness, venous thrombosis, muscle atrophy and dysphagia.  Anticipated outcomes of the procedure as well as he risks and benefits of the alternatives to the procedure, and the roles and tasks of the personnel to be involved, were discussed with the patient, and the patient consents to the procedure and agrees to proceed. A copy of the patient medication guide was given to the patient which explains the blackbox warning.  Patients identity and treatment sites confirmed:  Yes.  Details of Procedure: Skin was cleaned with alcohol. Prior to injection, the needle plunger was aspirated to make sure the needle was not within a blood vessel.  There was no blood retrieved on aspiration.    Following is a summary of the muscles injected  And the amount of Botulinum toxin used:  Dilution 200 units of Botox was reconstituted with 4 ml of preservative free normal saline. Time of reconstitution: At the time of the office visit (<30 minutes prior to injection)   Injections  155 total units of Botox was injected with a 30 gauge needle.  Injection Sites: L occipitalis: 15 units- 3 sites  R occiptalis: 15 units- 3 sites  L upper trapezius: 15 units- 3 sites R upper trapezius: 15 units- 3 sits          L paraspinal: 10 units- 2 sites R paraspinal: 10 units- 2 sites  Face L frontalis(2 injection sites):10 units   R frontalis(2 injection sites):10 units         L corrugator: 5 units   R corrugator: 5 units           Procerus: 5 units   L temporalis: 20 units R temporalis: 20 units   Agent:  200 units of botulinum Type A  (Onobotulinum Toxin type A) was reconstituted with 4 ml of preservative free normal saline.  Time of reconstitution: At the time of the office visit (<30 minutes prior to injection)     Total injected (Units): 155  Total wasted (Units): No waste  Patient tolerated procedure well without complications.   Reinjection is anticipated in 3 months. Return to clinic in 4 months

## 2018-03-08 NOTE — Telephone Encounter (Signed)
Patient on Dilantin 100mg  three times daily  Tegretol 100mg  three times daily  Lyrica 100mg  three times daily  Baclofen 10mg  three times daily  She had a rash on Oxtellar XR:  We will instead try IR oxcarbazepine.  1.  First, I will taper her off of Tegretol:  100mg  twice daily for 3 days, then once daily for 3 days, then STOP. 2.  Once she is off of Tegretol, she will begin oxcarbazepine 150mg  twice daily for 7 days, then increase to 300mg  twice daily.   3.  Once she starts the oxcarbazepine, she will taper off of Dilantin:  100mg  twice daily for 3 days, then once daily for 3 days, then STOP.

## 2018-03-08 NOTE — Addendum Note (Signed)
Addended by: Clois Comber on: 03/08/2018 03:43 PM   Modules accepted: Orders

## 2018-03-12 ENCOUNTER — Other Ambulatory Visit: Payer: Self-pay

## 2018-03-12 MED ORDER — LAMOTRIGINE 25 MG PO TABS: 25.0000 mg | ORAL_TABLET | Freq: Every day | ORAL | 3 refills | Status: DC

## 2018-03-22 DIAGNOSIS — Z79899 Other long term (current) drug therapy: Secondary | ICD-10-CM | POA: Diagnosis not present

## 2018-03-22 DIAGNOSIS — M533 Sacrococcygeal disorders, not elsewhere classified: Secondary | ICD-10-CM | POA: Diagnosis not present

## 2018-03-22 DIAGNOSIS — Z5181 Encounter for therapeutic drug level monitoring: Secondary | ICD-10-CM | POA: Diagnosis not present

## 2018-03-22 DIAGNOSIS — G43709 Chronic migraine without aura, not intractable, without status migrainosus: Secondary | ICD-10-CM | POA: Diagnosis not present

## 2018-04-18 ENCOUNTER — Other Ambulatory Visit: Payer: Self-pay | Admitting: Neurology

## 2018-05-06 ENCOUNTER — Other Ambulatory Visit: Payer: Self-pay | Admitting: Neurology

## 2018-05-09 DIAGNOSIS — M533 Sacrococcygeal disorders, not elsewhere classified: Secondary | ICD-10-CM | POA: Diagnosis not present

## 2018-05-09 DIAGNOSIS — G43709 Chronic migraine without aura, not intractable, without status migrainosus: Secondary | ICD-10-CM | POA: Diagnosis not present

## 2018-05-12 ENCOUNTER — Other Ambulatory Visit: Payer: Self-pay

## 2018-05-12 ENCOUNTER — Emergency Department (HOSPITAL_COMMUNITY)
Admission: EM | Admit: 2018-05-12 | Discharge: 2018-05-12 | Disposition: A | Discharge status: Home / Self Care | Payer: BLUE CROSS/BLUE SHIELD | Attending: Emergency Medicine | Admitting: Emergency Medicine

## 2018-05-12 ENCOUNTER — Encounter (HOSPITAL_COMMUNITY): Payer: Self-pay

## 2018-05-12 DIAGNOSIS — E039 Hypothyroidism, unspecified: Secondary | ICD-10-CM | POA: Insufficient documentation

## 2018-05-12 DIAGNOSIS — Z79899 Other long term (current) drug therapy: Secondary | ICD-10-CM | POA: Diagnosis not present

## 2018-05-12 DIAGNOSIS — R1011 Right upper quadrant pain: Secondary | ICD-10-CM

## 2018-05-12 LAB — COMPREHENSIVE METABOLIC PANEL
ALT: 36 U/L (ref 0–44)
AST: 27 U/L (ref 15–41)
Albumin: 4.2 g/dL (ref 3.5–5.0)
Alkaline Phosphatase: 84 U/L (ref 38–126)
Anion gap: 10 (ref 5–15)
BUN: 25 mg/dL — ABNORMAL HIGH (ref 6–20)
CO2: 27 mmol/L (ref 22–32)
Calcium: 9.6 mg/dL (ref 8.9–10.3)
Chloride: 103 mmol/L (ref 98–111)
Creatinine, Ser: 0.92 mg/dL (ref 0.44–1.00)
GFR calc Af Amer: >60 mL/min (ref >60)
GFR calc non Af Amer: >60 mL/min (ref >60)
Glucose, Bld: 71 mg/dL (ref 70–99)
Potassium: 3.8 mmol/L (ref 3.5–5.1)
Sodium: 140 mmol/L (ref 135–145)
Total Bilirubin: 0.3 mg/dL (ref 0.3–1.2)
Total Protein: 7.1 g/dL (ref 6.5–8.1)

## 2018-05-12 LAB — CBC
HCT: 42.6 % (ref 36.0–46.0)
Hemoglobin: 14.4 g/dL (ref 12.0–15.0)
MCH: 33.3 pg (ref 26.0–34.0)
MCHC: 33.8 g/dL (ref 30.0–36.0)
MCV: 98.6 fL (ref 80.0–100.0)
Platelets: 228 10*3/uL (ref 150–400)
RBC: 4.32 MIL/uL (ref 3.87–5.11)
RDW: 12.3 % (ref 11.5–15.5)
WBC: 8 10*3/uL (ref 4.0–10.5)
nRBC: 0 % (ref 0.0–0.2)

## 2018-05-12 LAB — URINALYSIS, ROUTINE W REFLEX MICROSCOPIC
Bilirubin Urine: NEGATIVE
Glucose, UA: NEGATIVE mg/dL
Hgb urine dipstick: NEGATIVE
Ketones, ur: NEGATIVE mg/dL
Leukocytes,Ua: NEGATIVE
Nitrite: NEGATIVE
Protein, ur: NEGATIVE mg/dL
Specific Gravity, Urine: 1.009 (ref 1.005–1.030)
pH: 7 (ref 5.0–8.0)

## 2018-05-12 LAB — LIPASE, BLOOD: Lipase: 33 U/L (ref 11–51)

## 2018-05-12 MED ORDER — ONDANSETRON HCL 8 MG PO TABS: 8.0000 mg | ORAL_TABLET | ORAL | 0 refills | Status: DC | PRN

## 2018-05-12 MED ORDER — FENTANYL CITRATE (PF) 100 MCG/2ML IJ SOLN
50.0000 ug | Freq: Once | INTRAMUSCULAR | Status: AC
  Administered 2018-05-12: 50 ug via INTRAVENOUS
  Filled 2018-05-12: qty 2

## 2018-05-12 MED ORDER — SODIUM CHLORIDE 0.9 % IV BOLUS
1000.0000 mL | Freq: Once | INTRAVENOUS | Status: AC
  Administered 2018-05-12: 1000 mL via INTRAVENOUS

## 2018-05-12 MED ORDER — SODIUM CHLORIDE 0.9% FLUSH: 3.0000 mL | Freq: Once | INTRAVENOUS | Status: DC

## 2018-05-12 NOTE — ED Triage Notes (Signed)
Pt reports right upper quadrant pain for one month with worsening for 1 week, pt feels bloated

## 2018-05-12 NOTE — Discharge Instructions (Addendum)
Blood work was good.  Ultrasound of your gallbladder tomorrow.  Prescription for Zofran E prescribed to your pharmacy.  Ultrasound report will be reviewed by the emergency doctor tomorrow.

## 2018-05-12 NOTE — ED Provider Notes (Signed)
[facility] EMERGENCY DEPARTMENT Provider Note   CSN: [id number] Arrival date & time: 05/12/18  1815    History   Chief Complaint Chief Complaint  Patient presents with  . Abdominal Pain    [name] is a 47 y.o. female.     Right upper quadrant pain with radiation to the upper back for 1 week, getting worse.  Symptoms associated with eating.  Mother and sister have had gallbladder surgery  No evidence of jaundice.  Severity is mild to moderate..     Past Medical History:  Diagnosis Date  . Headache(784.0)    migraines  . Hypothyroidism   . Migraines   . Occipital neuralgia     Patient Active Problem List   Diagnosis Date Noted  . Chronic migraine w/o aura w/o status migrainosus, not intractable 08/22/2015  . Abdominal pain, epigastric 07/03/2013  . Acute kidney injury ([place]) 10/20/2011  . Nausea & vomiting 10/19/2011  . Suprapubic abdominal pain 10/19/2011  . Dehydration 10/19/2011  . Hypothyroidism 10/19/2011  . Migraines 10/19/2011    Past Surgical History:  Procedure Laterality Date  . TUBAL LIGATION    . uterine ablation    . uterine polyps       OB History    Gravida      Para      Term      Preterm      AB      Living  0     SAB      TAB      Ectopic      Multiple      Live Births               Home Medications    Prior to Admission medications   Medication Sig Start Date End Date Taking? Authorizing Provider  ALPRAZolam Duanne Moron) 1 MG tablet Take 1 mg by mouth at bedtime. 05/01/16   [provider]  amitriptyline (ELAVIL) 50 MG tablet Take 3 tablets (150 mg total) by mouth at bedtime. 07/16/17   Melvenia Beam, MD  butalbital-acetaminophen-caffeine (FIORICET, ESGIC) 50-325-40 MG tablet TAKE (1) TABLET BY MOUTH EVERY SIX HOURS AS NEEDED. Max 10 per month. Patient taking differently: Take 1 tablet by mouth every 6 (six) hours as needed for headache or migraine. . Max 10 per month. 09/18/17   Melvenia Beam,  MD  HYDROcodone-acetaminophen (NORCO) 10-325 MG tablet Take 1 tablet by mouth every 6 (six) hours as needed for moderate pain or severe pain.  09/18/17   [provider]  lamoTRIgine (LAMICTAL) 25 MG tablet Take 1 tablet (25 mg total) by mouth daily. For 14 days; then BID for 2 wks; then 2 tabs BID thereafter 03/12/18   Pieter Partridge, DO  levothyroxine (SYNTHROID, LEVOTHROID) 150 MCG tablet Take 150 mcg by mouth daily before breakfast.    [provider]  lidocaine (LIDODERM) 5 % Place 1 patch onto the skin daily. Remove & Discard patch within 12 hours or as directed by MD 10/17/17   Evalee Jefferson, PA-C  Multiple Vitamin (MULTIVITAMIN WITH MINERALS) TABS tablet Take 1 tablet by mouth at bedtime.    [provider]  ondansetron (ZOFRAN ODT) 4 MG disintegrating tablet Take 1-2 tablets (4 mg total) by mouth every 8 (eight) hours as needed Patient taking differently: Take 4-8 mg by mouth every 8 (eight) hours as needed for nausea or vomiting. Take 1-2 tablets (4 mg total) by mouth every 8 (eight) hours as needed  08/18/15   Melvenia Beam, MD  ondansetron (ZOFRAN) 8 MG tablet Take 1 tablet (8 mg total) by mouth every 4 (four) hours as needed. 05/12/18   Nat Christen, MD  OXcarbazepine (TRILEPTAL) 150 MG tablet Take 1 tablet twice daily for 7 days, then 2 tablets twice daily 03/08/18   Pieter Partridge, DO  oxyCODONE-acetaminophen (PERCOCET) 5-325 MG tablet Take 1-2 tablets by mouth every 6 (six) hours as needed. 02/14/18   Veryl Speak, MD  prochlorperazine (COMPAZINE) 10 MG tablet Take 1 tablet (10 mg total) by mouth every 8 (eight) hours as needed for nausea or vomiting (Nausea). 09/19/16   Melvenia Beam, MD  promethazine (PHENERGAN) 25 MG suppository Place 1 suppository (25 mg total) rectally every 6 (six) hours as needed for nausea or vomiting. 02/15/18   Triplett, Tammy, PA-C  SUMAtriptan (IMITREX) 100 MG tablet TAKE 1 TABLET BY MOUTH EVERY 2 HOURS AS NEEDED FOR MIGRAINE. MAX 2 DOSES PER  DAY. Patient taking differently: Take 100 mg by mouth every 2 (two) hours as needed for migraine or headache. MAX 2 DOSES PER DAY. 05/10/17   Melvenia Beam, MD  SUMAtriptan 6 MG/0.5ML SOAJ INJECT 6MG  UNDER THE SKIN, MAY REPEAT IN 2 HOURS. MAX OF 2 IN 24 HOURS. Patient taking differently: Inject 6 mg into the skin as needed (for migraine pain. *Max of 2 doses within 24 hours).  09/07/17   Melvenia Beam, MD  tiZANidine (ZANAFLEX) 4 MG tablet TAKE ONE TABLET BY MOUTH EVERY 6 HOURS AS NEEDED FOR MUSCLE SPASMS. Patient taking differently: Take 4 mg by mouth every 6 (six) hours as needed for muscle spasms.  09/24/17   Melvenia Beam, MD  tiZANidine (ZANAFLEX) 4 MG tablet TAKE ONE TABLET BY MOUTH EVERY 6 HOURS AS NEEDED FOR MUSCLE SPASMS. Patient not taking: Reported on 10/16/2017 10/16/17   Melvenia Beam, MD  valACYclovir (VALTREX) 1000 MG tablet Take 1 tablet (1,000 mg total) by mouth 2 (two) times daily. 05/09/17   Cresenzo-Dishmon, Joaquim Lai, CNM  vitamin C (ASCORBIC ACID) 500 MG tablet Take 500 mg by mouth at bedtime.    [provider]    Family History Family History  Problem Relation Age of Onset  . Migraines Mother        benign small bowel tumor  . Prostate cancer Father   . Seizures Brother        MVA  . Migraines Other     Social History Social History   Tobacco Use  . Smoking status: Never Smoker  . Smokeless tobacco: Never Used  Substance Use Topics  . Alcohol use: Yes    Comment: social  . Drug use: No     Allergies   Aimovig [erenumab]; Compazine [prochlorperazine]; and Emgality [galcanezumab-gnlm]   Review of Systems Review of Systems  All other systems reviewed and are negative.    Physical Exam Updated Vital Signs BP 126/80   Pulse 75   Temp (!) 97.5 F (36.4 C) (Axillary)   Resp 16   Wt 65.8 kg   SpO2 99%   BMI 22.71 kg/m   Physical Exam Vitals signs and nursing note reviewed.  Constitutional:      Appearance: She is well-developed.   HENT:     Head: Normocephalic and atraumatic.  Eyes:     Conjunctiva/sclera: Conjunctivae normal.  Neck:     Musculoskeletal: Neck supple.  Cardiovascular:     Rate and Rhythm: Normal rate and regular rhythm.  Pulmonary:  Effort: Pulmonary effort is normal.     Breath sounds: Normal breath sounds.  Abdominal:     General: Bowel sounds are normal.     Palpations: Abdomen is soft.     Comments: Tender right upper quadrant.  Musculoskeletal: Normal range of motion.  Skin:    General: Skin is warm and dry.  Neurological:     Mental Status: She is alert and oriented to person, place, and time.  Psychiatric:        Behavior: Behavior normal.      ED Treatments / Results  Labs (all labs ordered are listed, but only abnormal results are displayed) Labs Reviewed  COMPREHENSIVE METABOLIC PANEL - Abnormal; Notable for the following components:      Result Value   BUN 25 (*)    All other components within normal limits  URINALYSIS, ROUTINE W REFLEX MICROSCOPIC - Abnormal; Notable for the following components:   APPearance CLOUDY (*)    All other components within normal limits  LIPASE, BLOOD  CBC    EKG None  Radiology No results found.  Procedures Procedures (including critical care time)  Medications Ordered in ED Medications  sodium chloride flush (NS) 0.9 % injection 3 mL (3 mLs Intravenous Not Given 05/12/18 1952)  sodium chloride 0.9 % bolus 1,000 mL (0 mLs Intravenous Stopped 05/12/18 2005)  fentaNYL (SUBLIMAZE) injection 50 mcg (50 mcg Intravenous Given 05/12/18 1908)     Initial Impression / Assessment and Plan / ED Course  I have reviewed the triage vital signs and the nursing notes.  Pertinent labs & imaging results that were available during my care of the patient were reviewed by me and considered in my medical decision making (see chart for details).        She and physical consistent with cholelithiasis.  Screening labs within normal limits.  Will  obtain gallbladder ultrasound on Monday.  Discharge medication Zofran 8 mg  Final Clinical Impressions(s) / ED Diagnoses   Final diagnoses:  RUQ pain    ED Discharge Orders         Ordered    ondansetron (ZOFRAN) 8 MG tablet  Every 4 hours PRN     05/12/18 2008    US Abdomen Limited RUQ/Gall Gladder     05/12/18 2009           Nat Christen, MD 05/12/18 2239

## 2018-05-13 DIAGNOSIS — G43709 Chronic migraine without aura, not intractable, without status migrainosus: Secondary | ICD-10-CM | POA: Diagnosis not present

## 2018-05-13 DIAGNOSIS — M533 Sacrococcygeal disorders, not elsewhere classified: Secondary | ICD-10-CM | POA: Diagnosis not present

## 2018-05-14 ENCOUNTER — Other Ambulatory Visit: Payer: Self-pay | Admitting: Neurology

## 2018-05-14 MED ORDER — BUTALBITAL-APAP-CAFFEINE 50-325-40 MG PO TABS: 1.0000 | ORAL_TABLET | Freq: Four times a day (QID) | ORAL | 4 refills | Status: DC | PRN

## 2018-05-15 ENCOUNTER — Ambulatory Visit (HOSPITAL_COMMUNITY)
Admission: RE | Admit: 2018-05-15 | Discharge: 2018-05-15 | Disposition: A | Discharge status: Home / Self Care | Payer: BLUE CROSS/BLUE SHIELD | Source: Ambulatory Visit | Attending: Emergency Medicine | Admitting: Emergency Medicine

## 2018-05-15 ENCOUNTER — Other Ambulatory Visit: Payer: Self-pay

## 2018-05-15 DIAGNOSIS — R1011 Right upper quadrant pain: Secondary | ICD-10-CM | POA: Diagnosis not present

## 2018-05-15 DIAGNOSIS — R109 Unspecified abdominal pain: Secondary | ICD-10-CM | POA: Diagnosis not present

## 2018-05-15 NOTE — ED Notes (Signed)
Attempted to call pt, no answer.  Left message for pt.

## 2018-05-27 ENCOUNTER — Other Ambulatory Visit: Payer: Self-pay

## 2018-05-27 ENCOUNTER — Ambulatory Visit (INDEPENDENT_AMBULATORY_CARE_PROVIDER_SITE_OTHER): Payer: BLUE CROSS/BLUE SHIELD | Admitting: Internal Medicine

## 2018-05-27 ENCOUNTER — Encounter (INDEPENDENT_AMBULATORY_CARE_PROVIDER_SITE_OTHER): Payer: Self-pay | Admitting: Internal Medicine

## 2018-05-27 DIAGNOSIS — R1011 Right upper quadrant pain: Secondary | ICD-10-CM | POA: Diagnosis not present

## 2018-05-27 NOTE — Progress Notes (Signed)
Subjective:    Patient ID: Martha Aguilar, female    DOB: 1970-10-28, 48 y.o.   MRN: [id number] I am unable to get a blood pressure on this patient standing.  HPI Referred by Dr .Luan Pulling for bloating and nausea.   Family hx of GB disease in mother and sister. Seen in the ED 05/12/2018 with RUQ pain associated with eating.  [name] RUQ 05/15/2018 revealed negative [name]. She says she explosive diarrhea, nausea, epigastric pain. Everything taste like dirt.  She says pain RUQ and shoots into her rt shoulder. Worse in the last 2 weeks. Thinks she may have lost about 5 pounds. She tells me she stays severely constipated. She has to take Miralax and enemas to have a BM.  She has had the constipation for years.   Hx of occipital neurologia.   CBC    Component Value Date/Time   WBC 8.0 05/12/2018 1837   RBC 4.32 05/12/2018 1837   HGB 14.4 05/12/2018 1837   HCT 42.6 05/12/2018 1837   PLT 228 05/12/2018 1837   MCV 98.6 05/12/2018 1837   MCH 33.3 05/12/2018 1837   MCHC 33.8 05/12/2018 1837   RDW 12.3 05/12/2018 1837   LYMPHSABS 0.9 02/14/2018 0448   MONOABS 0.5 02/14/2018 0448   EOSABS 0.3 02/14/2018 0448   BASOSABS 0.0 02/14/2018 0448   CMP Latest Ref Rng & Units 05/12/2018 02/14/2018 08/04/2016  Glucose 70 - 99 mg/dL 71 121(H) 125(H)  BUN 6 - 20 mg/dL 25(H) 10 13  Creatinine 0.44 - 1.00 mg/dL 0.92 0.90 1.08(H)  Sodium 135 - 145 mmol/L 140 142 139  Potassium 3.5 - 5.1 mmol/L 3.8 3.6 3.5  Chloride 98 - 111 mmol/L 103 106 108  CO2 22 - 32 mmol/L 27 27 26   Calcium 8.9 - 10.3 mg/dL 9.6 8.8(L) 9.5  Total Protein 6.5 - 8.1 g/dL 7.1 - 6.7  Total Bilirubin 0.3 - 1.2 mg/dL 0.3 - 0.7  Alkaline Phos 38 - 126 U/L 84 - 54  AST 15 - 41 U/L 27 - 23  ALT 0 - 44 U/L 36 - 36     Review of Systems Past Medical History:  Diagnosis Date  . Headache(784.0)    migraines  . Hypothyroidism   . Migraines   . Occipital neuralgia     Past Surgical History:  Procedure Laterality Date  . TUBAL LIGATION    .  uterine ablation    . uterine polyps      Allergies  Allergen Reactions  . Aimovig [Erenumab] Other (See Comments)    Burning in the back of the head, severe migraine  . Compazine [Prochlorperazine] Other (See Comments)    IV form, can take PO  . Emgality [Galcanezumab-Gnlm] Other (See Comments)    Ineffective     Current Outpatient Medications on File Prior to Visit  Medication Sig Dispense Refill  . ALPRAZolam (XANAX) 1 MG tablet Take 1 mg by mouth at bedtime.  5  . amitriptyline (ELAVIL) 50 MG tablet Take 3 tablets (150 mg total) by mouth at bedtime. 90 tablet 11  . butalbital-acetaminophen-caffeine (FIORICET) 50-325-40 MG tablet Take 1 tablet by mouth every 6 (six) hours as needed for headache. TAKE (1) TABLET BY MOUTH EVERY SIX HOURS AS NEEDED. Max 10 per month. 10 tablet 4  . levothyroxine (SYNTHROID, LEVOTHROID) 150 MCG tablet Take 150 mcg by mouth daily before breakfast.    . ondansetron (ZOFRAN) 8 MG tablet Take 1 tablet (8 mg total) by mouth every 4 (four)  hours as needed. 10 tablet 0  . SUMAtriptan (IMITREX) 100 MG tablet TAKE 1 TABLET BY MOUTH EVERY 2 HOURS AS NEEDED FOR MIGRAINE. MAX 2 DOSES PER DAY. (Patient taking differently: Take 100 mg by mouth every 2 (two) hours as needed for migraine or headache. MAX 2 DOSES PER DAY.) 10 tablet 11  . SUMAtriptan 6 MG/0.5ML SOAJ INJECT 6MG  UNDER THE SKIN, MAY REPEAT IN 2 HOURS. MAX OF 2 IN 24 HOURS. (Patient taking differently: Inject 6 mg into the skin as needed (for migraine pain. *Max of 2 doses within 24 hours). ) 5 mL 5  . Topiramate (TOPAMAX PO) Take 100 mg by mouth 2 (two) times a day.    . valACYclovir (VALTREX) 1000 MG tablet Take 1 tablet (1,000 mg total) by mouth 2 (two) times daily. 20 tablet 0  . vitamin C (ASCORBIC ACID) 500 MG tablet Take 500 mg by mouth at bedtime.    . Multiple Vitamin (MULTIVITAMIN WITH MINERALS) TABS tablet Take 1 tablet by mouth at bedtime.    . ondansetron (ZOFRAN ODT) 4 MG disintegrating tablet  Take 1-2 tablets (4 mg total) by mouth every 8 (eight) hours as needed (Patient taking differently: Take 4-8 mg by mouth every 8 (eight) hours as needed for nausea or vomiting. Take 1-2 tablets (4 mg total) by mouth every 8 (eight) hours as needed) 30 tablet 12   No current facility-administered medications on file prior to visit.         Past Medical History:  Diagnosis Date  . Headache(784.0)    migraines  . Hypothyroidism   . Migraines   . Occipital neuralgia     Past Surgical History:  Procedure Laterality Date  . TUBAL LIGATION    . uterine ablation    . uterine polyps      Allergies  Allergen Reactions  . Aimovig [Erenumab] Other (See Comments)    Burning in the back of the head, severe migraine  . Compazine [Prochlorperazine] Other (See Comments)    IV form, can take PO  . Emgality [Galcanezumab-Gnlm] Other (See Comments)    Ineffective     Current Outpatient Medications on File Prior to Visit  Medication Sig Dispense Refill  . ALPRAZolam (XANAX) 1 MG tablet Take 1 mg by mouth at bedtime.  5  . amitriptyline (ELAVIL) 50 MG tablet Take 3 tablets (150 mg total) by mouth at bedtime. 90 tablet 11  . butalbital-acetaminophen-caffeine (FIORICET) 50-325-40 MG tablet Take 1 tablet by mouth every 6 (six) hours as needed for headache. TAKE (1) TABLET BY MOUTH EVERY SIX HOURS AS NEEDED. Max 10 per month. 10 tablet 4  . levothyroxine (SYNTHROID, LEVOTHROID) 150 MCG tablet Take 150 mcg by mouth daily before breakfast.    . ondansetron (ZOFRAN) 8 MG tablet Take 1 tablet (8 mg total) by mouth every 4 (four) hours as needed. 10 tablet 0  . SUMAtriptan (IMITREX) 100 MG tablet TAKE 1 TABLET BY MOUTH EVERY 2 HOURS AS NEEDED FOR MIGRAINE. MAX 2 DOSES PER DAY. (Patient taking differently: Take 100 mg by mouth every 2 (two) hours as needed for migraine or headache. MAX 2 DOSES PER DAY.) 10 tablet 11  . SUMAtriptan 6 MG/0.5ML SOAJ INJECT 6MG  UNDER THE SKIN, MAY REPEAT IN 2 HOURS. MAX OF 2  IN 24 HOURS. (Patient taking differently: Inject 6 mg into the skin as needed (for migraine pain. *Max of 2 doses within 24 hours). ) 5 mL 5  . Topiramate (TOPAMAX PO) Take 100 mg  by mouth 2 (two) times a day.    . valACYclovir (VALTREX) 1000 MG tablet Take 1 tablet (1,000 mg total) by mouth 2 (two) times daily. 20 tablet 0  . vitamin C (ASCORBIC ACID) 500 MG tablet Take 500 mg by mouth at bedtime.    . Multiple Vitamin (MULTIVITAMIN WITH MINERALS) TABS tablet Take 1 tablet by mouth at bedtime.    . ondansetron (ZOFRAN ODT) 4 MG disintegrating tablet Take 1-2 tablets (4 mg total) by mouth every 8 (eight) hours as needed (Patient taking differently: Take 4-8 mg by mouth every 8 (eight) hours as needed for nausea or vomiting. Take 1-2 tablets (4 mg total) by mouth every 8 (eight) hours as needed) 30 tablet 12   No current facility-administered medications on file prior to visit.      Objective:   Physical Exam Blood pressure 94/80, pulse 68, temperature (!) 97 F (36.1 C), height 5' 7.5" (1.715 m), weight 147 lb 12.8 oz (67 kg).Alert and oriented. Skin warm and dry. Oral mucosa is moist.   . Sclera anicteric, conjunctivae is pink. Thyroid not enlarged. No cervical lymphadenopathy. Lungs clear. Heart regular rate and rhythm.  Abdomen is soft. Bowel sounds are positive. No hepatomegaly. No abdominal masses fel. Slight rt upper quadrant  tenderness.  No edema to lower extremities.         Assessment & Plan:  RT upper quadrant pain and epigastric pain. Am going to get a HIDA scan. Further recommendations to follow.

## 2018-05-27 NOTE — Patient Instructions (Signed)
Am going to get a HIDA scan.

## 2018-05-31 ENCOUNTER — Ambulatory Visit (HOSPITAL_COMMUNITY)
Admission: RE | Admit: 2018-05-31 | Discharge: 2018-05-31 | Disposition: A | Discharge status: Home / Self Care | Payer: BLUE CROSS/BLUE SHIELD | Source: Ambulatory Visit | Attending: Internal Medicine | Admitting: Internal Medicine

## 2018-05-31 ENCOUNTER — Other Ambulatory Visit: Payer: Self-pay

## 2018-05-31 DIAGNOSIS — R1011 Right upper quadrant pain: Secondary | ICD-10-CM | POA: Diagnosis not present

## 2018-05-31 MED ORDER — TECHNETIUM TC 99M MEBROFENIN IV KIT
5.0000 | PACK | Freq: Once | INTRAVENOUS | Status: AC | PRN
  Administered 2018-05-31: 08:00:00 5.11 via INTRAVENOUS

## 2018-06-03 DIAGNOSIS — R1010 Upper abdominal pain, unspecified: Secondary | ICD-10-CM | POA: Diagnosis not present

## 2018-06-04 ENCOUNTER — Encounter (HOSPITAL_COMMUNITY): Payer: Self-pay | Admitting: *Deleted

## 2018-06-04 ENCOUNTER — Telehealth (INDEPENDENT_AMBULATORY_CARE_PROVIDER_SITE_OTHER): Payer: Self-pay | Admitting: Internal Medicine

## 2018-06-04 ENCOUNTER — Other Ambulatory Visit: Payer: Self-pay

## 2018-06-04 ENCOUNTER — Emergency Department (HOSPITAL_COMMUNITY)
Admission: EM | Admit: 2018-06-04 | Discharge: 2018-06-04 | Disposition: A | Discharge status: Home / Self Care | Payer: BLUE CROSS/BLUE SHIELD | Attending: Emergency Medicine | Admitting: Emergency Medicine

## 2018-06-04 DIAGNOSIS — K219 Gastro-esophageal reflux disease without esophagitis: Secondary | ICD-10-CM

## 2018-06-04 DIAGNOSIS — R1010 Upper abdominal pain, unspecified: Secondary | ICD-10-CM

## 2018-06-04 DIAGNOSIS — Z79899 Other long term (current) drug therapy: Secondary | ICD-10-CM | POA: Insufficient documentation

## 2018-06-04 DIAGNOSIS — E039 Hypothyroidism, unspecified: Secondary | ICD-10-CM | POA: Insufficient documentation

## 2018-06-04 LAB — CBC WITH DIFFERENTIAL/PLATELET
Abs Immature Granulocytes: 0.02 10*3/uL (ref 0.00–0.07)
Basophils Absolute: 0.1 10*3/uL (ref 0.0–0.1)
Basophils Relative: 1 %
Eosinophils Absolute: 0 10*3/uL (ref 0.0–0.5)
Eosinophils Relative: 0 %
HCT: 43.9 % (ref 36.0–46.0)
Hemoglobin: 14.8 g/dL (ref 12.0–15.0)
Immature Granulocytes: 0 %
Lymphocytes Relative: 19 %
Lymphs Abs: 1.4 10*3/uL (ref 0.7–4.0)
MCH: 32.7 pg (ref 26.0–34.0)
MCHC: 33.7 g/dL (ref 30.0–36.0)
MCV: 97.1 fL (ref 80.0–100.0)
Monocytes Absolute: 0.7 10*3/uL (ref 0.1–1.0)
Monocytes Relative: 10 %
Neutro Abs: 5.2 10*3/uL (ref 1.7–7.7)
Neutrophils Relative %: 70 %
Platelets: 270 10*3/uL (ref 150–400)
RBC: 4.52 MIL/uL (ref 3.87–5.11)
RDW: 11.9 % (ref 11.5–15.5)
WBC: 7.4 10*3/uL (ref 4.0–10.5)
nRBC: 0 % (ref 0.0–0.2)

## 2018-06-04 LAB — COMPREHENSIVE METABOLIC PANEL
ALT: 44 U/L (ref 0–44)
AST: 30 U/L (ref 15–41)
Albumin: 4.1 g/dL (ref 3.5–5.0)
Alkaline Phosphatase: 100 U/L (ref 38–126)
Anion gap: 10 (ref 5–15)
BUN: 18 mg/dL (ref 6–20)
CO2: 29 mmol/L (ref 22–32)
Calcium: 9.5 mg/dL (ref 8.9–10.3)
Chloride: 103 mmol/L (ref 98–111)
Creatinine, Ser: 0.78 mg/dL (ref 0.44–1.00)
GFR calc Af Amer: >60 mL/min (ref >60)
GFR calc non Af Amer: >60 mL/min (ref >60)
Glucose, Bld: 107 mg/dL — ABNORMAL HIGH (ref 70–99)
Potassium: 3.9 mmol/L (ref 3.5–5.1)
Sodium: 142 mmol/L (ref 135–145)
Total Bilirubin: 0.4 mg/dL (ref 0.3–1.2)
Total Protein: 7.3 g/dL (ref 6.5–8.1)

## 2018-06-04 LAB — LIPASE, BLOOD: Lipase: 66 U/L — ABNORMAL HIGH (ref 11–51)

## 2018-06-04 MED ORDER — PANTOPRAZOLE SODIUM 40 MG PO TBEC: 40.0000 mg | DELAYED_RELEASE_TABLET | Freq: Two times a day (BID) | ORAL | 3 refills | Status: DC

## 2018-06-04 MED ORDER — HYDROXYZINE HCL 50 MG/ML IM SOLN
50.0000 mg | Freq: Once | INTRAMUSCULAR | Status: AC
  Administered 2018-06-04: 50 mg via INTRAMUSCULAR
  Filled 2018-06-04: qty 1

## 2018-06-04 MED ORDER — PANTOPRAZOLE SODIUM 40 MG PO TBEC
40.0000 mg | DELAYED_RELEASE_TABLET | Freq: Once | ORAL | Status: AC
  Administered 2018-06-04: 40 mg via ORAL
  Filled 2018-06-04: qty 1

## 2018-06-04 NOTE — Discharge Instructions (Addendum)
Take your Prilosec twice a day and follow-up with Dr. Luan Pulling within a week

## 2018-06-04 NOTE — ED Triage Notes (Signed)
Pt c/o abdominal pain that started today; pt had a scan performed last Friday and does not know the results yet

## 2018-06-04 NOTE — Telephone Encounter (Signed)
Ann, This patient is going to need an EGD with propofol.

## 2018-06-04 NOTE — Telephone Encounter (Signed)
Needs to be soon

## 2018-06-04 NOTE — ED Provider Notes (Signed)
[facility] EMERGENCY DEPARTMENT Provider Note   CSN: [id number] Arrival date & time: 06/04/18  0014    History   Chief Complaint Chief Complaint  Patient presents with  . Abdominal Pain    [name] is a 48 y.o. female.     Patient complains of electrical activity in her abdomen causing discomfort.  Patient has had an ultrasound and a HIDA scan recently that were unremarkable.  The history is provided by the patient. No language interpreter was used.  Abdominal Pain  Pain location:  Epigastric Pain quality: aching   Pain radiates to:  Does not radiate Pain severity:  Mild Onset quality:  Sudden Timing:  Constant Progression:  Waxing and waning Chronicity:  New Context: not alcohol use   Associated symptoms: no chest pain, no cough, no diarrhea, no fatigue and no hematuria     Past Medical History:  Diagnosis Date  . Headache(784.0)    migraines  . Hypothyroidism   . Migraines   . Occipital neuralgia     Patient Active Problem List   Diagnosis Date Noted  . Chronic migraine w/o aura w/o status migrainosus, not intractable 08/22/2015  . Abdominal pain, epigastric 07/03/2013  . Acute kidney injury ([place]) 10/20/2011  . Nausea & vomiting 10/19/2011  . Suprapubic abdominal pain 10/19/2011  . Dehydration 10/19/2011  . Hypothyroidism 10/19/2011  . Migraines 10/19/2011    Past Surgical History:  Procedure Laterality Date  . TUBAL LIGATION    . uterine ablation    . uterine polyps       OB History    Gravida      Para      Term      Preterm      AB      Living  0     SAB      TAB      Ectopic      Multiple      Live Births               Home Medications    Prior to Admission medications   Medication Sig Start Date End Date Taking? Authorizing Provider  ALPRAZolam Duanne Moron) 1 MG tablet Take 1 mg by mouth at bedtime. 05/01/16   [provider]  amitriptyline (ELAVIL) 50 MG tablet Take 3 tablets (150 mg total) by  mouth at bedtime. 07/16/17   Melvenia Beam, MD  butalbital-acetaminophen-caffeine (FIORICET) [phone] MG tablet Take 1 tablet by mouth every 6 (six) hours as needed for headache. TAKE (1) TABLET BY MOUTH EVERY SIX HOURS AS NEEDED. Max 10 per month. 05/14/18   Pieter Partridge, DO  levothyroxine (SYNTHROID, LEVOTHROID) 150 MCG tablet Take 150 mcg by mouth daily before breakfast.    [provider]  Multiple Vitamin (MULTIVITAMIN WITH MINERALS) TABS tablet Take 1 tablet by mouth at bedtime.    [provider]  ondansetron (ZOFRAN ODT) 4 MG disintegrating tablet Take 1-2 tablets (4 mg total) by mouth every 8 (eight) hours as needed Patient taking differently: Take 4-8 mg by mouth every 8 (eight) hours as needed for nausea or vomiting. Take 1-2 tablets (4 mg total) by mouth every 8 (eight) hours as needed 08/18/15   Melvenia Beam, MD  ondansetron (ZOFRAN) 8 MG tablet Take 1 tablet (8 mg total) by mouth every 4 (four) hours as needed. 05/12/18   Nat Christen, MD  SUMAtriptan (IMITREX) 100 MG tablet TAKE 1 TABLET BY MOUTH EVERY 2 HOURS AS NEEDED  FOR MIGRAINE. MAX 2 DOSES PER DAY. Patient taking differently: Take 100 mg by mouth every 2 (two) hours as needed for migraine or headache. MAX 2 DOSES PER DAY. 05/10/17   Melvenia Beam, MD  SUMAtriptan 6 MG/0.5ML SOAJ INJECT 6MG  UNDER THE SKIN, MAY REPEAT IN 2 HOURS. MAX OF 2 IN 24 HOURS. Patient taking differently: Inject 6 mg into the skin as needed (for migraine pain. *Max of 2 doses within 24 hours).  09/07/17   Melvenia Beam, MD  Topiramate (TOPAMAX PO) Take 100 mg by mouth 2 (two) times a day.    [provider]  valACYclovir (VALTREX) 1000 MG tablet Take 1 tablet (1,000 mg total) by mouth 2 (two) times daily. 05/09/17   Cresenzo-Dishmon, Joaquim Lai, CNM  vitamin C (ASCORBIC ACID) 500 MG tablet Take 500 mg by mouth at bedtime.    [provider]    Family History Family History  Problem Relation Age of Onset  . Migraines  Mother        benign small bowel tumor  . Prostate cancer Father   . Seizures Brother        MVA  . Migraines Other     Social History Social History   Tobacco Use  . Smoking status: Never Smoker  . Smokeless tobacco: Never Used  Substance Use Topics  . Alcohol use: Yes    Comment: social  . Drug use: No     Allergies   Aimovig [erenumab]; Compazine [prochlorperazine]; and Emgality [galcanezumab-gnlm]   Review of Systems Review of Systems  Constitutional: Negative for appetite change and fatigue.  HENT: Negative for congestion, ear discharge and sinus pressure.   Eyes: Negative for discharge.  Respiratory: Negative for cough.   Cardiovascular: Negative for chest pain.  Gastrointestinal: Positive for abdominal pain. Negative for diarrhea.  Genitourinary: Negative for frequency and hematuria.  Musculoskeletal: Negative for back pain.  Skin: Negative for rash.  Neurological: Negative for seizures and headaches.  Psychiatric/Behavioral: Negative for hallucinations.     Physical Exam Updated Vital Signs BP (!) 163/81   Pulse (!) 133   Temp 98 F (36.7 C) (Oral)   Resp 18   Ht 5' 7.5" (1.715 m)   Wt 67 kg   SpO2 98%   BMI 22.79 kg/m   Physical Exam Vitals signs and nursing note reviewed.  Constitutional:      Appearance: She is well-developed.  HENT:     Head: Normocephalic.     Nose: Nose normal.  Eyes:     General: No scleral icterus.    Conjunctiva/sclera: Conjunctivae normal.  Neck:     Musculoskeletal: Neck supple.     Thyroid: No thyromegaly.  Cardiovascular:     Rate and Rhythm: Normal rate and regular rhythm.     Heart sounds: No murmur. No friction rub. No gallop.   Pulmonary:     Breath sounds: No stridor. No wheezing or rales.  Chest:     Chest wall: No tenderness.  Abdominal:     General: There is no distension.     Tenderness: There is abdominal tenderness. There is no rebound.     Comments: Minimal epigastric discomfort   Musculoskeletal: Normal range of motion.  Lymphadenopathy:     Cervical: No cervical adenopathy.  Skin:    Findings: No erythema or rash.  Neurological:     Mental Status: She is oriented to person, place, and time.     Motor: No abnormal muscle tone.  Coordination: Coordination normal.  Psychiatric:        Behavior: Behavior normal.      ED Treatments / Results  Labs (all labs ordered are listed, but only abnormal results are displayed) Labs Reviewed  COMPREHENSIVE METABOLIC PANEL - Abnormal; Notable for the following components:      Result Value   Glucose, Bld 107 (*)    All other components within normal limits  LIPASE, BLOOD - Abnormal; Notable for the following components:   Lipase 66 (*)    All other components within normal limits  CBC WITH DIFFERENTIAL/PLATELET    EKG None  Radiology No results found.  Procedures Procedures (including critical care time)  Medications Ordered in ED Medications  pantoprazole (PROTONIX) EC tablet 40 mg (has no administration in time range)  hydrOXYzine (VISTARIL) injection 50 mg (50 mg Intramuscular Given 06/04/18 0228)     Initial Impression / Assessment and Plan / ED Course  I have reviewed the triage vital signs and the nursing notes.  Pertinent labs & imaging results that were available during my care of the patient were reviewed by me and considered in my medical decision making (see chart for details).  Labs reviewed.  Old CT and ultrasound results reviewed.  Patient will be placed on Prilosec and will follow-up with her PCP suspect gastritis Final Clinical Impressions(s) / ED Diagnoses   Final diagnoses:  Pain of upper abdomen    ED Discharge Orders    None       Milton Ferguson, MD 06/04/18 [phone]

## 2018-06-05 ENCOUNTER — Other Ambulatory Visit (INDEPENDENT_AMBULATORY_CARE_PROVIDER_SITE_OTHER): Payer: Self-pay | Admitting: Internal Medicine

## 2018-06-05 DIAGNOSIS — R1011 Right upper quadrant pain: Secondary | ICD-10-CM

## 2018-06-06 DIAGNOSIS — R1011 Right upper quadrant pain: Secondary | ICD-10-CM | POA: Insufficient documentation

## 2018-06-06 NOTE — Telephone Encounter (Signed)
EGD sch'd 06/13/18, patient aware, verbal instructions given

## 2018-06-07 ENCOUNTER — Other Ambulatory Visit: Payer: Self-pay | Admitting: Neurology

## 2018-06-07 ENCOUNTER — Other Ambulatory Visit: Payer: Self-pay

## 2018-06-07 ENCOUNTER — Ambulatory Visit (INDEPENDENT_AMBULATORY_CARE_PROVIDER_SITE_OTHER): Payer: BLUE CROSS/BLUE SHIELD | Admitting: Neurology

## 2018-06-07 DIAGNOSIS — G43709 Chronic migraine without aura, not intractable, without status migrainosus: Secondary | ICD-10-CM

## 2018-06-07 MED ORDER — ONABOTULINUMTOXINA 100 UNITS IJ SOLR
155.0000 [IU] | Freq: Once | INTRAMUSCULAR | Status: AC
  Administered 2018-06-07: 16:00:00 155 [IU] via INTRAMUSCULAR

## 2018-06-07 MED ORDER — GABAPENTIN 300 MG PO CAPS: 600.0000 mg | ORAL_CAPSULE | Freq: Three times a day (TID) | ORAL | 3 refills | Status: DC

## 2018-06-07 NOTE — Progress Notes (Signed)
[facility]   Procedure Note Botox  Attending: Dr. Metta Clines  Preoperative Diagnosis(es): Chronic migraine  Consent obtained from: The patient Benefits discussed included, but were not limited to decreased muscle tightness, increased joint range of motion, and decreased pain.  Risk discussed included, but were not limited pain and discomfort, bleeding, bruising, excessive weakness, venous thrombosis, muscle atrophy and dysphagia.  Anticipated outcomes of the procedure as well as he risks and benefits of the alternatives to the procedure, and the roles and tasks of the personnel to be involved, were discussed with the patient, and the patient consents to the procedure and agrees to proceed. A copy of the patient medication guide was given to the patient which explains the blackbox warning.  Patients identity and treatment sites confirmed Yes.  Details of Procedure: Skin was cleaned with alcohol. Prior to injection, the needle plunger was aspirated to make sure the needle was not within a blood vessel.  There was no blood retrieved on aspiration.    Following is a summary of the muscles injected  And the amount of Botulinum toxin used:  Dilution 200 units of Botox was reconstituted with 4 ml of preservative free normal saline. Time of reconstitution: At the time of the office visit (<30 minutes prior to injection)   Injections  155 total units of Botox was injected with a 30 gauge needle.  Injection Sites: L occipitalis: 15 units- 3 sites  R occiptalis: 15 units- 3 sites  L upper trapezius: 15 units- 3 sites R upper trapezius: 15 units- 3 sits          L paraspinal: 10 units- 2 sites R paraspinal: 10 units- 2 sites  Face L frontalis(2 injection sites):10 units   R frontalis(2 injection sites):10 units         L corrugator: 5 units   R corrugator: 5 units           Procerus: 5 units   L temporalis: 20 units R temporalis: 20 units   Agent:  200 units of botulinum Type A  (Onobotulinum Toxin type A) was reconstituted with 4 ml of preservative free normal saline.  Time of reconstitution: At the time of the office visit (<30 minutes prior to injection)     Total injected (Units): 155  Total wasted (Units): 7.5  Patient tolerated procedure well without complications.   Reinjection is anticipated in 3 months.

## 2018-06-10 ENCOUNTER — Other Ambulatory Visit (HOSPITAL_COMMUNITY)
Admission: RE | Admit: 2018-06-10 | Discharge: 2018-06-10 | Disposition: A | Discharge status: Home / Self Care | Payer: BC Managed Care – PPO | Source: Ambulatory Visit | Attending: Internal Medicine | Admitting: Internal Medicine

## 2018-06-10 ENCOUNTER — Encounter (HOSPITAL_COMMUNITY): Payer: Self-pay

## 2018-06-10 ENCOUNTER — Encounter (HOSPITAL_COMMUNITY)
Admission: RE | Admit: 2018-06-10 | Discharge: 2018-06-10 | Disposition: A | Discharge status: Home / Self Care | Payer: BLUE CROSS/BLUE SHIELD | Source: Ambulatory Visit | Attending: Internal Medicine | Admitting: Internal Medicine

## 2018-06-10 ENCOUNTER — Other Ambulatory Visit: Payer: Self-pay

## 2018-06-10 DIAGNOSIS — Z1159 Encounter for screening for other viral diseases: Secondary | ICD-10-CM | POA: Diagnosis not present

## 2018-06-10 DIAGNOSIS — R1011 Right upper quadrant pain: Secondary | ICD-10-CM

## 2018-06-11 DIAGNOSIS — M6088 Other myositis, other site: Secondary | ICD-10-CM | POA: Diagnosis not present

## 2018-06-11 DIAGNOSIS — M545 Low back pain: Secondary | ICD-10-CM | POA: Diagnosis not present

## 2018-06-11 DIAGNOSIS — M7912 Myalgia of auxiliary muscles, head and neck: Secondary | ICD-10-CM | POA: Diagnosis not present

## 2018-06-11 LAB — NOVEL CORONAVIRUS, NAA (HOSP ORDER, SEND-OUT TO REF LAB; TAT 18-24 HRS): SARS-CoV-2, NAA: NOT DETECTED

## 2018-06-13 ENCOUNTER — Encounter (HOSPITAL_COMMUNITY)
Admission: RE | Disposition: A | Discharge status: Home / Self Care | Payer: Self-pay | Source: Home / Self Care | Attending: Internal Medicine

## 2018-06-13 ENCOUNTER — Ambulatory Visit (HOSPITAL_COMMUNITY): Payer: BC Managed Care – PPO | Admitting: Anesthesiology

## 2018-06-13 ENCOUNTER — Other Ambulatory Visit: Payer: Self-pay

## 2018-06-13 ENCOUNTER — Ambulatory Visit (HOSPITAL_COMMUNITY)
Admission: RE | Admit: 2018-06-13 | Discharge: 2018-06-13 | Disposition: A | Discharge status: Home / Self Care | Payer: BC Managed Care – PPO | Attending: Internal Medicine | Admitting: Internal Medicine

## 2018-06-13 DIAGNOSIS — K634 Enteroptosis: Secondary | ICD-10-CM | POA: Diagnosis not present

## 2018-06-13 DIAGNOSIS — R634 Abnormal weight loss: Secondary | ICD-10-CM | POA: Diagnosis not present

## 2018-06-13 DIAGNOSIS — Z79899 Other long term (current) drug therapy: Secondary | ICD-10-CM | POA: Insufficient documentation

## 2018-06-13 DIAGNOSIS — R1010 Upper abdominal pain, unspecified: Secondary | ICD-10-CM | POA: Diagnosis not present

## 2018-06-13 DIAGNOSIS — G43909 Migraine, unspecified, not intractable, without status migrainosus: Secondary | ICD-10-CM | POA: Diagnosis not present

## 2018-06-13 DIAGNOSIS — K319 Disease of stomach and duodenum, unspecified: Secondary | ICD-10-CM | POA: Diagnosis not present

## 2018-06-13 DIAGNOSIS — M5481 Occipital neuralgia: Secondary | ICD-10-CM | POA: Insufficient documentation

## 2018-06-13 DIAGNOSIS — R1011 Right upper quadrant pain: Secondary | ICD-10-CM

## 2018-06-13 DIAGNOSIS — Z79890 Hormone replacement therapy (postmenopausal): Secondary | ICD-10-CM | POA: Diagnosis not present

## 2018-06-13 DIAGNOSIS — E039 Hypothyroidism, unspecified: Secondary | ICD-10-CM | POA: Insufficient documentation

## 2018-06-13 DIAGNOSIS — R110 Nausea: Secondary | ICD-10-CM | POA: Diagnosis not present

## 2018-06-13 DIAGNOSIS — K3189 Other diseases of stomach and duodenum: Secondary | ICD-10-CM | POA: Diagnosis not present

## 2018-06-13 HISTORY — PX: ESOPHAGOGASTRODUODENOSCOPY (EGD) WITH PROPOFOL: SHX5813

## 2018-06-13 SURGERY — ESOPHAGOGASTRODUODENOSCOPY (EGD) WITH PROPOFOL
Anesthesia: Monitor Anesthesia Care

## 2018-06-13 MED ORDER — CHLORHEXIDINE GLUCONATE CLOTH 2 % EX PADS: 6.0000 | MEDICATED_PAD | Freq: Once | CUTANEOUS | Status: DC

## 2018-06-13 MED ORDER — PROPOFOL 500 MG/50ML IV EMUL
INTRAVENOUS | Status: DC | PRN
  Administered 2018-06-13: 150 ug/kg/min via INTRAVENOUS

## 2018-06-13 MED ORDER — LACTATED RINGERS IV SOLN
INTRAVENOUS | Status: DC
  Administered 2018-06-13: 14:00:00 via INTRAVENOUS

## 2018-06-13 MED ORDER — PROMETHAZINE HCL 25 MG/ML IJ SOLN: 6.2500 mg | INTRAMUSCULAR | Status: DC | PRN

## 2018-06-13 MED ORDER — LIDOCAINE 5 % EX PTCH: 1.0000 | MEDICATED_PATCH | CUTANEOUS | 1 refills | Status: DC

## 2018-06-13 MED ORDER — LACTATED RINGERS IV SOLN: INTRAVENOUS | Status: DC

## 2018-06-13 MED ORDER — HYDROMORPHONE HCL 1 MG/ML IJ SOLN: 0.2500 mg | INTRAMUSCULAR | Status: DC | PRN

## 2018-06-13 MED ORDER — KETAMINE HCL 50 MG/5ML IJ SOSY
PREFILLED_SYRINGE | INTRAMUSCULAR | Status: AC
  Filled 2018-06-13: qty 5

## 2018-06-13 MED ORDER — KETAMINE HCL 10 MG/ML IJ SOLN
INTRAMUSCULAR | Status: DC | PRN
  Administered 2018-06-13: 10 mg via INTRAVENOUS

## 2018-06-13 MED ORDER — HYDROCODONE-ACETAMINOPHEN 7.5-325 MG PO TABS: 1.0000 | ORAL_TABLET | Freq: Once | ORAL | Status: DC | PRN

## 2018-06-13 MED ORDER — GLYCOPYRROLATE 0.2 MG/ML IJ SOLN
INTRAMUSCULAR | Status: DC | PRN
  Administered 2018-06-13: 0.2 mg via INTRAVENOUS

## 2018-06-13 MED ORDER — PROPOFOL 10 MG/ML IV BOLUS
INTRAVENOUS | Status: DC | PRN
  Administered 2018-06-13: 20 mg via INTRAVENOUS

## 2018-06-13 MED ORDER — MEPERIDINE HCL 50 MG/ML IJ SOLN: 6.2500 mg | INTRAMUSCULAR | Status: DC | PRN

## 2018-06-13 MED ORDER — LIDOCAINE HCL (CARDIAC) PF 100 MG/5ML IV SOSY
PREFILLED_SYRINGE | INTRAVENOUS | Status: DC | PRN
  Administered 2018-06-13: 40 mg via INTRAVENOUS

## 2018-06-13 NOTE — Anesthesia Preprocedure Evaluation (Signed)
Anesthesia Evaluation    Airway Mallampati: II       Dental  (+) Teeth Intact   Pulmonary    breath sounds clear to auscultation       Cardiovascular  Rhythm:regular     Neuro/Psych  Headaches,    GI/Hepatic   Endo/Other  Hypothyroidism   Renal/GU Renal disease     Musculoskeletal   Abdominal   Peds  Hematology   Anesthesia Other Findings Chronic pain pt, Occipital neuralgia  NO opiates Frail secondary to complaints of neuralgia  Reproductive/Obstetrics                             Anesthesia Physical Anesthesia Plan  ASA: III  Anesthesia Plan: MAC   Post-op Pain Management:    Induction:   PONV Risk Score and Plan:   Airway Management Planned:   Additional Equipment:   Intra-op Plan:   Post-operative Plan:   Informed Consent: I have reviewed the patients History and Physical, chart, labs and discussed the procedure including the risks, benefits and alternatives for the proposed anesthesia with the patient or authorized representative who has indicated his/her understanding and acceptance.       Plan Discussed with: Anesthesiologist  Anesthesia Plan Comments:         Anesthesia Quick Evaluation

## 2018-06-13 NOTE — Op Note (Signed)
[facility] Patient Name: Martha Aguilar Procedure Date: 06/13/2018 1:38 PM MRN: [id number] Date of Birth: Jun 03, 1970 Attending MD: Hildred Laser , MD CSN: [id number] Age: 48 Admit Type: Outpatient Procedure:                Upper GI endoscopy Indications:              Upper abdominal pain, Nausea, Weight loss Providers:                Hildred Laser, MD, Janeece Riggers, RN, Raphael Gibney,                            Technician, Randa Spike, Technician Referring MD:             Jasper Loser. Luan Pulling MD, MD Medicines:                Propofol per Anesthesia Complications:            No immediate complications. Estimated Blood Loss:     Estimated blood loss: none. Procedure:                Pre-Anesthesia Assessment:                           - Prior to the procedure, a History and Physical                            was performed, and patient medications and                            allergies were reviewed. The patient's tolerance of                            previous anesthesia was also reviewed. The risks                            and benefits of the procedure and the sedation                            options and risks were discussed with the patient.                            All questions were answered, and informed consent                            was obtained. Prior Anticoagulants: The patient has                            taken no previous anticoagulant or antiplatelet                            agents. ASA Grade Assessment: III - A patient with                            severe systemic disease. After reviewing the risks  and benefits, the patient was deemed in                            satisfactory condition to undergo the procedure.                           After obtaining informed consent, the endoscope was                            passed under direct vision. Throughout the                            procedure, the patient's blood pressure,  pulse, and                            oxygen saturations were monitored continuously. The                            [id number]-H190 ([id number]) scope was introduced through the                            and advanced to the second part of duodenum. The                            upper GI endoscopy was accomplished without                            difficulty. The patient tolerated the procedure                            well. Scope In: 2:12:26 PM Scope Out: 2:18:14 PM Total Procedure Duration: 0 hours 5 minutes 48 seconds  Findings:      The examined esophagus was normal.      The Z-line was regular and was found 36 cm from the incisors.      Patchy mildly erythematous mucosa without bleeding was found in the       gastric antrum and in the prepyloric region of the stomach. Biopsies       were taken with a cold forceps for histology.      The exam of the stomach was otherwise normal.      The duodenal bulb and second portion of the duodenum were normal. Impression:               - Normal esophagus.                           - Z-line regular, 36 cm from the incisors.                           - Erythematous mucosa in the antrum and prepyloric                            region of the stomach. Biopsied.                           -  Normal duodenal bulb and second portion of the                            duodenum. Moderate Sedation:      Per Anesthesia Care Recommendation:           - Patient has a contact number available for                            emergencies. The signs and symptoms of potential                            delayed complications were discussed with the                            patient. Return to normal activities tomorrow.                            Written discharge instructions were provided to the                            patient.                           - Resume previous diet today.                           - Continue present medications.                            - No aspirin, ibuprofen, naproxen, or other                            non-steroidal anti-inflammatory drugs for 1 day.                           - Await pathology results.                           - Lidoderm patch use as directed. Procedure Code(s):        --- Professional ---                           [phone], Esophagogastroduodenoscopy, flexible,                            transoral; with biopsy, single or multiple Diagnosis Code(s):        --- Professional ---                           K31.89, Other diseases of stomach and duodenum                           R10.10, Upper abdominal pain, unspecified                           R11.0, Nausea  R63.4, Abnormal weight loss CPT copyright 2019 [employer]. All rights reserved. The codes documented in this report are preliminary and upon coder review may  be revised to meet current compliance requirements. Hildred Laser, MD Hildred Laser, MD 06/13/2018 2:26:01 PM This report has been signed electronically. Number of Addenda: 0

## 2018-06-13 NOTE — Discharge Instructions (Signed)
No aspirin or NSAIDs for 24 hours. Resume other medications as before. Lidoderm patch apply every morning and remove after 12 hours. Resume usual diet. No driving for 24 hours. Physician will call with biopsy results.     Upper Endoscopy, Adult, Care After This sheet gives you information about how to care for yourself after your procedure. Your health care provider may also give you more specific instructions. If you have problems or questions, contact your health care provider. What can I expect after the procedure? After the procedure, it is common to have:  A sore throat.  Mild stomach pain or discomfort.  Bloating.  Nausea. Follow these instructions at home:   Follow instructions from your health care provider about what to eat or drink after your procedure.  Return to your normal activities as told by your health care provider. Ask your health care provider what activities are safe for you.  Take over-the-counter and prescription medicines only as told by your health care provider.  Do not drive for 24 hours if you were given a sedative during your procedure.  Keep all follow-up visits as told by your health care provider. This is important. Contact a health care provider if you have:  A sore throat that lasts longer than one day.  Trouble swallowing. Get help right away if:  You vomit blood or your vomit looks like coffee grounds.  You have: ? A fever. ? Bloody, black, or tarry stools. ? A severe sore throat or you cannot swallow. ? Difficulty breathing. ? Severe pain in your chest or abdomen. Summary  After the procedure, it is common to have a sore throat, mild stomach discomfort, bloating, and nausea.  Do not drive for 24 hours if you were given a sedative during the procedure.  Follow instructions from your health care provider about what to eat or drink after your procedure.  Return to your normal activities as told by your health care  provider. This information is not intended to replace advice given to you by your health care provider. Make sure you discuss any questions you have with your health care provider. Document Released: 06/27/2011 Document Revised: 05/28/2017 Document Reviewed: 05/28/2017 Elsevier Interactive Patient Education  2019 [employer], Care After These instructions provide you with information about caring for yourself after your procedure. Your health care provider may also give you more specific instructions. Your treatment has been planned according to current medical practices, but problems sometimes occur. Call your health care provider if you have any problems or questions after your procedure. What can I expect after the procedure? After your procedure, you may:  Feel sleepy for several hours.  Feel clumsy and have poor balance for several hours.  Feel forgetful about what happened after the procedure.  Have poor judgment for several hours.  Feel nauseous or vomit.  Have a sore throat if you had a breathing tube during the procedure. Follow these instructions at home: For at least 24 hours after the procedure:      Have a responsible adult stay with you. It is important to have someone help care for you until you are awake and alert.  Rest as needed.  Do not: ? Participate in activities in which you could fall or become injured. ? Drive. ? Use heavy machinery. ? Drink alcohol. ? Take sleeping pills or medicines that cause drowsiness. ? Make important decisions or sign legal documents. ? Take care of children on  your own. Eating and drinking  Follow the diet that is recommended by your health care provider.  If you vomit, drink water, juice, or soup when you can drink without vomiting.  Make sure you have little or no nausea before eating solid foods. General instructions  Take over-the-counter and prescription medicines only as told by  your health care provider.  If you have sleep apnea, surgery and certain medicines can increase your risk for breathing problems. Follow instructions from your health care provider about wearing your sleep device: ? Anytime you are sleeping, including during daytime naps. ? While taking prescription pain medicines, sleeping medicines, or medicines that make you drowsy.  If you smoke, do not smoke without supervision.  Keep all follow-up visits as told by your health care provider. This is important. Contact a health care provider if:  You keep feeling nauseous or you keep vomiting.  You feel light-headed.  You develop a rash.  You have a fever. Get help right away if:  You have trouble breathing. Summary  For several hours after your procedure, you may feel sleepy and have poor judgment.  Have a responsible adult stay with you for at least 24 hours or until you are awake and alert. This information is not intended to replace advice given to you by your health care provider. Make sure you discuss any questions you have with your health care provider. Document Released: 04/18/2015 Document Revised: 08/11/2016 Document Reviewed: 04/18/2015 Elsevier Interactive Patient Education  2019 [employer].

## 2018-06-13 NOTE — Anesthesia Postprocedure Evaluation (Signed)
Anesthesia Post Note  Patient: Martha Aguilar  Procedure(s) Performed: ESOPHAGOGASTRODUODENOSCOPY (EGD) WITH PROPOFOL (N/A )  Patient location during evaluation: PACU Anesthesia Type: MAC Level of consciousness: combative, oriented and awake Pain management: pain level controlled Vital Signs Assessment: post-procedure vital signs reviewed and stable Respiratory status: spontaneous breathing Cardiovascular status: stable Postop Assessment: no apparent nausea or vomiting Anesthetic complications: no     Last Vitals:  Vitals:   06/13/18 1428 06/13/18 1430  BP: (!) 108/56 98/81  Pulse: 82 80  Resp: (!) 9   Temp:  (P) 36.8 C  SpO2: 100% 100%    Last Pain:  Vitals:   06/13/18 1354  TempSrc: Oral  PainSc: 8                  ADAMS, AMY A

## 2018-06-13 NOTE — Transfer of Care (Signed)
Immediate Anesthesia Transfer of Care Note  Patient: Martha Aguilar  Procedure(s) Performed: ESOPHAGOGASTRODUODENOSCOPY (EGD) WITH PROPOFOL (N/A )  Patient Location: PACU  Anesthesia Type:MAC  Level of Consciousness: awake, alert , oriented and patient cooperative  Airway & Oxygen Therapy: Patient Spontanous Breathing  Post-op Assessment: Report given to RN and Post -op Vital signs reviewed and stable  Post vital signs: Reviewed and stable  Last Vitals:  Vitals Value Taken Time  BP 98/81 06/13/2018  2:30 PM  Temp    Pulse 79 06/13/2018  2:32 PM  Resp 16 06/13/2018  2:32 PM  SpO2 95 % 06/13/2018  2:32 PM  Vitals shown include unvalidated device data.  Last Pain:  Vitals:   06/13/18 1354  TempSrc: Oral  PainSc: 8       Patients Stated Pain Goal: 5 (57/84/69 [id number])  Complications: No apparent anesthesia complications

## 2018-06-13 NOTE — H&P (Signed)
Martha Aguilar is an 48 y.o. female.   Chief Complaint: Patient is here for EGD. HPI: Patient is 48 year old Caucasian female who presents with 71-month history of pain across her upper abdomen described as electric shocks.  She has daily pain.  She has nausea and has lost appetite.  She states she has lost 12 pounds since her pain started.  She has been on pantoprazole for 1 week but it has not helped.  She has not had vomiting melena or rectal bleeding.  He is an ultrasound and hepatobiliary scans were unremarkable. She does not take OTC NSAIDs. History significant for occipital neuralgia.  Past Medical History:  Diagnosis Date  . Headache(784.0)    migraines  . Hypothyroidism   . Migraines   . Occipital neuralgia     Past Surgical History:  Procedure Laterality Date  . TUBAL LIGATION    . uterine ablation    . uterine polyps      Family History  Problem Relation Age of Onset  . Migraines Mother        benign small bowel tumor  . Prostate cancer Father   . Seizures Brother        MVA  . Migraines Other    Social History:  reports that she has never smoked. She has never used smokeless tobacco. She reports current alcohol use. She reports that she does not use drugs.  Allergies:  Allergies  Allergen Reactions  . Aimovig [Erenumab] Other (See Comments)    Burning in the back of the head, severe migraine  . Compazine [Prochlorperazine] Other (See Comments)    IV form, can take PO  . Emgality [Galcanezumab-Gnlm] Other (See Comments)    Ineffective     Medications Prior to Admission  Medication Sig Dispense Refill  . ALPRAZolam (XANAX) 1 MG tablet Take 1 mg by mouth at bedtime.  5  . amitriptyline (ELAVIL) 50 MG tablet Take 3 tablets (150 mg total) by mouth at bedtime. 90 tablet 11  . butalbital-acetaminophen-caffeine (FIORICET) 50-325-40 MG tablet Take 1 tablet by mouth every 6 (six) hours as needed for headache. TAKE (1) TABLET BY MOUTH EVERY SIX HOURS AS NEEDED. Max  10 per month. 10 tablet 4  . gabapentin (NEURONTIN) 300 MG capsule Take 2 capsules (600 mg total) by mouth 3 (three) times daily. 180 capsule 3  . levothyroxine (SYNTHROID, LEVOTHROID) 150 MCG tablet Take 150 mcg by mouth daily before breakfast.    . Multiple Vitamin (MULTIVITAMIN WITH MINERALS) TABS tablet Take 1 tablet by mouth at bedtime.    . ondansetron (ZOFRAN ODT) 4 MG disintegrating tablet Take 1-2 tablets (4 mg total) by mouth every 8 (eight) hours as needed (Patient taking differently: Take 4-8 mg by mouth every 8 (eight) hours as needed for nausea or vomiting. Take 1-2 tablets (4 mg total) by mouth every 8 (eight) hours as needed) 30 tablet 12  . ondansetron (ZOFRAN) 8 MG tablet Take 1 tablet (8 mg total) by mouth every 4 (four) hours as needed. 10 tablet 0  . pantoprazole (PROTONIX) 40 MG tablet Take 1 tablet (40 mg total) by mouth 2 (two) times daily before a meal. 60 tablet 3  . SUMAtriptan (IMITREX) 100 MG tablet TAKE 1 TABLET BY MOUTH EVERY 2 HOURS AS NEEDED FOR MIGRAINE. MAX 2 DOSES PER DAY. (Patient taking differently: Take 100 mg by mouth every 2 (two) hours as needed for migraine or headache. MAX 2 DOSES PER DAY.) 10 tablet 11  . SUMAtriptan 6  MG/0.5ML SOAJ INJECT 6MG  UNDER THE SKIN, MAY REPEAT IN 2 HOURS. MAX OF 2 IN 24 HOURS. (Patient taking differently: Inject 6 mg into the skin as needed (for migraine pain. *Max of 2 doses within 24 hours). ) 5 mL 5  . Topiramate (TOPAMAX PO) Take 100 mg by mouth 2 (two) times a day.    . valACYclovir (VALTREX) 1000 MG tablet Take 1 tablet (1,000 mg total) by mouth 2 (two) times daily. 20 tablet 0  . vitamin C (ASCORBIC ACID) 500 MG tablet Take 500 mg by mouth at bedtime.      No results found for this or any previous visit (from the past 48 hour(s)). No results found.  ROS  Temperature (!) 97.5 F (36.4 C), temperature source Oral, height 5' 7.5" (1.715 m), weight 67 kg. Physical Exam  Constitutional: She appears well-developed and  well-nourished.  HENT:  Mouth/Throat: Oropharynx is clear and moist.  Eyes: Conjunctivae are normal. No scleral icterus.  Neck: No thyromegaly present.  Cardiovascular: Normal rate, regular rhythm and normal heart sounds.  No murmur heard. Respiratory: Effort normal and breath sounds normal.  GI: Soft. She exhibits no distension and no mass. There is no abdominal tenderness.  Neurological: She is alert.  Skin: Skin is warm and dry.     Assessment/Plan Upper abdominal pain associate with nausea and weight loss. Work-up negative for gallbladder disease. Diagnostic EGD.  Hildred Laser, MD 06/13/2018, 2:04 PM

## 2018-06-13 NOTE — Anesthesia Procedure Notes (Signed)
Procedure Name: MAC Performed by: Adams, Amy A, CRNA Pre-anesthesia Checklist: Patient identified, Emergency Drugs available, Suction available, Timeout performed and Patient being monitored Patient Re-evaluated:Patient Re-evaluated prior to induction Oxygen Delivery Method: Nasal Cannula       

## 2018-06-20 ENCOUNTER — Encounter (HOSPITAL_COMMUNITY): Payer: Self-pay | Admitting: Internal Medicine

## 2018-06-30 NOTE — Progress Notes (Signed)
Virtual Visit via Video Note The purpose of this virtual visit is to provide medical care while limiting exposure to the novel coronavirus.    Consent was obtained for video visit:  Yes Answered questions that patient had about telehealth interaction:  Yes I discussed the limitations, risks, security and privacy concerns of performing an evaluation and management service by telemedicine. I also discussed with the patient that there may be a patient responsible charge related to this service. The patient expressed understanding and agreed to proceed.  Pt location: Home Physician Location: Home Name of referring provider:  Sinda Du, MD I connected with Britta Mccreedy Minus at patients initiation/request on 07/01/2018 at  1:30 PM EDT by video enabled telemedicine application and verified that I am speaking with the correct person using two identifiers. Pt MRN:  [id number] Pt DOB:  1970/02/09 Video Participants:  Britta Mccreedy Ryker   History of Present Illness:  Martha Aguilar is a 48 year old right-handed woman who follows up for migraines and occipital neuralgia.  UPDATE: We tried Lamictal but she stopped because she didn't notice a difference.  She had stem cell therapy and IV therapy for occipital neuralgia treatment.   Intensity:  Severe Duration:  2 to 3 days Frequency:  2 to 3 days a week Current NSAIDS:  None Current analgesics:  Fioricet (takes 10 tablets a month), Lidocaine 5% patch (for neck pain) Current triptans:  Sumatriptan 100mg  (gradual onset during day), sumatriptan 6mg  [place] (if wakes up with it) Current ergotamine:  None Current anti-emetic:  Zofran ODT 4mg  Current muscle relaxants:  baclofen 10mg  three times daily Current anti-anxiolytic:  alprazolam Current sleep aide:  alprazolam Current Antihypertensive medications:  None Current Antidepressant medications:  Amitriptyline 150mg  Current Anticonvulsant medications:  none Current anti-CGRP:  none Current  Vitamins/Herbal/Supplements:  Magnesium, B12, D, C, riboflavin, CoQ10, butterbur Current Antihistamines/Decongestants:  None Other therapy:  Botox Hormone/birth control:  Estrogen  Caffeine:  No coffee.  1 Coke a day Diet:  Drinks 24 to 36 oz water daily Depression:  yes; Anxiety:  yes Other pain:  No Sleep hygiene:  better  HISTORY: Onset:  48 years old   Location:  Middle of head, frontal Quality:  Pounding, pressure Initial intensity:  Severe.  She denies new headache, thunderclap headache Aura:  no Prodrome:  no Postdrome:  no Associated symptoms:  Photophobia, phonophobia.  She denies associated nausea, vomiting, visual disturbance/aura, autonomic symptoms, unilateral numbness or weakness. Initial Duration:  Within 3 hours with sumatriptan injection Initial Frequency:  Once a week Initial Frequency of abortive medication: every other day to every 2 days Triggers:  Wine, change in weather, fatigue Exacerbating factors:  movement Relieving factors:  Laying in dark and quiet room. Activity:  aggravates  She has associated neck pain that started on March 2019.  She also has bilateral occipital neuralgia described as burning sensation in back of head.  She underwent decompression excision of occipital nerves in October.  She takes Oxtella XR 600mg  twice daily.  She had to leave work Investment banker, corporate) due to the pain.    MRI of brain with and without contrast from 04/20/17 personally reviewed and was unremarkable.  MRI of cervical spine without contrast from 04/20/17 also personally reviewed and demonstrated unremarkable small disc protrusions at C5-6 and C6-7 but no explanation for headaches.  She was evaluated at [facility] and underwent a lumbar puncture on 07/11/17 which demonstrated an opening and closing pressure of 15 cm H2O.    Past NSAIDS:  Ibuprofen,  naproxen, Toradol Past steroid:  Prednisone Past analgesics:  Excedrin, tramadol, Norco, Percocet Past abortive triptans:  Sumatriptan  tablet/NS/injection, Relpax, Zomig tablet Past abortive ergotamine:  DHE capsule, DHE injection Past muscle relaxants:  Flexeril, tizanidine Past anti-emetic:  Reglan, Zofran, Compazine Past antihypertensive medications:  Atenolol, propranolol, lisinopril, candesartan, HCTZ Past antidepressant medications:  Venlafaxine, amitriptyline, nortriptyline, Cumbalta, Pristiq, Wellbutrin Past anticonvulsant medications:  Topiramate, Qudexy, Tegretol, zonisamide, Depakote, gabapentin, Oxtellar XR 600mg  twice daily (rash), oxcarbazepine, Dilantin, Keppra 1000mg  at bedtime, Lyrica, Lamictal Past anti-CGRP:  Aimovig, Ajovy, Emgality Past vitamins/Herbal/Supplements:  Feverfew Past antihistamines/decongestants:  none Other past therapies:  greater/lesser occipital nerve blocks/auriculotemporal nerve block/supraorbital nerve block, memantine  Family history of headache:  Mom had migraines when younger  Past Medical History: Past Medical History:  Diagnosis Date  . Headache(784.0)    migraines  . Hypothyroidism   . Migraines   . Occipital neuralgia     Medications: Outpatient Encounter Medications as of 07/01/2018  Medication Sig  . ALPRAZolam (XANAX) 1 MG tablet Take 1 mg by mouth at bedtime.  Marland Kitchen amitriptyline (ELAVIL) 50 MG tablet Take 3 tablets (150 mg total) by mouth at bedtime.  . butalbital-acetaminophen-caffeine (FIORICET) 50-325-40 MG tablet Take 1 tablet by mouth every 6 (six) hours as needed for headache. TAKE (1) TABLET BY MOUTH EVERY SIX HOURS AS NEEDED. Max 10 per month.  . gabapentin (NEURONTIN) 300 MG capsule Take 2 capsules (600 mg total) by mouth 3 (three) times daily.  Marland Kitchen levothyroxine (SYNTHROID, LEVOTHROID) 150 MCG tablet Take 150 mcg by mouth daily before breakfast.  . lidocaine (LIDODERM) 5 % Place 1 patch onto the skin daily. Remove & Discard patch within 12 hours or as directed by MD  . Multiple Vitamin (MULTIVITAMIN WITH MINERALS) TABS tablet Take 1 tablet by mouth at bedtime.   . ondansetron (ZOFRAN ODT) 4 MG disintegrating tablet Take 1-2 tablets (4 mg total) by mouth every 8 (eight) hours as needed (Patient taking differently: Take 4-8 mg by mouth every 8 (eight) hours as needed for nausea or vomiting. Take 1-2 tablets (4 mg total) by mouth every 8 (eight) hours as needed)  . ondansetron (ZOFRAN) 8 MG tablet Take 1 tablet (8 mg total) by mouth every 4 (four) hours as needed.  . pantoprazole (PROTONIX) 40 MG tablet Take 1 tablet (40 mg total) by mouth 2 (two) times daily before a meal.  . SUMAtriptan (IMITREX) 100 MG tablet TAKE 1 TABLET BY MOUTH EVERY 2 HOURS AS NEEDED FOR MIGRAINE. MAX 2 DOSES PER DAY. (Patient taking differently: Take 100 mg by mouth every 2 (two) hours as needed for migraine or headache. MAX 2 DOSES PER DAY.)  . SUMAtriptan 6 MG/0.5ML SOAJ INJECT 6MG  UNDER THE SKIN, MAY REPEAT IN 2 HOURS. MAX OF 2 IN 24 HOURS. (Patient taking differently: Inject 6 mg into the skin as needed (for migraine pain. *Max of 2 doses within 24 hours). )  . Topiramate (TOPAMAX PO) Take 100 mg by mouth 2 (two) times a day.  . valACYclovir (VALTREX) 1000 MG tablet Take 1 tablet (1,000 mg total) by mouth 2 (two) times daily.  . vitamin C (ASCORBIC ACID) 500 MG tablet Take 500 mg by mouth at bedtime.   No facility-administered encounter medications on file as of 07/01/2018.     Allergies: Allergies  Allergen Reactions  . Aimovig [Erenumab] Other (See Comments)    Burning in the back of the head, severe migraine  . Compazine [Prochlorperazine] Other (See Comments)    IV form, can  take PO  . Emgality [Galcanezumab-Gnlm] Other (See Comments)    Ineffective     Family History: Family History  Problem Relation Age of Onset  . Migraines Mother        benign small bowel tumor  . Prostate cancer Father   . Seizures Brother        MVA  . Migraines Other     Social History: Social History   Socioeconomic History  . Marital status: Divorced    Spouse name: Not on file   . Number of children: 1  . Years of education: 10  . Highest education level: Associate degree: academic program  Occupational History  . Occupation: Nurse  Social Needs  . Financial resource strain: Not on file  . Food insecurity    Worry: Not on file    Inability: Not on file  . Transportation needs    Medical: Not on file    Non-medical: Not on file  Tobacco Use  . Smoking status: Never Smoker  . Smokeless tobacco: Never Used  Substance and Sexual Activity  . Alcohol use: Yes    Comment: social  . Drug use: No  . Sexual activity: Not on file  Lifestyle  . Physical activity    Days per week: Not on file    Minutes per session: Not on file  . Stress: Not on file  Relationships  . Social Herbalist on phone: Not on file    Gets together: Not on file    Attends religious service: Not on file    Active member of club or organization: Not on file    Attends meetings of clubs or organizations: Not on file    Relationship status: Not on file  . Intimate partner violence    Fear of current or ex partner: Not on file    Emotionally abused: Not on file    Physically abused: Not on file    Forced sexual activity: Not on file  Other Topics Concern  . Not on file  Social History Narrative   Lives with daughter   Caffeine use: rare      Patient is right-handed. She lives in a 2 level home with her adopted daughter. She states she is dating her ex-husband. She drinks 2 cups of coffee a week and same amount of either tea or soda.   Observations/Objective:   Height 5' 7.75" (1.721 m), weight 140 lb (63.5 kg). No acute distress. Alert and oriented.  Speech fluent and not dysarthric.  Language intact.    Assessment and Plan: 1.  Chronic migraine without aura, without status migrainosus, not intractable 2.  Bilateral occipital neuralgia  1.  For migraine preventative management:  Botox, amitriptyline 2.  For abortive therapy, Ubrelvy.  Stop Fioricet and sumatriptan  as it is ineffective. 3. For occipital neuralgia management:  Baclofen and stem cell treatment 4.  Limit use of pain relievers to no more than 2 days out of week to prevent risk of rebound or medication-overuse headache. 5.  Keep headache diary 6.  Exercise, hydration, caffeine cessation, sleep hygiene, monitor for and avoid triggers 7.  Consider:  magnesium citrate 400mg  daily, riboflavin 400mg  daily, and coenzyme Q10 100mg  three times daily 8. Always keep in mind that currently taking a hormone or birth control may be a possible trigger or aggravating factor for migraine. 9. Follow up for next Botox  Follow Up Instructions:    -I discussed the assessment and treatment plan  with the patient. The patient was provided an opportunity to ask questions and all were answered. The patient agreed with the plan and demonstrated an understanding of the instructions.   The patient was advised to call back or seek an in-person evaluation if the symptoms worsen or if the condition fails to improve as anticipated.  Dudley Major, DO

## 2018-07-01 ENCOUNTER — Ambulatory Visit: Payer: BLUE CROSS/BLUE SHIELD | Admitting: Neurology

## 2018-07-01 ENCOUNTER — Encounter: Payer: Self-pay | Admitting: Neurology

## 2018-07-01 ENCOUNTER — Other Ambulatory Visit: Payer: Self-pay

## 2018-07-01 ENCOUNTER — Telehealth (INDEPENDENT_AMBULATORY_CARE_PROVIDER_SITE_OTHER): Payer: BC Managed Care – PPO | Admitting: Neurology

## 2018-07-01 VITALS — Ht 67.75 in | Wt 140.0 lb

## 2018-07-01 DIAGNOSIS — R519 Headache, unspecified: Secondary | ICD-10-CM

## 2018-07-01 DIAGNOSIS — G43011 Migraine without aura, intractable, with status migrainosus: Secondary | ICD-10-CM

## 2018-07-01 MED ORDER — UBRELVY 100 MG PO TABS: 1.0000 | ORAL_TABLET | ORAL | 3 refills | Status: DC | PRN

## 2018-07-03 NOTE — Progress Notes (Signed)
Ubrelvy 100mg prior authorization submitted via covermymeds.  Waiting response.  

## 2018-07-08 NOTE — Progress Notes (Addendum)
Rcvd approval from [facility]  reference #: [id number] Effective from 07/03/2018 through 09/24/2018. Pt notified via My Chart and advised to contact her pharmacy to fill Rx

## 2018-07-17 ENCOUNTER — Telehealth (INDEPENDENT_AMBULATORY_CARE_PROVIDER_SITE_OTHER): Payer: Self-pay | Admitting: Internal Medicine

## 2018-07-17 NOTE — Telephone Encounter (Signed)
Please call patient about some issues she has been having - ph# [phone]

## 2018-07-18 NOTE — Telephone Encounter (Signed)
Patient became constipated. Took laxative and enema. She did have a BM. Wants Dr. Laural Golden to consider a colonoscopy. I asked her to call back Tuesday and speak or leave Tammy a message concerning this.

## 2018-07-19 ENCOUNTER — Other Ambulatory Visit: Payer: Self-pay

## 2018-07-19 MED ORDER — AJOVY 225 MG/1.5ML ~~LOC~~ SOAJ: 225.0000 mg | Freq: Once | SUBCUTANEOUS | 0 refills | Status: AC

## 2018-07-22 ENCOUNTER — Other Ambulatory Visit: Payer: Self-pay | Admitting: Neurology

## 2018-07-23 ENCOUNTER — Telehealth: Payer: Self-pay | Admitting: Neurology

## 2018-07-23 MED ORDER — AMITRIPTYLINE HCL 150 MG PO TABS: 150.0000 mg | ORAL_TABLET | Freq: Every day | ORAL | 3 refills | Status: DC

## 2018-07-23 MED ORDER — AJOVY 225 MG/1.5ML ~~LOC~~ SOAJ: 225.0000 mg | SUBCUTANEOUS | 11 refills | Status: DC

## 2018-07-23 NOTE — Telephone Encounter (Signed)
Called and spoke with Pt. I apologized to her and explained that we were having IT issues and not receiving My Chart messages. It has now been resolved.  I provided her with an Ajovy co-pay card, she gave me permission to take pictures and text the info to her. [id number] PCN: [id number] GRP: [id number] ID#: [id number] Call [phone] to activate or [email or web address]  Amitriptyline Rx also sent in. Pt is aware tablet has changed from 50 mg to 150 mg, and she is to only take one tablet QHS.

## 2018-07-23 NOTE — Telephone Encounter (Signed)
New Message  Patient verbalized she sent a message to Katharine Look through MyChart twice and she has not heard back.  Patient would like to speak to [place].  Please f/u

## 2018-08-15 ENCOUNTER — Telehealth (INDEPENDENT_AMBULATORY_CARE_PROVIDER_SITE_OTHER): Payer: Self-pay | Admitting: Nurse Practitioner

## 2018-08-15 NOTE — Telephone Encounter (Signed)
I called patient and left a msg on her personal voicemail to call me back and we can discuss he stomach concerns

## 2018-08-15 NOTE — Telephone Encounter (Signed)
Patient called stated she had an EGD a couple of weeks ago and is still having stomach -issues - would like a call back at - [phone]

## 2018-08-20 ENCOUNTER — Other Ambulatory Visit: Payer: Self-pay

## 2018-08-20 DIAGNOSIS — M542 Cervicalgia: Secondary | ICD-10-CM

## 2018-08-20 DIAGNOSIS — R519 Headache, unspecified: Secondary | ICD-10-CM

## 2018-08-20 NOTE — Telephone Encounter (Signed)
Forwarded to [employer] -Smith,NP

## 2018-08-20 NOTE — Telephone Encounter (Signed)
Patient left message stating she just had an EGD and everything was ok - she is still have gas and bloating  -  Ph# [phone]

## 2018-08-21 ENCOUNTER — Other Ambulatory Visit: Payer: Self-pay | Admitting: Neurology

## 2018-08-21 MED ORDER — REYVOW 100 MG PO TABS: 1.0000 | ORAL_TABLET | ORAL | 1 refills | Status: DC | PRN

## 2018-08-22 NOTE — Progress Notes (Signed)
Initiated on Cover My Meds Key: [place]   Your information has been submitted to [place]. [employer] [place] will review the request and fax you a determination directly, typically within 3 business days of your submission once all necessary information is received. If [employer] [place] has not responded in 3 business days or if you have any questions about your submission, contact [place] at [phone].

## 2018-08-22 NOTE — Progress Notes (Signed)
Rcvd faxed approval from [facility] # [place]  08/22/18 - 08/21/2019

## 2018-08-22 NOTE — Telephone Encounter (Signed)
2nd call back to pt, left msg on her voicemail, she can try a bacteria probiotic ie: phillip's bacteria probiotic once daily for bloat, if this is a new sx prior to her June EGD then she should schedule an office appt.

## 2018-08-27 ENCOUNTER — Telehealth: Payer: Self-pay | Admitting: Neurology

## 2018-08-27 NOTE — Telephone Encounter (Signed)
Does patient need to cancel BOTOX appt and she was also asking about changing medication. She was wanting to stay on Ajovy and having burning in the back of her head. Thanks!

## 2018-08-28 NOTE — Telephone Encounter (Signed)
Called and spoke with Pt.   She states the purpose of her call was to change the appt on 09/06/18 to a virtual visit to discuss medications.  She is aware she is not to receive Botox now that she is on Ajovy. I advised her I will confirm this will be appropriate and notify her via My Chart.  I did advise her the appts on 09/06/18 are only 15 minutes in length.

## 2018-08-29 NOTE — Telephone Encounter (Signed)
Is this okay to change appt? Patient called in and asked. Thanks!

## 2018-08-30 NOTE — Telephone Encounter (Signed)
Dr Tomi Likens,  It looks like pt no longer requires Botox. There is an appt still for Botox scheduled on 8/28. Does the pt need a visit? Just a regular f/u?

## 2018-08-30 NOTE — Telephone Encounter (Signed)
Chelsea, I am not sure if this is a special appt slot since it was originally for Botox. Now the visit is just a regular VV for a follow up on medications. Will you please change? Thank you. I will inform pt.

## 2018-08-30 NOTE — Telephone Encounter (Signed)
Yes, it can be changed to a regular office virtual visit.

## 2018-09-03 DIAGNOSIS — E039 Hypothyroidism, unspecified: Secondary | ICD-10-CM | POA: Diagnosis not present

## 2018-09-03 DIAGNOSIS — G43909 Migraine, unspecified, not intractable, without status migrainosus: Secondary | ICD-10-CM | POA: Diagnosis not present

## 2018-09-03 DIAGNOSIS — F419 Anxiety disorder, unspecified: Secondary | ICD-10-CM | POA: Diagnosis not present

## 2018-09-03 DIAGNOSIS — F321 Major depressive disorder, single episode, moderate: Secondary | ICD-10-CM | POA: Diagnosis not present

## 2018-09-05 ENCOUNTER — Other Ambulatory Visit: Payer: Self-pay

## 2018-09-05 ENCOUNTER — Ambulatory Visit (INDEPENDENT_AMBULATORY_CARE_PROVIDER_SITE_OTHER): Payer: BC Managed Care – PPO | Admitting: Nurse Practitioner

## 2018-09-05 ENCOUNTER — Encounter (INDEPENDENT_AMBULATORY_CARE_PROVIDER_SITE_OTHER): Payer: Self-pay | Admitting: Nurse Practitioner

## 2018-09-05 VITALS — BP 172/104 | HR 84 | Temp 97.5°F | Ht 67.0 in | Wt 148.1 lb

## 2018-09-05 DIAGNOSIS — R140 Abdominal distension (gaseous): Secondary | ICD-10-CM | POA: Diagnosis not present

## 2018-09-05 DIAGNOSIS — K5909 Other constipation: Secondary | ICD-10-CM

## 2018-09-05 DIAGNOSIS — K5900 Constipation, unspecified: Secondary | ICD-10-CM | POA: Diagnosis not present

## 2018-09-05 HISTORY — DX: Constipation, unspecified: K59.00

## 2018-09-05 MED ORDER — LINACLOTIDE 145 MCG PO CAPS: 145.0000 ug | ORAL_CAPSULE | Freq: Every day | ORAL | 0 refills | Status: DC

## 2018-09-05 NOTE — Progress Notes (Signed)
Subjective:    Patient ID: Martha Aguilar, female    DOB: 11/14/1970, 47 y.o.   MRN: [id number]  HPI  Martha Aguilar presents today for further evaluation regarding abdominal bloat and constipation. She had nausea with abdominal pain x 2 months. Described pain as an electric shock across her abdomen. Gallbladder evaluation was negative. She underwent an EGD 06/13/2018 which was normal. She is taking Pantoprazole 40mg  once daily. No dysphagia, infrequent heartburn. She describes having generalized abdominal bloat discomfort, more prominent to the upper abdomen. No current abdominal pain. She can go 5-6 days without passing a BM. She is taking a Dulcolax suppository twice weekly which results in passing a moderate amount of loose stool with associated abdominal cramping. No rectal bleeding. Maternal aunt with history of colon cancer. She has lost 10 to 15 lbs over the past 3 months. No fever, sweats or chills. She is scheduled to see her gyn next week for her annual exam.   Her BP is elevated, she attributes to having due to occipital neuralgia pain  Abdominal sonogram 05/15/2018: normal Hida scan 05/31/2018: normal   Labs 06/04/2018: LFTs normal. Lipase mildly elevated 66. WBC 7.4. Hg 14.8. HCT 43.9. Past Medical History:  Diagnosis Date  . Headache(784.0)    migraines  . Hypothyroidism   . Migraines   . Occipital neuralgia    Past Surgical History:  Procedure Laterality Date  . ESOPHAGOGASTRODUODENOSCOPY (EGD) WITH PROPOFOL N/A 06/13/2018   Procedure: ESOPHAGOGASTRODUODENOSCOPY (EGD) WITH PROPOFOL;  Surgeon: Rogene Houston, MD;  Location: [facility];  Service: Endoscopy;  Laterality: N/A;  . TUBAL LIGATION    . uterine ablation    . uterine polyps     Current Outpatient Medications on File Prior to Visit  Medication Sig Dispense Refill  . ALPRAZolam (XANAX) 1 MG tablet Take 1 mg by mouth at bedtime.  5  . amitriptyline (ELAVIL) 150 MG tablet Take 1 tablet (150 mg total) by mouth  at bedtime. 30 tablet 3  . amitriptyline (ELAVIL) 50 MG tablet Take 3 tablets (150 mg total) by mouth at bedtime. 90 tablet 11  . Fremanezumab-vfrm (AJOVY) 225 MG/1.5ML SOAJ Inject 225 mg into the skin every 30 (thirty) days. 1 pen 11  . gabapentin (NEURONTIN) 300 MG capsule Take 2 capsules (600 mg total) by mouth 3 (three) times daily. 180 capsule 3  . Lasmiditan Succinate (REYVOW) 100 MG TABS Take 1 tablet by mouth as needed (Maximum 1 tablet in 24 hours.). 16 tablet 1  . levothyroxine (SYNTHROID, LEVOTHROID) 150 MCG tablet Take 150 mcg by mouth daily before breakfast.    . lidocaine (LIDODERM) 5 % Place 1 patch onto the skin daily. Remove & Discard patch within 12 hours or as directed by MD 28 patch 1  . Multiple Vitamin (MULTIVITAMIN WITH MINERALS) TABS tablet Take 1 tablet by mouth at bedtime.    . ondansetron (ZOFRAN ODT) 4 MG disintegrating tablet Take 1-2 tablets (4 mg total) by mouth every 8 (eight) hours as needed (Patient taking differently: Take 4-8 mg by mouth every 8 (eight) hours as needed for nausea or vomiting. Take 1-2 tablets (4 mg total) by mouth every 8 (eight) hours as needed) 30 tablet 12  . ondansetron (ZOFRAN) 8 MG tablet Take 1 tablet (8 mg total) by mouth every 4 (four) hours as needed. 10 tablet 0  . pantoprazole (PROTONIX) 40 MG tablet Take 1 tablet (40 mg total) by mouth 2 (two) times daily before a meal. 60 tablet  3  . Topiramate (TOPAMAX PO) Take 100 mg by mouth 2 (two) times a day.    Marland Kitchen Ubrogepant (UBRELVY) 100 MG TABS Take 1 tablet by mouth as needed (May repeat dose after 2 hours if needed.  Maximum 2 tablets in 24 hours). 16 tablet 3  . valACYclovir (VALTREX) 1000 MG tablet Take 1 tablet (1,000 mg total) by mouth 2 (two) times daily. 20 tablet 0  . vitamin C (ASCORBIC ACID) 500 MG tablet Take 500 mg by mouth at bedtime.     No current facility-administered medications on file prior to visit.    Allergies  Allergen Reactions  . Aimovig [Erenumab] Other (See  Comments)    Burning in the back of the head, severe migraine  . Other Rash    Oxtellar  Oxcarbazepine   . Compazine [Prochlorperazine] Other (See Comments)    IV form, can take PO  . Emgality [Galcanezumab-Gnlm] Other (See Comments)    Ineffective      Review of Systems See HPI, all other systems reviewed and are negative      Objective:   Physical Exam  Blood pressure (!) 172/104, pulse 84, temperature (!) 97.5 F (36.4 C), height 5\' 7"  (1.702 m), weight 148 lb 1.6 oz (67.2 kg).   General: 48 year old female in NAD Eyes: sclera nonicteric, conjunctiva pink Mouth: dentition intact, no ulcers Neck: supple, no thyromegaly or lymphadenopathy  Heart: RRR, no murmurs Lungs: clear throughout Abdomen: soft, nontender, no masses or organomegaly  Extremities: no edema Neuro: alert and oriented x 4, no focal deficits     Assessment & Plan:   77. 48 year old female with constipation, abdominal bloat  -trial with Linzess 139mcg one tab to be taken 30 minutes before breakfast -schedule a colonoscopy as patient is 48 yrs old, change of bowel pattern, + family history of colon cancer, colonoscopy benefits and risks discussed  -proceed with gyn exam next week, recommend pelvic sono to evaluate the ovaries -celiac panel  -Probiotic of choice once daily -If abdominal bloat worsens or abdominal pain recurs abd/pelvic CT will be ordered   2. Family hx of colon cancer -schedule a colonoscopy

## 2018-09-05 NOTE — Patient Instructions (Addendum)
1. Start Linzess 180mcg one capsule by mouth once daily to be taken 30 minutes before breakfast   2. Probiotic of choice once daily   3. Call Kolson Chovanec in 2 weeks with update   4. Proceed with gyn evaluation as scheduled

## 2018-09-05 NOTE — Progress Notes (Signed)
Virtual Visit via Video Note The purpose of this virtual visit is to provide medical care while limiting exposure to the novel coronavirus.    Consent was obtained for video visit:  Yes.   Answered questions that patient had about telehealth interaction:  Yes.   I discussed the limitations, risks, security and privacy concerns of performing an evaluation and management service by telemedicine. I also discussed with the patient that there may be a patient responsible charge related to this service. The patient expressed understanding and agreed to proceed.  Pt location: Home Physician Location: office Name of referring provider:  Sinda Du, MD I connected with Martha Aguilar at patients initiation/request on 09/06/2018 at  1:30 PM EDT by video enabled telemedicine application and verified that I am speaking with the correct person using two identifiers. Pt MRN:  [id number] Pt DOB:  1970-12-07 Video Participants:  Martha Aguilar   History of Present Illness:  Martha Aguilar is a 48 year old right-handed woman who follows up for migraines and occipital neuralgia.  UPDATE: Wished to stop Botox and instead try Ajovy.  She has been on it for 2 months.  No improvement.   Melburn Hake, but ineffective.  Tried Reyvow but it was also ineffective.  She restarted Lamictal and Lyrica on her own.  She also notes a rash on her lips.    Intensity:  Severe Duration:  2 to 3 days Frequency:  2 to 3 days a week Current NSAIDS:None Still with burning in left occipital region. Current analgesics: Lidocaine 5% patch (for neck pain) Current triptans:Sumatriptan 100mg (gradual onset during day), sumatriptan 6mg  [place](if wakes up with it) Current ergotamine:None Other current abortive:  Reyvow Current anti-emetic:Zofran ODT 4mg  Current muscle relaxants:baclofen 10mg  three times daily Current anti-anxiolytic:alprazolam Current sleep aide:alprazolam Current Antihypertensive  medications:None Current Antidepressant medications:Amitriptyline 150mg  Current Anticonvulsant medications: Lamictal 25mg  at bedtime, gabapentin 600mg  three times daily, Lyrica 100mg  three times daily. Current anti-CGRP:Ajovy Current Vitamins/Herbal/Supplements:Magnesium, B12, D, C, riboflavin, CoQ10, butterbur Current Antihistamines/Decongestants:None Other therapy:Botox, stem cell therapy and IV therapy (for occipital neuralgia) Hormone/birth control: Estrogen  Caffeine:No coffee. 1 Coke a day Diet:Drinks 24 to 36 oz water daily Depression:yes; Anxiety:yes Other pain:No Sleep hygiene:better  HISTORY: Onset:  48 years old  Location:Middle of head,frontal Quality:Pounding, pressure Initial intensity:Severe.Shedenies new headache, thunderclap headache Aura:no Prodrome:no Postdrome:no Associated symptoms:  Photophobia, phonophobia.Shedenies associated nausea, vomiting, visual disturbance/aura, autonomic symptoms,unilateral numbness or weakness. Initial Duration:Within 3 hours with sumatriptan injection Initial Frequency:Once a week Initial Frequency of abortive medication:every other day to every 2 days Triggers:  Wine, change in weather, fatigue Exacerbating factors: movement Relieving factors:  Laying in dark and quiet room. Activity:aggravates  She has associated neck painthat started on March 2019. She also has bilateral occipital neuralgia described as burning sensation in back of head. She underwent decompression excision of occipital nerves in October. She takes Oxtella XR 600mg  twice daily. She had to leave work Investment banker, corporate) due to the pain.   MRI of brain with and without contrast from 04/20/17 personally reviewed and was unremarkable. MRI of cervical spine without contrast from 04/20/17 also personally reviewed and demonstrated unremarkable small disc protrusions at C5-6 and C6-7 but no explanation for headaches. She  was evaluated at [facility] and underwent a lumbar puncture on 07/11/17 which demonstrated an opening and closing pressure of 15 cm H2O.   Past NSAIDS:Ibuprofen, naproxen, Toradol Past steroid: Prednisone Past analgesics:Excedrin, tramadol, Norco, Percocet, Fioricet Past abortive triptans:Sumatriptan tablet/NS/injection, Relpax, Zomig tablet Other past abortive:  Reyvow (  ineffective) Past abortive ergotamine:DHE capsule, DHE injection Past muscle relaxants:Flexeril, tizanidine Past anti-emetic:Reglan, Zofran, Compazine Past antihypertensive medications:Atenolol, propranolol, lisinopril, candesartan, HCTZ Past antidepressant medications:Venlafaxine, nortriptyline, Pristiq, Wellbutrin Past anticonvulsant medications:Topiramate, Qudexy, Tegretol, zonisamide, Depakote, gabapentin, Oxtellar XR 600mg  twice daily (rash), oxcarbazepine, Dilantin, Keppra 1000mg  at bedtime, Lamictal Past anti-CGRP:Aimovig, Roselyn Meier Past vitamins/Herbal/Supplements:Feverfew Past antihistamines/decongestants:none Other past therapies:Botox, greater/lesser occipital nerve blocks/auriculotemporal nerve block/supraorbital nerve block, memantine  Family history of headache:Mom had migraines when younger  Past Medical History: Past Medical History:  Diagnosis Date  . Headache(784.0)    migraines  . Hypothyroidism   . Migraines   . Occipital neuralgia     Medications: Outpatient Encounter Medications as of 09/06/2018  Medication Sig  . ALPRAZolam (XANAX) 1 MG tablet Take 1 mg by mouth at bedtime.  Marland Kitchen amitriptyline (ELAVIL) 150 MG tablet Take 1 tablet (150 mg total) by mouth at bedtime.  Marland Kitchen amitriptyline (ELAVIL) 50 MG tablet Take 3 tablets (150 mg total) by mouth at bedtime.  . Fremanezumab-vfrm (AJOVY) 225 MG/1.5ML SOAJ Inject 225 mg into the skin every 30 (thirty) days.  Marland Kitchen gabapentin (NEURONTIN) 300 MG capsule Take 2 capsules (600 mg total) by mouth 3 (three) times daily.  [place]  Succinate (REYVOW) 100 MG TABS Take 1 tablet by mouth as needed (Maximum 1 tablet in 24 hours.).  Marland Kitchen levothyroxine (SYNTHROID, LEVOTHROID) 150 MCG tablet Take 150 mcg by mouth daily before breakfast.  . lidocaine (LIDODERM) 5 % Place 1 patch onto the skin daily. Remove & Discard patch within 12 hours or as directed by MD  . Multiple Vitamin (MULTIVITAMIN WITH MINERALS) TABS tablet Take 1 tablet by mouth at bedtime.  . ondansetron (ZOFRAN ODT) 4 MG disintegrating tablet Take 1-2 tablets (4 mg total) by mouth every 8 (eight) hours as needed (Patient taking differently: Take 4-8 mg by mouth every 8 (eight) hours as needed for nausea or vomiting. Take 1-2 tablets (4 mg total) by mouth every 8 (eight) hours as needed)  . ondansetron (ZOFRAN) 8 MG tablet Take 1 tablet (8 mg total) by mouth every 4 (four) hours as needed.  . pantoprazole (PROTONIX) 40 MG tablet Take 1 tablet (40 mg total) by mouth 2 (two) times daily before a meal.  . Topiramate (TOPAMAX PO) Take 100 mg by mouth 2 (two) times a day.  Marland Kitchen Ubrogepant (UBRELVY) 100 MG TABS Take 1 tablet by mouth as needed (May repeat dose after 2 hours if needed.  Maximum 2 tablets in 24 hours).  . valACYclovir (VALTREX) 1000 MG tablet Take 1 tablet (1,000 mg total) by mouth 2 (two) times daily.  . vitamin C (ASCORBIC ACID) 500 MG tablet Take 500 mg by mouth at bedtime.   No facility-administered encounter medications on file as of 09/06/2018.     Allergies: Allergies  Allergen Reactions  . Aimovig [Erenumab] Other (See Comments)    Burning in the back of the head, severe migraine  . Other Rash    Oxtellar  Oxcarbazepine   . Compazine [Prochlorperazine] Other (See Comments)    IV form, can take PO  . Emgality [Galcanezumab-Gnlm] Other (See Comments)    Ineffective     Family History: Family History  Problem Relation Age of Onset  . Migraines Mother        benign small bowel tumor  . Prostate cancer Father   . Seizures Brother        MVA  .  Migraines Other     Social History: Social History   Socioeconomic History  .  Marital status: Divorced    Spouse name: Not on file  . Number of children: 1  . Years of education: 57  . Highest education level: Associate degree: academic program  Occupational History  . Occupation: Nurse  Social Needs  . Financial resource strain: Not on file  . Food insecurity    Worry: Not on file    Inability: Not on file  . Transportation needs    Medical: Not on file    Non-medical: Not on file  Tobacco Use  . Smoking status: Never Smoker  . Smokeless tobacco: Never Used  Substance and Sexual Activity  . Alcohol use: Yes    Comment: social  . Drug use: No  . Sexual activity: Not on file  Lifestyle  . Physical activity    Days per week: Not on file    Minutes per session: Not on file  . Stress: Not on file  Relationships  . Social Herbalist on phone: Not on file    Gets together: Not on file    Attends religious service: Not on file    Active member of club or organization: Not on file    Attends meetings of clubs or organizations: Not on file    Relationship status: Not on file  . Intimate partner violence    Fear of current or ex partner: Not on file    Emotionally abused: Not on file    Physically abused: Not on file    Forced sexual activity: Not on file  Other Topics Concern  . Not on file  Social History Narrative   Lives with daughter   Caffeine use: rare      Patient is right-handed. She lives in a 2 level home with her adopted daughter. She states she is dating her ex-husband. She drinks 2 cups of coffee a week and same amount of either tea or soda.    Observations/Objective:   Blood pressure (!) 178/102, height 5\' 7"  (1.702 m), weight 143 lb (64.9 kg). No acute distress.  Alert and oriented.  Speech fluent and not dysarthric.  Language intact.  Eyes orthophoric on primary gaze.  Face symmetric.  Assessment and Plan:   1.  Chronic migraine without  aura, without status migrainosus, intractable 2.  Bilateral occipital neuralgia  1.  For preventative management, she will try Emgality.  She previously had one injection and it was stopped for some reason (allergic reaction mentioned in her chart but she denies this) 2.  For abortive therapy, Nurtec. 3.  For neuralgia, start Cymbalta 30mg  daily for one week, then increase to 60mg  daily.  She will stop Lamictal and Lyrica (Lamictal may be causing a rash anyway) 4.  Limit use of pain relievers to no more than 2 days out of week to prevent risk of rebound or medication-overuse headache. 5.  Keep headache diary 6.  Exercise, hydration, caffeine cessation, sleep hygiene, monitor for and avoid triggers 7.  Consider:  magnesium citrate 400mg  daily, riboflavin 400mg  daily, and coenzyme Q10 100mg  three times daily 8. Always keep in mind that currently taking a hormone or birth control may be a possible trigger or aggravating factor for migraine. 9. Follow up with PCP regarding blood pressure 10. Follow up 4 months.   Follow Up Instructions:    -I discussed the assessment and treatment plan with the patient. The patient was provided an opportunity to ask questions and all were answered. The patient agreed with the plan and demonstrated  an understanding of the instructions.   The patient was advised to call back or seek an in-person evaluation if the symptoms worsen or if the condition fails to improve as anticipated.   Dudley Major, DO

## 2018-09-06 ENCOUNTER — Encounter: Payer: Self-pay | Admitting: *Deleted

## 2018-09-06 ENCOUNTER — Telehealth (INDEPENDENT_AMBULATORY_CARE_PROVIDER_SITE_OTHER): Payer: BC Managed Care – PPO | Admitting: Neurology

## 2018-09-06 ENCOUNTER — Encounter: Payer: Self-pay | Admitting: Neurology

## 2018-09-06 VITALS — BP 178/102 | Ht 67.0 in | Wt 143.0 lb

## 2018-09-06 DIAGNOSIS — R030 Elevated blood-pressure reading, without diagnosis of hypertension: Secondary | ICD-10-CM

## 2018-09-06 DIAGNOSIS — G43709 Chronic migraine without aura, not intractable, without status migrainosus: Secondary | ICD-10-CM | POA: Diagnosis not present

## 2018-09-06 DIAGNOSIS — M5481 Occipital neuralgia: Secondary | ICD-10-CM

## 2018-09-06 MED ORDER — EMGALITY 120 MG/ML ~~LOC~~ SOAJ: 240.0000 mg | Freq: Once | SUBCUTANEOUS | 0 refills | Status: AC

## 2018-09-06 MED ORDER — NURTEC 75 MG PO TBDP: 1.0000 | ORAL_TABLET | Freq: Every day | ORAL | 3 refills | Status: DC | PRN

## 2018-09-06 MED ORDER — DULOXETINE HCL 30 MG PO CPEP: ORAL_CAPSULE | ORAL | 0 refills | Status: DC

## 2018-09-06 MED ORDER — EMGALITY 120 MG/ML ~~LOC~~ SOAJ: 240.0000 mg | Freq: Once | SUBCUTANEOUS | 0 refills | Status: DC

## 2018-09-06 MED ORDER — EMGALITY 120 MG/ML ~~LOC~~ SOAJ: 120.0000 mg | SUBCUTANEOUS | 11 refills | Status: DC

## 2018-09-06 NOTE — Progress Notes (Addendum)
Njeri Hagwood (Key: [id number]) Rx #[id number] Nurtec 75MG  dispersible tablets   Form [employer] [place] Commercial Electronic Request Form (CB) Created 5 days ago Sent to Plan 5 days ago Plan Response 5 days ago Submit Clinical Questions 5 days ago Determination Favorable 2 days ago Message from Plan Effective from 09/06/2018 through 11/28/2018.

## 2018-09-06 NOTE — Progress Notes (Addendum)
Martha Aguilar (Key: [id number]) Emgality 120MG /ML auto-injectors (migraine)   Form Blue Building control surveyor Form (CB) Created 5 days ago Sent to Plan 5 days ago Plan Response 5 days ago Submit Clinical Questions 5 days ago Determination Favorable 1 day ago Message from Plan Effective from 09/06/2018 through 12/04/2018. Initial and continuation Approval notice was sent to scan

## 2018-09-09 ENCOUNTER — Ambulatory Visit (HOSPITAL_COMMUNITY): Payer: BC Managed Care – PPO

## 2018-09-09 ENCOUNTER — Telehealth (HOSPITAL_COMMUNITY): Payer: Self-pay

## 2018-09-09 ENCOUNTER — Telehealth (INDEPENDENT_AMBULATORY_CARE_PROVIDER_SITE_OTHER): Payer: Self-pay | Admitting: *Deleted

## 2018-09-09 ENCOUNTER — Encounter (INDEPENDENT_AMBULATORY_CARE_PROVIDER_SITE_OTHER): Payer: Self-pay | Admitting: *Deleted

## 2018-09-09 MED ORDER — SUPREP BOWEL PREP KIT 17.5-3.13-1.6 GM/177ML PO SOLN: 1.0000 | Freq: Once | ORAL | 0 refills | Status: AC

## 2018-09-09 NOTE — Telephone Encounter (Signed)
Patient needs suprep TCS sch'd 10/23/18

## 2018-09-09 NOTE — Addendum Note (Signed)
Addended by: Noralyn Pick on: 09/09/2018 10:56 AM   Modules accepted: Orders, SmartSet

## 2018-09-09 NOTE — Telephone Encounter (Signed)
pt cancelled this appt due to she has a migrane headache.

## 2018-09-11 ENCOUNTER — Ambulatory Visit (HOSPITAL_COMMUNITY): Payer: BC Managed Care – PPO

## 2018-09-11 ENCOUNTER — Ambulatory Visit (INDEPENDENT_AMBULATORY_CARE_PROVIDER_SITE_OTHER): Payer: BC Managed Care – PPO | Admitting: Nurse Practitioner

## 2018-09-12 ENCOUNTER — Telehealth: Payer: Self-pay | Admitting: Neurology

## 2018-09-12 NOTE — Telephone Encounter (Signed)
Patient called and left a voice mail that she has received a message through MyChart about a copay card for [employer].   I returned the patient's call and left a message to call back with instructions as to whether she wants to pick it up or have it mailed.

## 2018-09-17 ENCOUNTER — Other Ambulatory Visit: Payer: Self-pay

## 2018-09-17 ENCOUNTER — Ambulatory Visit (HOSPITAL_COMMUNITY): Payer: BC Managed Care – PPO | Attending: Neurology

## 2018-09-17 ENCOUNTER — Encounter (HOSPITAL_COMMUNITY): Payer: Self-pay

## 2018-09-17 DIAGNOSIS — R51 Headache: Secondary | ICD-10-CM | POA: Diagnosis not present

## 2018-09-17 DIAGNOSIS — G8929 Other chronic pain: Secondary | ICD-10-CM

## 2018-09-17 DIAGNOSIS — M542 Cervicalgia: Secondary | ICD-10-CM

## 2018-09-17 DIAGNOSIS — R519 Headache, unspecified: Secondary | ICD-10-CM

## 2018-09-17 NOTE — Patient Instructions (Signed)
Access Code: [email or web address]  URL: [email or web address]  Date: 09/17/2018  Prepared by: Yetta Glassman   Exercises Supine Chin Tuck - 15 reps - 1 sets - 5 hold - 2x daily - 7x weekly Supine Suboccipital Release with Tennis Balls - 1x daily - 7x weekly

## 2018-09-17 NOTE — Telephone Encounter (Signed)
Patient is calling back she would like a emgality copay card she would like one mailed to her

## 2018-09-17 NOTE — Therapy (Signed)
[place], [place], [address] Phone: [phone]   Fax:  [phone]  Physical Therapy Evaluation  Patient Details  Name: Martha Aguilar MRN: [id number] Date of Birth: 1970-07-21 Referring Provider (PT): Metta Clines   Encounter Date: 09/17/2018  PT End of Session - 09/17/18 1724    Visit Number  1    Number of Visits  12    Date for PT Re-Evaluation  10/08/18    Authorization Type  [employer] comm ppo    Authorization Time Period  09/17/18 to 10/29/18    Authorization - Visit Number  1    Authorization - Number of Visits  30    PT Start Time  [id number]    PT Stop Time  1533    PT Time Calculation (min)  48 min    Activity Tolerance  Patient tolerated treatment well    Behavior During Therapy  [facility] for tasks assessed/performed       Past Medical History:  Diagnosis Date  . Headache(784.0)    migraines  . Hypothyroidism   . Migraines   . Occipital neuralgia     Past Surgical History:  Procedure Laterality Date  . ESOPHAGOGASTRODUODENOSCOPY (EGD) WITH PROPOFOL N/A 06/13/2018   Procedure: ESOPHAGOGASTRODUODENOSCOPY (EGD) WITH PROPOFOL;  Surgeon: Rogene Houston, MD;  Location: [facility];  Service: Endoscopy;  Laterality: N/A;  . TUBAL LIGATION    . uterine ablation    . uterine polyps      There were no vitals filed for this visit.   Subjective Assessment - 09/17/18 1441    Subjective  Patient complains of chronic migraines/headaches since she was 17. States that about a year and a half ago she was working as a Marine scientist doing case management work where she had to sit slumped for hours on end and one morning she awoke with terrible neck pain. States she has seen 6 different neurologists and she was finally diagnosed with occipital neuralgia. States that she has had botox, multiple prescription drugs (mostly anti-seizure drugs), and the only thing that helped with her pain was a drug she ended up being allergic to.  States she eventually traveled to [place] where they performed an excision and decompression surgery, this helped with her neck pain but her migraines and burning head symptoms continued. She was going to have an additional surgery by the same doctor, but recently had a stem cell injection that helped take the edge off of her symptoms that she decided to put off the surgery. Patient reports she is apart of a support group for occipital neuralgia and that they suggested physical therapy. States she usually wears an ice pack around the clock  to help keep her symptoms at [place]. States she has an 50 year old and she would love to be more active with her. States she can get by and do house work but it's still very challenging and painful. States she hasn't worked in over a year because of her pain and she wants nothing but to get back to work as a Marine scientist.    Limitations  Lifting;House hold activities;Other (comment)   performing job tasks as nurse   Patient Stated Goals  To get back to work, to be able to keep up with her 70 year old daughter    Currently in Pain?  Yes    Pain Score  6     Pain Location  Neck    Pain Orientation  Posterior;Right    Pain Descriptors / Indicators  Burning    Pain Type  Chronic pain    Pain Radiating Towards  towards back of head and right side of head    Pain Onset  More than a month ago    Pain Frequency  Constant    Aggravating Factors   light and sound sensitivity, sitting, cervical ROM    Pain Relieving Factors  ice, dark, quiet    Multiple Pain Sites  No         [facility] PT Assessment - 09/17/18 0001      Assessment   Medical Diagnosis  occipital headaches    Referring Provider (PT)  Metta Clines    Prior Therapy  no      Precautions   Precautions  None      Restrictions   Weight Bearing Restrictions  No      Home Environment   Living Environment  Private residence    Living Arrangements  Children      Prior Function   Level of Independence   Independent      Cognition   Overall Cognitive Status  Within Functional Limits for tasks assessed      Posture/Postural Control   Posture Comments  forward head      ROM / Strength   AROM / PROM / Strength  AROM      AROM   Overall AROM Comments  WFL in bilateral shoulders, no symptoms with shoulder ROM    AROM Assessment Site  Shoulder;Cervical;Thoracic    Right/Left Shoulder  Right;Left    Cervical Flexion  WFL   pain at end ranges and with overpressure   Cervical Extension  WFL   pain at end ranges and with overpressure   Cervical - Right Side Bend  25% limited    pain with ROM    Cervical - Left Side Bend  WFL   pain at end ranges and with overpressure   Cervical - Right Rotation  WFL   pain at end ranges and with overpressure   Cervical - Left Rotation  WFL   pain at end ranges and with overpressure     Palpation   Spinal mobility  C2-6 hypomobile with R medial glides- C2-4 reproduced burning symptoms on right side of occiput. C2-5 hypomobile with R AP mobilization- painful but felt like it relieved some of her neck symptoms.    Palpation comment  Tenderness to palpation: B-SCM, suboccipitals, cervical paraspinals. Right- anterior scalenes, levator, UT.        Objective measurements completed on examination: See above findings.      [facility] PT Treatment/Exercise - 09/17/18 0001      Exercises   Exercises  Neck      Neck Exercises: Supine   Other Supine Exercise  self mobilization with 2 tennis balls to upper cervical region - 3 minutes    Other Supine Exercise  supine chin tucks "bobble head" x15      Manual Therapy   Manual Therapy  Joint mobilization;Manual Traction;Soft tissue mobilization    Joint Mobilization  supine: C2-4 R medial glide grade II for pain - tolerated well    Soft tissue mobilization  STM to Bil suboccipitals, right cervical paraspinals - supine    Manual Traction  suboccipital traction - 3x30 second bouts- felt good. Regular cervical  traction - no change in symptoms             PT Education - 09/17/18  [id number]    Education Details  current condition and treatment options, dry needling, rehab interventions and plan moving forward    Person(s) Educated  Patient    Methods  Explanation;Demonstration    Comprehension  Verbalized understanding       PT Short Term Goals - 09/17/18 1731      PT SHORT TERM GOAL #1   Title  Patient will be independent in HEP to improve functional outcomes.    Baseline  estabilshed today    Time  3    Period  Weeks    Status  New    Target Date  10/08/18      PT SHORT TERM GOAL #2   Title  Patient will report at least 25% reduction in overall burning symptoms on the right side of her head to improve QOL.    Baseline  0%    Time  3    Period  Weeks    Status  New    Target Date  10/08/18      PT SHORT TERM GOAL #3   Title  Patient will report at least 25% reduction in either the frequency or intensity of headache symptoms to improve QOL.    Baseline  0%    Time  3    Period  Weeks    Status  New    Target Date  10/08/18        PT Long Term Goals - 09/17/18 1733      PT LONG TERM GOAL #1   Title  Patient will report at least 50% reduction in overall burning symptoms on the right side of her head to improve QOL.    Baseline  0%    Time  6    Period  Weeks    Status  New    Target Date  10/29/18      PT LONG TERM GOAL #2   Title  Patient will report at least 50% reduction in either the frequency or intensity of headache symptoms to improve QOL.    Baseline  0%    Time  6    Period  Weeks    Status  New    Target Date  10/29/18      PT LONG TERM GOAL #3   Title  Patient will score with a 41% or less limitation on foto outcome measure to indicate significant reduction in self reported limitation with functional activities.    Baseline  49% limitation    Time  6    Period  Weeks    Status  New    Target Date  10/29/18             Plan - 09/17/18 1726     Clinical Impression Statement  Patient presents to clinic with chronic neck pain and headaches that keep her from going to work and taking care of her 21-year-old daughter. Previous treatment interventions have included surgery, prescription drugs, cryotherapy, chiropractic care, botox injections and stem cell injections. Patient very motivated to improve overall functional status and return to work. Patient notably upset during today's session when asked about current functional status. Focused on cervical assessment on this date. Hypomobilities noted in cervical spine that reproduced burning symptoms on the right side of her head and her cervical pain. Patient responded well to today's session reporting less burning pain, less cervical pain and improved relaxation/mobility end of session. Did not assess thoracic spine during today's session secondary to time limitations,  but suspect thoracic spine and ribs may also be contributing to cervical symptoms based on patient history and current presentation. 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    Personal Factors and Comorbidities  Past/Current Experience;Comorbidity 1;Other   chronicity of condition   Comorbidities  HTN    Examination-Activity Limitations  Lift;Other   working, Associate Professor  Stable/Uncomplicated    Clinical Decision Making  Low    Rehab Potential  Good    PT Frequency  2x / week    PT Duration  6 weeks    PT Treatment/Interventions  ADLs/Self Care Home Management;Aquatic Therapy;Cryotherapy;Electrical Stimulation;Iontophoresis 4mg /ml Dexamethasone;Traction;Gait training;Stair training;Functional mobility training;Therapeutic activities;Therapeutic  exercise;Balance training;Neuromuscular re-education;Patient/family education;Manual techniques;Passive range of motion;Dry needling;Taping;Joint Manipulations    PT Next Visit Plan  f/u with HEP, assess T-spine/rib mobility, suboccipital release and c-spine mobs    PT Home Exercise Plan  eval: self mobilizaiton to suboccipitals with tennis balls, chin tucks "bobble head"    Consulted and Agree with Plan of Care  Patient       Patient will benefit from skilled therapeutic intervention in order to improve the following deficits and impairments:  Decreased activity tolerance, Pain, Decreased range of motion, Hypomobility  Visit Diagnosis: Chronic intractable headache, unspecified headache type  Cervicalgia     Problem List Patient Active Problem List   Diagnosis Date Noted  . Constipation 09/05/2018  . Abdominal bloating 09/05/2018  . Right upper quadrant pain 06/06/2018  . Chronic migraine w/o aura w/o status migrainosus, not intractable 08/22/2015  . Abdominal pain, epigastric 07/03/2013  . Acute kidney injury ([place]) 10/20/2011  . Nausea & vomiting 10/19/2011  . Suprapubic abdominal pain 10/19/2011  . Dehydration 10/19/2011  . Hypothyroidism 10/19/2011  . Migraines 10/19/2011    6:12 PM, 09/17/18 Jerene Pitch, DPT Physical Therapy with [facility]  [phone] office  [place] [address] [place], [place], [address] Phone: [phone]   Fax:  [phone]  Name: Martha Aguilar MRN: [id number] Date of Birth: [birth year]

## 2018-09-18 ENCOUNTER — Encounter (HOSPITAL_COMMUNITY): Payer: BC Managed Care – PPO

## 2018-09-18 NOTE — Telephone Encounter (Signed)
Sent card to patient

## 2018-09-20 ENCOUNTER — Ambulatory Visit (HOSPITAL_COMMUNITY): Payer: BC Managed Care – PPO | Admitting: Physical Therapy

## 2018-09-20 ENCOUNTER — Telehealth (HOSPITAL_COMMUNITY): Payer: Self-pay | Admitting: Pulmonary Disease

## 2018-09-20 NOTE — Telephone Encounter (Signed)
09/20/18  pt called and wanted to cx today - no reason was given

## 2018-09-24 ENCOUNTER — Ambulatory Visit (HOSPITAL_COMMUNITY): Payer: BC Managed Care – PPO | Admitting: Physical Therapy

## 2018-09-24 ENCOUNTER — Telehealth (HOSPITAL_COMMUNITY): Payer: Self-pay | Admitting: Pulmonary Disease

## 2018-09-24 NOTE — Telephone Encounter (Signed)
09/24/18  pt left a message to cx said that her head was still "killing her" in the front

## 2018-09-27 ENCOUNTER — Ambulatory Visit (HOSPITAL_COMMUNITY): Payer: BC Managed Care – PPO | Admitting: Physical Therapy

## 2018-10-01 ENCOUNTER — Ambulatory Visit (HOSPITAL_COMMUNITY): Payer: BC Managed Care – PPO | Admitting: Physical Therapy

## 2018-10-01 ENCOUNTER — Telehealth (HOSPITAL_COMMUNITY): Payer: Self-pay | Admitting: Physical Therapy

## 2018-10-01 NOTE — Telephone Encounter (Signed)
Patient did not show up for today's appointment. Called patient to remind patient about no show/cancellation policy. This is patient's second no show in a row. Will formally discharge patient if she does not show up for next scheduled visit. Reminded patient of next scheduled visit.   2:27 PM, 10/01/18 Martha Aguilar, DPT Physical Therapy with [facility]  [phone] office

## 2018-10-03 ENCOUNTER — Ambulatory Visit (HOSPITAL_COMMUNITY): Payer: BC Managed Care – PPO | Admitting: Physical Therapy

## 2018-10-03 ENCOUNTER — Telehealth (HOSPITAL_COMMUNITY): Payer: Self-pay | Admitting: Physical Therapy

## 2018-10-03 NOTE — Telephone Encounter (Signed)
pt called to cancel due to her migrane headache.

## 2018-10-07 ENCOUNTER — Ambulatory Visit (HOSPITAL_COMMUNITY): Payer: BC Managed Care – PPO | Admitting: Physical Therapy

## 2018-10-07 ENCOUNTER — Telehealth (HOSPITAL_COMMUNITY): Payer: Self-pay | Admitting: Physical Therapy

## 2018-10-07 DIAGNOSIS — M5481 Occipital neuralgia: Secondary | ICD-10-CM

## 2018-10-07 NOTE — Telephone Encounter (Signed)
Patient did no show up to her appointment today. Called and left voicemail about cancellation/no show policy and told patient she will have to schedule appointments one at a time due to her high no show/cancellation rate. Left upcoming appointment scheduled and reminded patient of appointment, canceled all appointments following next appointment.   2:23 PM, 10/07/18 Jerene Pitch, DPT Physical Therapy with [facility]  [phone] office

## 2018-10-08 NOTE — Telephone Encounter (Signed)
We can refer her to pain management

## 2018-10-09 ENCOUNTER — Ambulatory Visit (HOSPITAL_COMMUNITY): Payer: BC Managed Care – PPO | Admitting: Physical Therapy

## 2018-10-09 ENCOUNTER — Encounter (HOSPITAL_COMMUNITY): Payer: Self-pay | Admitting: Physical Therapy

## 2018-10-09 NOTE — Therapy (Signed)
[place], [place], [address] Phone: [phone]   Fax:  [phone]  Patient Details  Name: Martha Aguilar MRN: [id number] Date of Birth: [birth year] Referring Provider:  No ref. provider found  Encounter Date: 10/09/2018  PHYSICAL THERAPY DISCHARGE SUMMARY  Visits from Start of Care: 1  Current functional level related to goals / functional outcomes: Unable to determine at this time secondary to noncompliance with plan of care   Remaining deficits: Unable to determine at this time secondary to noncompliance with plan of care   Education / Equipment: None provided on this date. Patient has not returned to physical therapy since her initial evaluation.  Plan:                                                     Patient goals were not met. Patient is being discharged due to not returning since the last visit.  ?????      3:09 PM, 10/09/18 Jerene Pitch, DPT Physical Therapy with [facility]  [phone] office  [place] [address] [place], [place], [address] Phone: [phone]   Fax:  [phone]

## 2018-10-16 ENCOUNTER — Ambulatory Visit (HOSPITAL_COMMUNITY): Payer: BC Managed Care – PPO | Admitting: Physical Therapy

## 2018-10-17 ENCOUNTER — Encounter: Payer: Self-pay | Admitting: *Deleted

## 2018-10-17 NOTE — Progress Notes (Signed)
She does not take Ubrelvy anymore I verified this with Dr. Tomi Likens and [employer]. I deleted the PA that was sent to [name] from [employer]. I called the pharmacy and they made a note of it as well.

## 2018-10-18 ENCOUNTER — Encounter (HOSPITAL_COMMUNITY): Payer: BC Managed Care – PPO | Admitting: Physical Therapy

## 2018-10-21 ENCOUNTER — Encounter (HOSPITAL_COMMUNITY): Payer: BC Managed Care – PPO | Admitting: Physical Therapy

## 2018-10-21 ENCOUNTER — Other Ambulatory Visit: Payer: Self-pay

## 2018-10-21 ENCOUNTER — Other Ambulatory Visit (HOSPITAL_COMMUNITY)
Admission: RE | Admit: 2018-10-21 | Discharge: 2018-10-21 | Disposition: A | Discharge status: Home / Self Care | Payer: BC Managed Care – PPO | Source: Ambulatory Visit | Attending: Internal Medicine | Admitting: Internal Medicine

## 2018-10-22 ENCOUNTER — Other Ambulatory Visit (HOSPITAL_COMMUNITY)
Admission: RE | Admit: 2018-10-22 | Discharge: 2018-10-22 | Disposition: A | Discharge status: Home / Self Care | Payer: BC Managed Care – PPO | Source: Ambulatory Visit | Attending: Internal Medicine | Admitting: Internal Medicine

## 2018-10-22 ENCOUNTER — Other Ambulatory Visit: Payer: Self-pay

## 2018-10-22 DIAGNOSIS — Z01812 Encounter for preprocedural laboratory examination: Secondary | ICD-10-CM | POA: Insufficient documentation

## 2018-10-22 DIAGNOSIS — Z20828 Contact with and (suspected) exposure to other viral communicable diseases: Secondary | ICD-10-CM | POA: Insufficient documentation

## 2018-10-22 LAB — SARS CORONAVIRUS 2 (TAT 6-24 HRS): SARS Coronavirus 2: NEGATIVE

## 2018-10-23 ENCOUNTER — Encounter (HOSPITAL_COMMUNITY)
Admission: RE | Disposition: A | Discharge status: Home / Self Care | Payer: Self-pay | Source: Home / Self Care | Attending: Internal Medicine

## 2018-10-23 ENCOUNTER — Ambulatory Visit (HOSPITAL_COMMUNITY)
Admission: RE | Admit: 2018-10-23 | Discharge: 2018-10-23 | Disposition: A | Discharge status: Home / Self Care | Payer: BC Managed Care – PPO | Attending: Internal Medicine | Admitting: Internal Medicine

## 2018-10-23 ENCOUNTER — Other Ambulatory Visit: Payer: Self-pay

## 2018-10-23 ENCOUNTER — Encounter (HOSPITAL_COMMUNITY): Payer: BC Managed Care – PPO | Admitting: Physical Therapy

## 2018-10-23 ENCOUNTER — Encounter (HOSPITAL_COMMUNITY): Payer: Self-pay | Admitting: *Deleted

## 2018-10-23 DIAGNOSIS — R140 Abdominal distension (gaseous): Secondary | ICD-10-CM | POA: Insufficient documentation

## 2018-10-23 DIAGNOSIS — K5730 Diverticulosis of large intestine without perforation or abscess without bleeding: Secondary | ICD-10-CM

## 2018-10-23 DIAGNOSIS — K626 Ulcer of anus and rectum: Secondary | ICD-10-CM | POA: Insufficient documentation

## 2018-10-23 DIAGNOSIS — K6389 Other specified diseases of intestine: Secondary | ICD-10-CM | POA: Insufficient documentation

## 2018-10-23 DIAGNOSIS — K644 Residual hemorrhoidal skin tags: Secondary | ICD-10-CM | POA: Diagnosis not present

## 2018-10-23 DIAGNOSIS — Z79890 Hormone replacement therapy (postmenopausal): Secondary | ICD-10-CM | POA: Diagnosis not present

## 2018-10-23 DIAGNOSIS — G43909 Migraine, unspecified, not intractable, without status migrainosus: Secondary | ICD-10-CM | POA: Diagnosis not present

## 2018-10-23 DIAGNOSIS — Z86010 Personal history of colonic polyps: Secondary | ICD-10-CM | POA: Diagnosis not present

## 2018-10-23 DIAGNOSIS — Z79899 Other long term (current) drug therapy: Secondary | ICD-10-CM | POA: Insufficient documentation

## 2018-10-23 DIAGNOSIS — Z1211 Encounter for screening for malignant neoplasm of colon: Secondary | ICD-10-CM | POA: Diagnosis not present

## 2018-10-23 DIAGNOSIS — M5481 Occipital neuralgia: Secondary | ICD-10-CM | POA: Insufficient documentation

## 2018-10-23 DIAGNOSIS — K5909 Other constipation: Secondary | ICD-10-CM | POA: Insufficient documentation

## 2018-10-23 DIAGNOSIS — E039 Hypothyroidism, unspecified: Secondary | ICD-10-CM | POA: Insufficient documentation

## 2018-10-23 DIAGNOSIS — K6289 Other specified diseases of anus and rectum: Secondary | ICD-10-CM | POA: Insufficient documentation

## 2018-10-23 DIAGNOSIS — D120 Benign neoplasm of cecum: Secondary | ICD-10-CM | POA: Insufficient documentation

## 2018-10-23 DIAGNOSIS — K5900 Constipation, unspecified: Secondary | ICD-10-CM | POA: Insufficient documentation

## 2018-10-23 DIAGNOSIS — Z800 Family history of malignant neoplasm of digestive organs: Secondary | ICD-10-CM | POA: Insufficient documentation

## 2018-10-23 HISTORY — PX: POLYPECTOMY: SHX5525

## 2018-10-23 HISTORY — PX: BIOPSY: SHX5522

## 2018-10-23 HISTORY — PX: COLONOSCOPY: SHX5424

## 2018-10-23 LAB — HM COLONOSCOPY

## 2018-10-23 SURGERY — COLONOSCOPY
Anesthesia: Moderate Sedation

## 2018-10-23 MED ORDER — MIDAZOLAM HCL 5 MG/5ML IJ SOLN
INTRAMUSCULAR | Status: AC
  Filled 2018-10-23: qty 10

## 2018-10-23 MED ORDER — MEPERIDINE HCL 50 MG/ML IJ SOLN
INTRAMUSCULAR | Status: DC | PRN
  Administered 2018-10-23 (×4): 25 mg

## 2018-10-23 MED ORDER — MIDAZOLAM HCL 5 MG/5ML IJ SOLN
INTRAMUSCULAR | Status: DC | PRN
  Administered 2018-10-23 (×2): 2 mg via INTRAVENOUS
  Administered 2018-10-23: 1 mg via INTRAVENOUS
  Administered 2018-10-23: 3 mg via INTRAVENOUS
  Administered 2018-10-23 (×2): 2 mg via INTRAVENOUS

## 2018-10-23 MED ORDER — SODIUM CHLORIDE 0.9 % IV SOLN
INTRAVENOUS | Status: DC
  Administered 2018-10-23: 07:00:00 via INTRAVENOUS

## 2018-10-23 MED ORDER — MIDAZOLAM HCL 5 MG/5ML IJ SOLN
INTRAMUSCULAR | Status: AC
  Filled 2018-10-23: qty 5

## 2018-10-23 MED ORDER — METAMUCIL SMOOTH TEXTURE 58.6 % PO POWD: 1.0000 | Freq: Every day | ORAL | Status: DC

## 2018-10-23 MED ORDER — STERILE WATER FOR IRRIGATION IR SOLN
Status: DC | PRN
  Administered 2018-10-23: 08:00:00

## 2018-10-23 MED ORDER — POLYETHYLENE GLYCOL 3350 17 GM/SCOOP PO POWD: 17.0000 g | Freq: Every day | ORAL | 0 refills | Status: DC

## 2018-10-23 MED ORDER — MEPERIDINE HCL 50 MG/ML IJ SOLN
INTRAMUSCULAR | Status: AC
  Filled 2018-10-23: qty 1

## 2018-10-23 NOTE — H&P (Signed)
Martha Aguilar is an 48 y.o. female.   Chief Complaint: Patient is here for colonoscopy. HPI: Patient is 48 year old Caucasian female who is here for colonoscopy primarily for screening purposes.  She has chronic constipation of at least 3 years duration.  She can go 1 week without a bowel movement.  She has been using Linzess which helps but is not covered by her insurance.  She has been using MiraLAX on as-needed basis.  She denies anorexia or weight loss.  She has a history of hemorrhoids and sometimes sees blood on the tissue. Family history significant for CRC in maternal aunt who was diagnosed at age 34 and died within few months of metastatic disease.  Past Medical History:  Diagnosis Date  . Headache(784.0)    migraines  . Hypothyroidism   . Migraines   . Occipital neuralgia         Chronic constipation.  Past Surgical History:  Procedure Laterality Date  . ESOPHAGOGASTRODUODENOSCOPY (EGD) WITH PROPOFOL N/A 06/13/2018   Procedure: ESOPHAGOGASTRODUODENOSCOPY (EGD) WITH PROPOFOL;  Surgeon: Rogene Houston, MD;  Location: [facility];  Service: Endoscopy;  Laterality: N/A;  . TUBAL LIGATION    . uterine ablation    . uterine polyps      Family History  Problem Relation Age of Onset  . Migraines Mother        benign small bowel tumor  . Prostate cancer Father   . Seizures Brother        MVA  . Migraines Other    Social History:  reports that she has never smoked. She has never used smokeless tobacco. She reports current alcohol use. She reports that she does not use drugs.  Allergies:  Allergies  Allergen Reactions  . Aimovig [Erenumab] Other (See Comments)    Burning in the back of the head, severe migraine  . Other Rash    Oxtellar  Oxcarbazepine   . Compazine [Prochlorperazine] Other (See Comments)    IV form, can take PO    Medications Prior to Admission  Medication Sig Dispense Refill  . ALPRAZolam (XANAX) 1 MG tablet Take 1 mg by mouth at bedtime.  5  .  amitriptyline (ELAVIL) 50 MG tablet Take 3 tablets (150 mg total) by mouth at bedtime. 90 tablet 11  . baclofen (LIORESAL) 10 MG tablet Take 10 mg by mouth as needed (Pain).     . butalbital-acetaminophen-caffeine (FIORICET) 50-325-40 MG tablet Take 1 tablet by mouth 3 times/day as needed-between meals & bedtime for migraine. Can have up to 10 a month    . DULoxetine (CYMBALTA) 30 MG capsule Take 1 capsule daily for one week, then increase to 2 capsules daily. (Patient taking differently: Take 60 mg by mouth at bedtime. ) 60 capsule 0  . gabapentin (NEURONTIN) 300 MG capsule Take 2 capsules (600 mg total) by mouth 3 (three) times daily. (Patient taking differently: Take 200 mg by mouth 3 (three) times daily. ) 180 capsule 3  . Galcanezumab-gnlm (EMGALITY) 120 MG/ML SOAJ Inject 120 mg into the skin every 30 (thirty) days. 1 pen 11  . levothyroxine (SYNTHROID, LEVOTHROID) 150 MCG tablet Take 150 mcg by mouth daily before breakfast.    . Multiple Vitamin (MULTIVITAMIN WITH MINERALS) TABS tablet Take 1 tablet by mouth at bedtime.    Marland Kitchen PREMARIN 0.3 MG tablet daily.    . progesterone (PROMETRIUM) 100 MG capsule daily.    . Rimegepant Sulfate (NURTEC) 75 MG TBDP Take 1 tablet by mouth daily  as needed. (Patient taking differently: Take 1 tablet by mouth daily as needed (Migraine). ) 8 tablet 3  . SUMAtriptan (IMITREX) 100 MG tablet Take 100 mg by mouth daily as needed for migraine.     . SUMAtriptan 6 MG/0.5ML SOAJ Inject 6 mg into the skin as needed (Migraine).     . valACYclovir (VALTREX) 1000 MG tablet Take 1 tablet (1,000 mg total) by mouth 2 (two) times daily. 20 tablet 0  . vitamin C (ASCORBIC ACID) 500 MG tablet Take 500 mg by mouth at bedtime.    . lidocaine (LIDODERM) 5 % Place 1 patch onto the skin daily. Remove & Discard patch within 12 hours or as directed by MD (Patient not taking: Reported on 10/22/2018) 28 patch 1  . linaclotide (LINZESS) 145 MCG CAPS capsule Take 1 capsule (145 mcg total) by  mouth daily before breakfast. (Patient not taking: Reported on 10/22/2018) 30 capsule 0  . ondansetron (ZOFRAN ODT) 4 MG disintegrating tablet Take 1-2 tablets (4 mg total) by mouth every 8 (eight) hours as needed (Patient taking differently: Take 4-8 mg by mouth every 8 (eight) hours as needed for nausea or vomiting. Take 1-2 tablets (4 mg total) by mouth every 8 (eight) hours as needed) 30 tablet 12  . pantoprazole (PROTONIX) 40 MG tablet Take 1 tablet (40 mg total) by mouth 2 (two) times daily before a meal. (Patient not taking: Reported on 10/22/2018) 60 tablet 3  . tiZANidine (ZANAFLEX) 4 MG tablet Take 4 mg by mouth every 6 (six) hours as needed (pain).     . Ubrogepant (UBRELVY) 100 MG TABS Take 1 tablet by mouth as needed (May repeat dose after 2 hours if needed.  Maximum 2 tablets in 24 hours). 16 tablet 3    Results for orders placed or performed during the hospital encounter of 10/22/18 (from the past 48 hour(s))  SARS CORONAVIRUS 2 (TAT 6-24 HRS) Nasopharyngeal Nasopharyngeal Swab     Status: None   Collection Time: 10/22/18  7:04 AM   Specimen: Nasopharyngeal Swab  Result Value Ref Range   SARS Coronavirus 2 NEGATIVE NEGATIVE    Comment: (NOTE) SARS-CoV-2 target nucleic acids are NOT DETECTED. The SARS-CoV-2 RNA is generally detectable in upper and lower respiratory specimens during the acute phase of infection. Negative results do not preclude SARS-CoV-2 infection, do not rule out co-infections with other pathogens, and should not be used as the sole basis for treatment or other patient management decisions. Negative results must be combined with clinical observations, patient history, and epidemiological information. The expected result is Negative. Fact Sheet for Patients: SugarRoll.be Fact Sheet for Healthcare Providers: https://www.woods-mathews.com/ This test is not yet approved or cleared by the [place] FDA and  has been  authorized for detection and/or diagnosis of SARS-CoV-2 by FDA under an Emergency Use Authorization (EUA). This EUA will remain  in effect (meaning this test can be used) for the duration of the COVID-19 declaration under Section 56 4(b)(1) of the Act, 21 U.S.C. section 360bbb-3(b)(1), unless the authorization is terminated or revoked sooner. Performed at [facility], [place] [address] [place], [place] [address]    No results found.  ROS  Blood pressure (!) 115/96, pulse (!) 105, temperature 98.6 F (37 C), temperature source Oral, resp. rate 16, SpO2 100 %. Physical Exam  Constitutional: She appears well-developed and well-nourished.  HENT:  Mouth/Throat: Oropharynx is clear and moist.  Eyes: Conjunctivae are normal. No scleral icterus.  Neck: No thyromegaly present.  Cardiovascular: Normal rate, regular rhythm and normal heart sounds.  No murmur heard. Respiratory: Effort normal and breath sounds normal.  GI: Soft. She exhibits no distension and no mass. There is no abdominal tenderness.  Musculoskeletal:        General: No edema.  Lymphadenopathy:    She has no cervical adenopathy.  Neurological: She is alert.  Skin: Skin is warm and dry.     Assessment/Plan Average risk screening colonoscopy. Chronic constipation.   Hildred Laser, MD 10/23/2018, 7:31 AM

## 2018-10-23 NOTE — Op Note (Addendum)
[facility] Patient Name: Martha Aguilar Procedure Date: 10/23/2018 7:08 AM MRN: [id number] Date of Birth: [birth year] Attending MD: Hildred Laser , MD CSN: [id number] Age: 48 Admit Type: Outpatient Procedure:                Colonoscopy Indications:              Screening for colorectal malignant neoplasm Providers:                Hildred Laser, MD, Janeece Riggers, RN, Aram Candela Referring MD:             Ewing Luan Pulling, MD Medicines:                Meperidine 100 mg IV, Midazolam 12 mg IV Complications:            No immediate complications. Estimated Blood Loss:     Estimated blood loss was minimal. Procedure:                Pre-Anesthesia Assessment:                           - Prior to the procedure, a History and Physical                            was performed, and patient medications and                            allergies were reviewed. The patient's tolerance of                            previous anesthesia was also reviewed. The risks                            and benefits of the procedure and the sedation                            options and risks were discussed with the patient.                            All questions were answered, and informed consent                            was obtained. Prior Anticoagulants: The patient has                            taken no previous anticoagulant or antiplatelet                            agents. ASA Grade Assessment: II - A patient with                            mild systemic disease. After reviewing the risks                            and benefits, the patient was deemed in  satisfactory condition to undergo the procedure.                           After obtaining informed consent, the colonoscope                            was passed under direct vision. Throughout the                            procedure, the patient's blood pressure, pulse, and                            oxygen  saturations were monitored continuously. The                            [id number] was introduced through the                            anus and advanced to the the cecum, identified by                            appendiceal orifice and ileocecal valve. The                            colonoscopy was technically difficult and complex                            due to significant looping at sigmoid colon                            Successful completion of the procedure was aided by                            changing the patient to a supine position, using                            manual pressure and withdrawing and reinserting the                            scope. Scope guide was also used. The quality of                            the bowel preparation was good. The ileocecal                            valve, appendiceal orifice, and rectum were                            photographed. Scope In: 7:43:48 AM Scope Out: 8:37:49 AM Scope Withdrawal Time: 0 hours 21 minutes 5 seconds  Total Procedure Duration: 0 hours 54 minutes 1 second  Findings:      The perianal and digital rectal examinations were normal.      A diffuse area of mild melanosis was found in  the entire colon.      A 5 to 8 mm polyp was found in the cecum. The polyp was sessile. The       polyp was removed with a cold snare. Resection and retrieval were       complete. To stop active bleeding, three hemostatic clips were       successfully placed (MR conditional). There was no bleeding at the end       of the procedure. The pathology specimen was placed into Bottle Number 1.      Two sessile polyps were found in the cecum. The polyps were small in       size. These were biopsied with a cold forceps for histology. The       pathology specimen was placed into Bottle Number 1.      A 5 to 8 mm polyp was found in the cecum. The polyp was sessile. The       polyp was removed with a cold snare. Resection and retrieval  were       complete. The pathology specimen was placed into Bottle Number 2.      Two small-mouthed diverticula were found in the sigmoid colon.      External hemorrhoids were found during retroflexion. The hemorrhoids       were small.      Anal papilla(e) were hypertrophied.      A single (solitary) five mm ulcer was found in the rectum. No bleeding       was present. Biopsies were taken with a cold forceps for histology. The       pathology specimen was placed into Bottle Number 3. Impression:               - Melanosis in the colon.                           - One 5 to 8 mm polyp in the cecum, removed with a                            cold snare. Resected and retrieved. Clips (MR                            conditional) were placed.                           - Two small polyps in the cecum. Biopsied.                           - One 5 to 8 mm polyp in the cecum, removed with a                            cold snare. Resected and retrieved.                           - Diverticulosis in the sigmoid colon.                           - Small rectal ulcer. Biopsied.                           -  External hemorrhoids.                           - Anal papilla(e) were hypertrophied. Moderate Sedation:      Moderate (conscious) sedation was administered by the endoscopy nurse       and supervised by the endoscopist. The following parameters were       monitored: oxygen saturation, heart rate, blood pressure, CO2       capnography and response to care. Total physician intraservice time was       62 minutes. Recommendation:           - Patient has a contact number available for                            emergencies. The signs and symptoms of potential                            delayed complications were discussed with the                            patient. Return to normal activities tomorrow.                            Written discharge instructions were provided to the                             patient.                           - High fiber diet today.                           - Continue present medications.                           - No aspirin, ibuprofen, naproxen, or other                            non-steroidal anti-inflammatory drugs for 7 days.                           - Await pathology results.                           - Repeat colonoscopy for surveillance based on                            pathology results.                           - No MRI until clipped have passed. Procedure Code(s):        --- Professional ---                           [id number], Colonoscopy, flexible; with removal of  tumor(s), polyp(s), or other lesion(s) by snare                            technique                           45380, 59, Colonoscopy, flexible; with biopsy,                            single or multiple                           99153, Moderate sedation; each additional 15                            minutes intraservice time                           99153, Moderate sedation; each additional 15                            minutes intraservice time                           99153, Moderate sedation; each additional 15                            minutes intraservice time                           G0500, Moderate sedation services provided by the                            same physician or other qualified health care                            professional performing a gastrointestinal                            endoscopic service that sedation supports,                            requiring the presence of an independent trained                            observer to assist in the monitoring of the                            patient's level of consciousness and physiological                            status; initial 15 minutes of intra-service time;                            patient age 85 years or older (additional time 21  be  reported with [phone], as appropriate) Diagnosis Code(s):        --- Professional ---                           K63.5, Polyp of colon                           Z12.11, Encounter for screening for malignant                            neoplasm of colon                           K63.89, Other specified diseases of intestine                           K64.4, Residual hemorrhoidal skin tags                           K62.89, Other specified diseases of anus and rectum                           K57.30, Diverticulosis of large intestine without                            perforation or abscess without bleeding CPT copyright 2019 [employer]. All rights reserved. The codes documented in this report are preliminary and upon coder review may  be revised to meet current compliance requirements. Hildred Laser, MD Hildred Laser, MD 10/23/2018 8:53:22 AM This report has been signed electronically. Number of Addenda: 0

## 2018-10-23 NOTE — Discharge Instructions (Signed)
No aspirin or NSAIDs for 1 week. Resume usual medications as before. Take polyethylene glycol/MiraLAX 17 g by mouth daily. Metamucil 4 to 6 g by mouth daily at bedtime. Dulcolax suppository every third day if necessary. High-fiber diet. No driving for 24 hours. Physician will call with biopsy results. Remember you cannot have an MRI until clips have passed.   Colonoscopy, Adult, Care After This sheet gives you information about how to care for yourself after your procedure. Your doctor may also give you more specific instructions. If you have problems or questions, call your doctor. What can I expect after the procedure? After the procedure, it is common to have:  A small amount of blood in your poop for 24 hours.  Some gas.  Mild cramping or bloating in your belly. Follow these instructions at home: General instructions  For the first 24 hours after the procedure: ? Do not drive or use machinery. ? Do not sign important documents. ? Do not drink alcohol. ? Do your daily activities more slowly than normal. ? Eat foods that are soft and easy to digest.  Take over-the-counter or prescription medicines only as told by your doctor. To help cramping and bloating:   Try walking around.  Put heat on your belly (abdomen) as told by your doctor. Use a heat source that your doctor recommends, such as a moist heat pack or a heating pad. ? Put a towel between your skin and the heat source. ? Leave the heat on for 20-30 minutes. ? Remove the heat if your skin turns bright red. This is especially important if you cannot feel pain, heat, or cold. You can get burned. Eating and drinking   Drink enough fluid to keep your pee (urine) clear or pale yellow.  Return to your normal diet as told by your doctor. Avoid heavy or fried foods that are hard to digest.  Avoid drinking alcohol for as long as told by your doctor. Contact a doctor if:  You have blood in your poop (stool) 2-3 days  after the procedure. Get help right away if:  You have more than a small amount of blood in your poop.  You see large clumps of tissue (blood clots) in your poop.  Your belly is swollen.  You feel sick to your stomach (nauseous).  You throw up (vomit).  You have a fever.  You have belly pain that gets worse, and medicine does not help your pain. Summary  After the procedure, it is common to have a small amount of blood in your poop. You may also have mild cramping and bloating in your belly.  For the first 24 hours after the procedure, do not drive or use machinery, do not sign important documents, and do not drink alcohol.  Get help right away if you have a lot of blood in your poop, feel sick to your stomach, have a fever, or have more belly pain. This information is not intended to replace advice given to you by your health care provider. Make sure you discuss any questions you have with your health care provider. Document Released: 01/28/2010 Document Revised: 10/26/2016 Document Reviewed: 09/20/2015 Elsevier Patient Education  [place].  High-Fiber Diet Fiber, also called dietary fiber, is a type of carbohydrate that is found in fruits, vegetables, whole grains, and beans. A high-fiber diet can have many health benefits. Your health care provider may recommend a high-fiber diet to help:  Prevent constipation. Fiber can make your bowel movements  more regular.  Lower your cholesterol.  Relieve the following conditions: ? Swelling of veins in the anus (hemorrhoids). ? Swelling and irritation (inflammation) of specific areas of the digestive tract (uncomplicated diverticulosis). ? A problem of the large intestine (colon) that sometimes causes pain and diarrhea (irritable bowel syndrome, IBS).  Prevent overeating as part of a weight-loss plan.  Prevent heart disease, type 2 diabetes, and certain cancers. What is my plan? The recommended daily fiber intake in grams  (g) includes:  38 g for men age 65 or younger.  30 g for men over age 81.  55 g for women age 65 or younger.  21 g for women over age 24. You can get the recommended daily intake of dietary fiber by:  Eating a variety of fruits, vegetables, grains, and beans.  Taking a fiber supplement, if it is not possible to get enough fiber through your diet. What do I need to know about a high-fiber diet?  It is better to get fiber through food sources rather than from fiber supplements. There is not a lot of research about how effective supplements are.  Always check the fiber content on the nutrition facts label of any prepackaged food. Look for foods that contain 5 g of fiber or more per serving.  Talk with a diet and nutrition specialist (dietitian) if you have questions about specific foods that are recommended or not recommended for your medical condition, especially if those foods are not listed below.  Gradually increase how much fiber you consume. If you increase your intake of dietary fiber too quickly, you may have bloating, cramping, or gas.  Drink plenty of water. Water helps you to digest fiber. What are tips for following this plan?  Eat a wide variety of high-fiber foods.  Make sure that half of the grains that you eat each day are whole grains.  Eat breads and cereals that are made with whole-grain flour instead of refined flour or white flour.  Eat brown rice, bulgur wheat, or millet instead of white rice.  Start the day with a breakfast that is high in fiber, such as a cereal that contains 5 g of fiber or more per serving.  Use beans in place of meat in soups, salads, and pasta dishes.  Eat high-fiber snacks, such as berries, raw vegetables, nuts, and popcorn.  Choose whole fruits and vegetables instead of processed forms like juice or sauce. What foods can I eat?  Fruits Berries. Pears. Apples. Oranges. Avocado. Prunes and raisins. Dried figs. Vegetables Sweet  potatoes. Spinach. Kale. Artichokes. Cabbage. Broccoli. Cauliflower. Green peas. Carrots. Squash. Grains Whole-grain breads. Multigrain cereal. Oats and oatmeal. Brown rice. Barley. Bulgur wheat. [place]. Quinoa. Bran muffins. Popcorn. Rye wafer crackers. Meats and other proteins Navy, kidney, and pinto beans. Soybeans. Split peas. Lentils. Nuts and seeds. Dairy Fiber-fortified yogurt. Beverages Fiber-fortified soy milk. Fiber-fortified orange juice. Other foods Fiber bars. The items listed above may not be a complete list of recommended foods and beverages. Contact a dietitian for more options. What foods are not recommended? Fruits Fruit juice. Cooked, strained fruit. Vegetables Fried potatoes. Canned vegetables. Well-cooked vegetables. Grains White bread. Pasta made with refined flour. White rice. Meats and other proteins Fatty cuts of meat. Fried chicken or fried fish. Dairy Milk. Yogurt. Cream cheese. Sour cream. Fats and oils Butters. Beverages Soft drinks. Other foods Cakes and pastries. The items listed above may not be a complete list of foods and beverages to avoid. Contact a dietitian for  more information. Summary  Fiber is a type of carbohydrate. It is found in fruits, vegetables, whole grains, and beans.  There are many health benefits of eating a high-fiber diet, such as preventing constipation, lowering blood cholesterol, helping with weight loss, and reducing your risk of heart disease, diabetes, and certain cancers.  Gradually increase your intake of fiber. Increasing too fast can result in cramping, bloating, and gas. Drink plenty of water while you increase your fiber.  The best sources of fiber include whole fruits and vegetables, whole grains, nuts, seeds, and beans. This information is not intended to replace advice given to you by your health care provider. Make sure you discuss any questions you have with your health care provider. Document Released:  12/26/2004 Document Revised: 10/30/2016 Document Reviewed: 10/30/2016 Elsevier Patient Education  2020 [employer].  Hemorrhoids Hemorrhoids are swollen veins that may develop:  In the butt (rectum). These are called internal hemorrhoids.  Around the opening of the butt (anus). These are called external hemorrhoids. Hemorrhoids can cause pain, itching, or bleeding. Most of the time, they do not cause serious problems. They usually get better with diet changes, lifestyle changes, and other home treatments. What are the causes? This condition may be caused by:  Having trouble pooping (constipation).  Pushing hard (straining) to poop.  Watery poop (diarrhea).  Pregnancy.  Being very overweight (obese).  Sitting for long periods of time.  Heavy lifting or other activity that causes you to strain.  Anal sex.  Riding a bike for a long period of time. What are the signs or symptoms? Symptoms of this condition include:  Pain.  Itching or soreness in the butt.  Bleeding from the butt.  Leaking poop.  Swelling in the area.  One or more lumps around the opening of your butt. How is this diagnosed? A doctor can often diagnose this condition by looking at the affected area. The doctor may also:  Do an exam that involves feeling the area with a gloved hand (digital rectal exam).  Examine the area inside your butt using a small tube (anoscope).  Order blood tests. This may be done if you have lost a lot of blood.  Have you get a test that involves looking inside the colon using a flexible tube with a camera on the end (sigmoidoscopy or colonoscopy). How is this treated? This condition can usually be treated at home. Your doctor may tell you to change what you eat, make lifestyle changes, or try home treatments. If these do not help, procedures can be done to remove the hemorrhoids or make them smaller. These may involve:  Placing rubber bands at the base of the hemorrhoids  to cut off their blood supply.  Injecting medicine into the hemorrhoids to shrink them.  Shining a type of light energy onto the hemorrhoids to cause them to fall off.  Doing surgery to remove the hemorrhoids or cut off their blood supply. Follow these instructions at home: Eating and drinking   Eat foods that have a lot of fiber in them. These include whole grains, beans, nuts, fruits, and vegetables.  Ask your doctor about taking products that have added fiber (fibersupplements).  Reduce the amount of fat in your diet. You can do this by: ? Eating low-fat dairy products. ? Eating less red meat. ? Avoiding processed foods.  Drink enough fluid to keep your pee (urine) pale yellow. Managing pain and swelling   Take a warm-water bath (sitz bath) for 20 minutes  to ease pain. Do this 3-4 times a day. You may do this in a bathtub or using a portable sitz bath that fits over the toilet.  If told, put ice on the painful area. It may be helpful to use ice between your warm baths. ? Put ice in a plastic bag. ? Place a towel between your skin and the bag. ? Leave the ice on for 20 minutes, 2-3 times a day. General instructions  Take over-the-counter and prescription medicines only as told by your doctor. ? Medicated creams and medicines may be used as told.  Exercise often. Ask your doctor how much and what kind of exercise is best for you.  Go to the bathroom when you have the urge to poop. Do not wait.  Avoid pushing too hard when you poop.  Keep your butt dry and clean. Use wet toilet paper or moist towelettes after pooping.  Do not sit on the toilet for a long time.  Keep all follow-up visits as told by your doctor. This is important. Contact a doctor if you:  Have pain and swelling that do not get better with treatment or medicine.  Have trouble pooping.  Cannot poop.  Have pain or swelling outside the area of the hemorrhoids. Get help right away if you  have:  Bleeding that will not stop. Summary  Hemorrhoids are swollen veins in the butt or around the opening of the butt.  They can cause pain, itching, or bleeding.  Eat foods that have a lot of fiber in them. These include whole grains, beans, nuts, fruits, and vegetables.  Take a warm-water bath (sitz bath) for 20 minutes to ease pain. Do this 3-4 times a day. This information is not intended to replace advice given to you by your health care provider. Make sure you discuss any questions you have with your health care provider. Document Released: 10/05/2007 Document Revised: 01/03/2018 Document Reviewed: 05/17/2017 Elsevier Patient Education  [place].  Colon Polyps  Polyps are tissue growths inside the body. Polyps can grow in many places, including the large intestine (colon). A polyp may be a round bump or a mushroom-shaped growth. You could have one polyp or several. Most colon polyps are noncancerous (benign). However, some colon polyps can become cancerous over time. Finding and removing the polyps early can help prevent this. What are the causes? The exact cause of colon polyps is not known. What increases the risk? You are more likely to develop this condition if you:  Have a family history of colon cancer or colon polyps.  Are older than 38 or older than 45 if you are African American.  Have inflammatory bowel disease, such as ulcerative colitis or Crohn's disease.  Have certain hereditary conditions, such as: ? Familial adenomatous polyposis. ? Lynch syndrome. ? Turcot syndrome. ? Peutz-Jeghers syndrome.  Are overweight.  Smoke cigarettes.  Do not get enough exercise.  Drink too much alcohol.  Eat a diet that is high in fat and red meat and low in fiber.  Had childhood cancer that was treated with abdominal radiation. What are the signs or symptoms? Most polyps do not cause symptoms. If you have symptoms, they may include:  Blood coming from  your rectum when having a bowel movement.  Blood in your stool. The stool may look dark red or black.  Abdominal pain.  A change in bowel habits, such as constipation or diarrhea. How is this diagnosed? This condition is diagnosed with a colonoscopy.  This is a procedure in which a lighted, flexible scope is inserted into the anus and then passed into the colon to examine the area. Polyps are sometimes found when a colonoscopy is done as part of routine cancer screening tests. How is this treated? Treatment for this condition involves removing any polyps that are found. Most polyps can be removed during a colonoscopy. Those polyps will then be tested for cancer. Additional treatment may be needed depending on the results of testing. Follow these instructions at home: Lifestyle  Maintain a healthy weight, or lose weight if recommended by your health care provider.  Exercise every day or as told by your health care provider.  Do not use any products that contain nicotine or tobacco, such as cigarettes and e-cigarettes. If you need help quitting, ask your health care provider.  If you drink alcohol, limit how much you have: ? 0-1 drink a day for women. ? 0-2 drinks a day for men.  Be aware of how much alcohol is in your drink. In the [place] one drink equals one 12 oz bottle of beer (355 mL), one 5 oz glass of wine (148 mL), or one 1 oz shot of hard liquor (44 mL). Eating and drinking   Eat foods that are high in fiber, such as fruits, vegetables, and whole grains.  Eat foods that are high in calcium and vitamin D, such as milk, cheese, yogurt, eggs, liver, fish, and broccoli.  Limit foods that are high in fat, such as fried foods and desserts.  Limit the amount of red meat and processed meat you eat, such as hot dogs, sausage, bacon, and lunch meats. General instructions  Keep all follow-up visits as told by your health care provider. This is important. ? This includes having  regularly scheduled colonoscopies. ? Talk to your health care provider about when you need a colonoscopy. Contact a health care provider if:  You have new or worsening bleeding during a bowel movement.  You have new or increased blood in your stool.  You have a change in bowel habits.  You lose weight for no known reason. Summary  Polyps are tissue growths inside the body. Polyps can grow in many places, including the colon.  Most colon polyps are noncancerous (benign), but some can become cancerous over time.  This condition is diagnosed with a colonoscopy.  Treatment for this condition involves removing any polyps that are found. Most polyps can be removed during a colonoscopy. This information is not intended to replace advice given to you by your health care provider. Make sure you discuss any questions you have with your health care provider. Document Released: 09/22/2003 Document Revised: 04/12/2017 Document Reviewed: 04/12/2017 Elsevier Patient Education  2020 [employer].

## 2018-10-24 LAB — SURGICAL PATHOLOGY

## 2018-10-30 ENCOUNTER — Other Ambulatory Visit: Payer: Self-pay | Admitting: Neurology

## 2018-10-30 ENCOUNTER — Encounter (HOSPITAL_COMMUNITY): Payer: Self-pay | Admitting: Internal Medicine

## 2018-10-30 NOTE — Telephone Encounter (Signed)
Requested Prescriptions   Pending Prescriptions Disp Refills  . gabapentin (NEURONTIN) 300 MG capsule [Pharmacy Med Name: GABAPENTIN 300 MG CAPSULE] 180 capsule 0    Sig: TAKE 2 CAPSULES BY MOUTH 3 TIMES DAILY.   Rx last filled: 06/07/18 #180 3 refills  Pt last seen: 09/06/18   Follow up appt scheduled: 01/13/2019

## 2018-10-31 ENCOUNTER — Ambulatory Visit: Payer: BC Managed Care – PPO | Admitting: Neurology

## 2018-11-12 ENCOUNTER — Other Ambulatory Visit: Payer: Self-pay | Admitting: Neurology

## 2018-11-13 NOTE — Telephone Encounter (Signed)
Requested Prescriptions   Pending Prescriptions Disp Refills  . DULoxetine (CYMBALTA) 30 MG capsule [Pharmacy Med Name: DULOXETINE 30MG  DR CAPSULE] 60 capsule 0    Sig: TAKE ONE CAPSULE BY MOUTH ONCE DAILY FOR 1 WEEK; THEN INCREASE TO 2 CAPSULES DAILY.   Rx last filled: 09/06/18 #60 0 refills  Pt last seen: 09/06/18  Follow up appt scheduled: 01/13/2019

## 2018-11-14 ENCOUNTER — Other Ambulatory Visit: Payer: Self-pay | Admitting: Neurology

## 2018-11-15 NOTE — Telephone Encounter (Signed)
Rx approved by provider.

## 2018-11-15 NOTE — Telephone Encounter (Signed)
Requested Prescriptions   Pending Prescriptions Disp Refills  . butalbital-acetaminophen-caffeine (FIORICET) 50-325-40 MG tablet [Pharmacy Med Name: BUTALB-ACETAMIN-CAFF 50-325-40] 10 tablet 0    Sig: TAKE (1) TABLET BY MOUTH EVERY SIX HOURS AS NEEDED. MAX 10 TABS PER MONTH.   Rx last filled: 08/24/18 #1  Pt last seen: 09/06/18   Follow up appt scheduled: 01/13/2019

## 2018-11-18 ENCOUNTER — Encounter: Payer: Self-pay | Admitting: *Deleted

## 2018-11-18 NOTE — Progress Notes (Addendum)
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 con'tn

## 2018-11-26 NOTE — Progress Notes (Signed)
Prior authorization for Martha Aguilar has been denied.  You may complete an appeal and request a re-evaluation of the coverage determination for this patient. For patients not covered by Medicare or Medicaid, information about how to complete an appeal for such a patient will be sent to you shortly. If you would like to start an appeal for a patient not covered by Medicare or Medicaid, please call CoverMyMeds at [phone].  2 of the answer on PA will mark incorrectly will try to resend form or call to fix corrections to get this approved

## 2018-11-27 NOTE — Progress Notes (Deleted)
Patient's Name: Martha Aguilar  MRN: [id number]  Referring Provider: Pieter Partridge, DO  DOB: [birth year]  PCP: Sinda Du, MD  DOS: 11/28/2018  Note by: Gillis Santa, MD  Service setting: Ambulatory outpatient  Specialty: Interventional Pain Management  Location: [facility] (AMB) Pain Management Facility  Visit type: Initial Patient Evaluation  Patient type: New Patient   Primary Reason(s) for Visit: Encounter for initial evaluation of one or more chronic problems (new to examiner) potentially causing chronic pain, and posing a threat to normal musculoskeletal function. (Level of risk: High) CC: No chief complaint on file.  HPI  Martha Aguilar is a 48 y.o. year old, female patient, who comes today to see [name] for the first time for an initial evaluation of her chronic pain. She has Nausea & vomiting; Suprapubic abdominal pain; Dehydration; Hypothyroidism; Migraines; Acute kidney injury ([place]); Abdominal pain, epigastric; Chronic migraine w/o aura w/o status migrainosus, not intractable; Right upper quadrant pain; Constipation; and Abdominal bloating on their problem list. Today she comes in for evaluation of her No chief complaint on file.  Pain Assessment: Location:     Radiating:   Onset:   Duration:   Quality:   Severity:  /10 (subjective, self-reported pain score)  Note: Reported level is compatible with observation.                         When using our objective Pain Scale, levels between 6 and 10/10 are said to belong in an emergency room, as it progressively worsens from a 6/10, described as severely limiting, requiring emergency care not usually available at an outpatient pain management facility. At a 6/10 level, communication becomes difficult and requires great effort. Assistance to reach the emergency department may be required. Facial flushing and profuse sweating along with potentially dangerous increases in heart rate and blood pressure will be evident. Effect on ADL:   Timing:    Modifying factors:   BP:    HR:    Onset and Duration: {Hx; Onset and Duration:210120511} Cause of pain: {Hx; Cause:210120521} Severity: {Pain Severity:210120502} Timing: {Symptoms; Timing:210120501} Aggravating Factors: {Causes; Aggravating pain factors:210120507} Alleviating Factors: {Causes; Alleviating Factors:210120500} Associated Problems: {Hx; Associated problems:210120515} Quality of Pain: {Hx; Symptom quality or Descriptor:210120531} Previous Examinations or Tests: {Hx; Previous examinations or test:210120529} Previous Treatments: {Hx; Previous Treatment:210120503}  The patient comes into the clinics today for the first time for a chronic pain management evaluation. ***  Today I took the time to provide the patient with information regarding my pain practice. The patient was informed that my practice is divided into two sections: an interventional pain management section, as well as a completely separate and distinct medication management section. I explained that I have procedure days for my interventional therapies, and evaluation days for follow-ups and medication management. Because of the amount of documentation required during both, they are kept separated. This means that there is the possibility that she may be scheduled for a procedure on one day, and medication management the next. I have also informed her that because of staffing and facility limitations, I no longer take patients for medication management only. To illustrate the reasons for this, I gave the patient the example of surgeons, and how inappropriate it would be to refer a patient to his/her care, just to write for the post-surgical antibiotics on a surgery done by a different surgeon.   Because interventional pain management is my board-certified specialty, the patient was informed that joining my practice  means that they are open to any and all interventional therapies. I made it clear that this does not mean  that they will be forced to have any procedures done. What this means is that I believe interventional therapies to be essential part of the diagnosis and proper management of chronic pain conditions. Therefore, patients not interested in these interventional alternatives will be better served under the care of a different practitioner.  The patient was also made aware of my Comprehensive Pain Management Safety Guidelines where by joining my practice, they limit all of their nerve blocks and joint injections to those done by our practice, for as long as we are retained to manage their care.   Historic Controlled Substance Pharmacotherapy Review  PMP and historical list of controlled substances: ***  Highest opioid analgesic regimen found: ***  Most recent opioid analgesic: ***  Current opioid analgesics:  ***  Highest recorded MME/day: *** mg/day MME/day: *** mg/day  Medications: The patient did not bring the medication(s) to the appointment, as requested in our "New Patient Package" Pharmacodynamics: Desired effects: Analgesia: The patient reports >50% benefit. Reported improvement in function: The patient reports medication allows her to accomplish basic ADLs. Clinically meaningful improvement in function (CMIF): Sustained CMIF goals met Perceived effectiveness: Described as relatively effective, allowing for increase in activities of daily living (ADL) Undesirable effects: Side-effects or Adverse reactions: None reported Historical Monitoring: The patient  reports no history of drug use. List of all UDS Test(s): No results found for: MDMA, COCAINSCRNUR, [place], [place], CANNABQUANT, [place], [place] List of other Serum/Urine Drug Screening Test(s):  No results found for: AMPHSCRSER, BARBSCRSER, BENZOSCRSER, COCAINSCRSER, COCAINSCRNUR, PCPSCRSER, PCPQUANT, THCSCRSER, THCU, CANNABQUANT, OPIATESCRSER, OXYSCRSER, PROPOXSCRSER, ETH Historical Background Evaluation: [place] PMP: PDMP not reviewed  this encounter. Six (6) year initial data search conducted.             PMP NARX Score Report:  Narcotic: *** Sedative: *** Stimulant: *** [place] Department of public safety, offender search: Editor, commissioning Information) Non-contributory Risk Assessment Profile: Aberrant behavior: None observed or detected today Risk factors for fatal opioid overdose: None identified today PMP NARX Overdose Risk Score: *** Fatal overdose hazard ratio (HR): Calculation deferred Non-fatal overdose hazard ratio (HR): Calculation deferred Risk of opioid abuse or dependence: 0.7-3.0% with doses ? 36 MME/day and 6.1-26% with doses ? 120 MME/day. Substance use disorder (SUD) risk level: See below Personal History of Substance Abuse (SUD-Substance use disorder):  Alcohol:    Illegal Drugs:    Rx Drugs:    ORT Risk Level calculation:    ORT Scoring interpretation table:  Score <3 = Low Risk for SUD  Score between 4-7 = Moderate Risk for SUD  Score >8 = High Risk for Opioid Abuse   PHQ-2 Depression Scale:  Total score:    PHQ-2 Scoring interpretation table: (Score and probability of major depressive disorder)  Score 0 = No depression  Score 1 = 15.4% Probability  Score 2 = 21.1% Probability  Score 3 = 38.4% Probability  Score 4 = 45.5% Probability  Score 5 = 56.4% Probability  Score 6 = 78.6% Probability   PHQ-9 Depression Scale:  Total score:    PHQ-9 Scoring interpretation table:  Score 0-4 = No depression  Score 5-9 = Mild depression  Score 10-14 = Moderate depression  Score 15-19 = Moderately severe depression  Score 20-27 = Severe depression (2.4 times higher risk of SUD and 2.89 times higher risk of overuse)   Pharmacologic Plan: As per protocol, I have  not taken over any controlled substance management, pending the results of ordered tests and/or consults.            Initial impression: Pending review of available data and ordered tests.  Meds   Current Outpatient Medications:  .  ALPRAZolam  (XANAX) 1 MG tablet, Take 1 mg by mouth at bedtime., Disp: , Rfl: 5 .  amitriptyline (ELAVIL) 50 MG tablet, Take 3 tablets (150 mg total) by mouth at bedtime., Disp: 90 tablet, Rfl: 11 .  baclofen (LIORESAL) 10 MG tablet, Take 10 mg by mouth as needed (Pain). , Disp: , Rfl:  .  butalbital-acetaminophen-caffeine (FIORICET) 50-325-40 MG tablet, TAKE (1) TABLET BY MOUTH EVERY SIX HOURS AS NEEDED. MAX 10 TABS PER MONTH., Disp: 10 tablet, Rfl: 2 .  DULoxetine (CYMBALTA) 30 MG capsule, Take 2 capsules (60 mg total) by mouth at bedtime., Disp: 60 capsule, Rfl: 5 .  gabapentin (NEURONTIN) 300 MG capsule, TAKE 2 CAPSULES BY MOUTH 3 TIMES DAILY., Disp: 180 capsule, Rfl: 3 .  Galcanezumab-gnlm (EMGALITY) 120 MG/ML SOAJ, Inject 120 mg into the skin every 30 (thirty) days., Disp: 1 pen, Rfl: 11 .  levothyroxine (SYNTHROID, LEVOTHROID) 150 MCG tablet, Take 150 mcg by mouth daily before breakfast., Disp: , Rfl:  .  Multiple Vitamin (MULTIVITAMIN WITH MINERALS) TABS tablet, Take 1 tablet by mouth at bedtime., Disp: , Rfl:  .  ondansetron (ZOFRAN ODT) 4 MG disintegrating tablet, Take 1-2 tablets (4 mg total) by mouth every 8 (eight) hours as needed (Patient taking differently: Take 4-8 mg by mouth every 8 (eight) hours as needed for nausea or vomiting. Take 1-2 tablets (4 mg total) by mouth every 8 (eight) hours as needed), Disp: 30 tablet, Rfl: 12 .  polyethylene glycol powder (GLYCOLAX/MIRALAX) 17 GM/SCOOP powder, Take 17 g by mouth daily., Disp: 507 g, Rfl: 0 .  PREMARIN 0.3 MG tablet, daily., Disp: , Rfl:  .  progesterone (PROMETRIUM) 100 MG capsule, daily., Disp: , Rfl:  .  psyllium (METAMUCIL SMOOTH TEXTURE) 58.6 % powder, Take 1 packet by mouth daily., Disp: , Rfl:  .  Rimegepant Sulfate (NURTEC) 75 MG TBDP, Take 1 tablet by mouth daily as needed. (Patient taking differently: Take 1 tablet by mouth daily as needed (Migraine). ), Disp: 8 tablet, Rfl: 3 .  SUMAtriptan (IMITREX) 100 MG tablet, Take 100 mg by mouth  daily as needed for migraine. , Disp: , Rfl:  .  SUMAtriptan 6 MG/0.5ML SOAJ, Inject 6 mg into the skin as needed (Migraine). , Disp: , Rfl:  .  tiZANidine (ZANAFLEX) 4 MG tablet, Take 4 mg by mouth every 6 (six) hours as needed (pain). , Disp: , Rfl:  .  Ubrogepant (UBRELVY) 100 MG TABS, Take 1 tablet by mouth as needed (May repeat dose after 2 hours if needed.  Maximum 2 tablets in 24 hours)., Disp: 16 tablet, Rfl: 3 .  valACYclovir (VALTREX) 1000 MG tablet, Take 1 tablet (1,000 mg total) by mouth 2 (two) times daily., Disp: 20 tablet, Rfl: 0 .  vitamin C (ASCORBIC ACID) 500 MG tablet, Take 500 mg by mouth at bedtime., Disp: , Rfl:   Imaging Review  Cervical Imaging: Cervical MR wo contrast:  Results for orders placed during the hospital encounter of 04/20/17  MR CERVICAL SPINE WO CONTRAST   Narrative CLINICAL DATA:  Migraines with pain extending through the neck since 03 -15  EXAM: MRI CERVICAL SPINE WITHOUT CONTRAST  TECHNIQUE: Multiplanar, multisequence MR imaging of the cervical spine was performed. No  intravenous contrast was administered.  COMPARISON:  None.  FINDINGS: Alignment: Physiologic.  Vertebrae: No fracture, evidence of discitis, or bone lesion.  Cord: Normal signal and morphology.  Posterior Fossa, vertebral arteries, paraspinal tissues: Negative.  Disc levels:  Small disc protrusion at C5-6 and C6-7, without visible neural contact. Negative facets. No impingement.  IMPRESSION: 1. No explanation for radiculopathy and headache. 2. Small noncompressive disc protrusions at C5-6 and C6-7.   Electronically Signed   By: Monte Fantasia M.D.   On: 04/20/2017 17:37    Cervical MR wo contrast: No procedure found. Cervical MR w/wo contrast: No results found for this or any previous visit. Cervical MR w contrast: No results found for this or any previous visit. Cervical CT wo contrast: No results found for this or any previous visit. Cervical CT w/wo  contrast: No results found for this or any previous visit. Cervical CT w/wo contrast: No results found for this or any previous visit. Cervical CT w contrast: No results found for this or any previous visit. Cervical CT outside: No results found for this or any previous visit. Cervical DG 1 view: No results found for this or any previous visit. Cervical DG 2-3 views: No results found for this or any previous visit. Cervical DG F/E views: No results found for this or any previous visit. Cervical DG 2-3 clearing views: No results found for this or any previous visit. Cervical DG Bending/F/E views: No results found for this or any previous visit. Cervical DG complete: No results found for this or any previous visit. Cervical DG Myelogram views: No results found for this or any previous visit. Cervical DG Myelogram views: No results found for this or any previous visit. Cervical Discogram views: No results found for this or any previous visit.  Shoulder Imaging: Shoulder-R MR w contrast: No results found for this or any previous visit. Shoulder-L MR w contrast: No results found for this or any previous visit. Shoulder-R MR w/wo contrast: No results found for this or any previous visit. Shoulder-L MR w/wo contrast: No results found for this or any previous visit. Shoulder-R MR wo contrast: No results found for this or any previous visit. Shoulder-L MR wo contrast: No results found for this or any previous visit. Shoulder-R CT w contrast: No results found for this or any previous visit. Shoulder-L CT w contrast: No results found for this or any previous visit. Shoulder-R CT w/wo contrast: No results found for this or any previous visit. Shoulder-L CT w/wo contrast: No results found for this or any previous visit. Shoulder-R CT wo contrast: No results found for this or any previous visit. Shoulder-L CT wo contrast: No results found for this or any previous visit. Shoulder-R DG Arthrogram: No  results found for this or any previous visit. Shoulder-L DG Arthrogram: No results found for this or any previous visit. Shoulder-R DG 1 view: No results found for this or any previous visit. Shoulder-L DG 1 view: No results found for this or any previous visit. Shoulder-R DG: No results found for this or any previous visit. Shoulder-L DG: No results found for this or any previous visit.  Thoracic Imaging: Thoracic MR wo contrast: No results found for this or any previous visit. Thoracic MR wo contrast: No procedure found. Thoracic MR w/wo contrast: No results found for this or any previous visit. Thoracic MR w contrast: No results found for this or any previous visit. Thoracic CT wo contrast: No results found for this or any previous visit.  Thoracic CT w/wo contrast: No results found for this or any previous visit. Thoracic CT w/wo contrast: No results found for this or any previous visit. Thoracic CT w contrast: No results found for this or any previous visit. Thoracic DG 2-3 views: No results found for this or any previous visit. Thoracic DG 4 views: No results found for this or any previous visit. Thoracic DG: No results found for this or any previous visit. Thoracic DG w/swimmers view: No results found for this or any previous visit. Thoracic DG Myelogram views: No results found for this or any previous visit. Thoracic DG Myelogram views: No results found for this or any previous visit.  Lumbosacral Imaging: Lumbar MR wo contrast: No results found for this or any previous visit. Lumbar MR wo contrast: No procedure found. Lumbar MR w/wo contrast: No results found for this or any previous visit. Lumbar MR w/wo contrast: No results found for this or any previous visit. Lumbar MR w contrast: No results found for this or any previous visit. Lumbar CT wo contrast: No results found for this or any previous visit. Lumbar CT w/wo contrast: No results found for this or any previous  visit. Lumbar CT w/wo contrast: No results found for this or any previous visit. Lumbar CT w contrast: No results found for this or any previous visit. Lumbar DG 1V: No results found for this or any previous visit. Lumbar DG 1V (Clearing): No results found for this or any previous visit. Lumbar DG 2-3V (Clearing): No results found for this or any previous visit. Lumbar DG 2-3 views: No results found for this or any previous visit. Lumbar DG (Complete) 4+V:  Results for orders placed during the hospital encounter of 03/05/17  DG Lumbar Spine Complete   Narrative CLINICAL DATA:  Status post fall on Saturday with low back pain.  EXAM: LUMBAR SPINE - COMPLETE 4+ VIEW  COMPARISON:  None.  FINDINGS: There is no evidence of lumbar spine fracture. Alignment is normal. Intervertebral disc spaces are maintained.  IMPRESSION: Negative.   Electronically Signed   By: Abelardo Diesel M.D.   On: 03/05/2017 17:26          Lumbar DG F/E views: No results found for this or any previous visit.       Lumbar DG Bending views: No results found for this or any previous visit.       Lumbar DG Myelogram views: No results found for this or any previous visit. Lumbar DG Myelogram: No results found for this or any previous visit. Lumbar DG Myelogram: No results found for this or any previous visit. Lumbar DG Myelogram: No results found for this or any previous visit. Lumbar DG Myelogram Lumbosacral: No results found for this or any previous visit. Lumbar DG Diskogram views: No results found for this or any previous visit. Lumbar DG Diskogram views: No results found for this or any previous visit. Lumbar DG Epidurogram OP: No results found for this or any previous visit. Lumbar DG Epidurogram IP: No results found for this or any previous visit.  Sacroiliac Joint Imaging: Sacroiliac Joint DG: No results found for this or any previous visit. Sacroiliac Joint MR w/wo contrast: No results found for this or  any previous visit. Sacroiliac Joint MR wo contrast: No results found for this or any previous visit.  Spine Imaging: Whole Spine DG Myelogram views: No results found for this or any previous visit. Whole Spine MR Mets screen: No results found for this or any  previous visit. Whole Spine MR Mets screen: No results found for this or any previous visit. Whole Spine MR w/wo: No results found for this or any previous visit. MRA Spinal Canal w/ cm: No results found for this or any previous visit. MRA Spinal Canal wo/ cm: No procedure found. MRA Spinal Canal w/wo cm: No results found for this or any previous visit. Spine Outside MR Films: No results found for this or any previous visit. Spine Outside CT Films: No results found for this or any previous visit. CT-Guided Biopsy: No results found for this or any previous visit. CT-Guided Needle Placement: No results found for this or any previous visit. DG Spine outside: No results found for this or any previous visit. IR Spine outside: No results found for this or any previous visit. NM Spine outside: No results found for this or any previous visit.  Hip Imaging: Hip-R MR w contrast: No results found for this or any previous visit. Hip-L MR w contrast: No results found for this or any previous visit. Hip-R MR w/wo contrast: No results found for this or any previous visit. Hip-L MR w/wo contrast: No results found for this or any previous visit. Hip-R MR wo contrast: No results found for this or any previous visit. Hip-L MR wo contrast: No results found for this or any previous visit. Hip-R CT w contrast: No results found for this or any previous visit. Hip-L CT w contrast: No results found for this or any previous visit. Hip-R CT w/wo contrast: No results found for this or any previous visit. Hip-L CT w/wo contrast: No results found for this or any previous visit. Hip-R CT wo contrast: No results found for this or any previous visit. Hip-L CT wo  contrast: No results found for this or any previous visit. Hip-R DG 2-3 views: No results found for this or any previous visit. Hip-L DG 2-3 views: No results found for this or any previous visit. Hip-R DG Arthrogram: No results found for this or any previous visit. Hip-L DG Arthrogram: No results found for this or any previous visit. Hip-B DG Bilateral: No results found for this or any previous visit.  Knee Imaging: Knee-R MR w contrast: No results found for this or any previous visit. Knee-L MR w contrast: No results found for this or any previous visit. Knee-R MR w/wo contrast: No results found for this or any previous visit. Knee-L MR w/wo contrast: No results found for this or any previous visit. Knee-R MR wo contrast: No results found for this or any previous visit. Knee-L MR wo contrast: No results found for this or any previous visit. Knee-R CT w contrast: No results found for this or any previous visit. Knee-L CT w contrast: No results found for this or any previous visit. Knee-R CT w/wo contrast: No results found for this or any previous visit. Knee-L CT w/wo contrast: No results found for this or any previous visit. Knee-R CT wo contrast: No results found for this or any previous visit. Knee-L CT wo contrast: No results found for this or any previous visit. Knee-R DG 1-2 views: No results found for this or any previous visit. Knee-L DG 1-2 views: No results found for this or any previous visit. Knee-R DG 3 views: No results found for this or any previous visit. Knee-L DG 3 views: No results found for this or any previous visit. Knee-R DG 4 views: No results found for this or any previous visit. Knee-L DG 4 views:  No results found for this or any previous visit. Knee-R DG Arthrogram: No results found for this or any previous visit. Knee-L DG Arthrogram: No results found for this or any previous visit.  Ankle Imaging: Ankle-R DG Complete: No results found for this or any  previous visit. Ankle-L DG Complete: No results found for this or any previous visit.  Foot Imaging: Foot-R DG Complete: No results found for this or any previous visit. Foot-L DG Complete: No results found for this or any previous visit.  Elbow Imaging: Elbow-R DG Complete: No results found for this or any previous visit. Elbow-L DG Complete: No results found for this or any previous visit.  Wrist Imaging: Wrist-R DG Complete: No results found for this or any previous visit. Wrist-L DG Complete: No results found for this or any previous visit.  Hand Imaging: Hand-R DG Complete: No results found for this or any previous visit. Hand-L DG Complete: No results found for this or any previous visit.  Complexity Note: Imaging results reviewed. Results shared with Martha Aguilar, using Layman's terms.                         ROS  Cardiovascular: {Hx; Cardiovascular History:210120525} Pulmonary or Respiratory: {Hx; Pumonary and/or Respiratory History:210120523} Neurological: {Hx; Neurological:210120504} Review of Past Neurological Studies:  Results for orders placed or performed during the hospital encounter of 04/20/17  MR BRAIN W WO CONTRAST   Narrative   CLINICAL DATA:  Chronic intractable headache. Acute headache, worst of life  EXAM: MRI HEAD WITHOUT AND WITH CONTRAST  TECHNIQUE: Multiplanar, multiecho pulse sequences of the brain and surrounding structures were obtained without and with intravenous contrast.  CONTRAST:  30m MULTIHANCE GADOBENATE DIMEGLUMINE 529 MG/ML IV SOLN  COMPARISON:  10/18/2006 head CT  FINDINGS: Brain: No infarction, hemorrhage, hydrocephalus, extra-axial collection or mass lesion. 3 or 4 FLAIR hyperintense foci in the cerebral white matter from nonspecific remote insult, usually considered incidental when seen to this degree. There is no specific demyelinating pattern.  Vascular: Normal flow voids.  Skull and upper cervical spine: Normal marrow  signal.  Sinuses/Orbits: Negative.  IMPRESSION: No specific explanation for headache.   Electronically Signed   By: JMonte FantasiaM.D.   On: 04/20/2017 17:42   Results for orders placed or performed during the hospital encounter of 10/18/06  CT Head Wo Contrast   Narrative   Clinical data: Severe headaches. Recurrent sinusitis.   HEAD CT WITHOUT CONTRAST:  Technique: Contiguous axial images were obtained from the base of the skull through the vertex according to standard protocol without contrast.  Findings: There is no evidence of intracranial hemorrhage, brain edema, acute infarct, mass lesion, or mass effect.  No other intra-axial abnormalities are seen, and the ventricles are within normal limits.  No abnormal extra-axial fluid collections or masses are identified.  No skull abnormalities are noted.  IMPRESSION:  Negative non-contrast head CT.  LIMITED CT OF PARANASAL SINUSES:  Technique: Limited coronal CT images were obtained through the paranasal sinuses without intravenous contrast.  Findings: The paranasal sinuses are clear. There is no evidence of sinus air fluid levels or mucosal thickening.   IMPRESSION:  Negative. No evidence of sinusitis.  Provider: DBonna Gains  Psychological-Psychiatric: {Hx; Psychological-Psychiatric History:210120512} Gastrointestinal: {Hx; Gastrointestinal:210120527} Genitourinary: {Hx; Genitourinary:210120506} Hematological: {Hx; Hematological:210120510} Endocrine: {Hx; Endocrine history:210120509} Rheumatologic: {Hx; Rheumatological:210120530} Musculoskeletal: {Hx; Musculoskeletal:210120528} Work History: {Hx; Work history:210120514}  Allergies  Martha Aguilar is allergic to aimovig [erenumab]; other; and compazine [prochlorperazine].  Laboratory Chemistry Profile   Screening Lab Results  Component Value Date   SARSCOV2NAA NEGATIVE 10/22/2018   COVIDSOURCE NASOPHARYNGEAL 06/10/2018   PREGTESTUR NEGATIVE 03/05/2017     Inflammation (CRP: Acute Phase) (ESR: Chronic Phase) Lab Results  Component Value Date   LATICACIDVEN 0.43 (L) 07/11/2015                         Rheumatology No results found for: RF, ANA, LABURIC, URICUR, LYMEIGGIGMAB, LYMEABIGMQN, HLAB27                      Renal Lab Results  Component Value Date   BUN 18 06/04/2018   CREATININE 0.78 06/04/2018   GFRAA >60 06/04/2018   GFRNONAA >60 06/04/2018                             Hepatic Lab Results  Component Value Date   AST 30 06/04/2018   ALT 44 06/04/2018   ALBUMIN 4.1 06/04/2018   ALKPHOS 100 06/04/2018   LIPASE 66 (H) 06/04/2018                        Electrolytes Lab Results  Component Value Date   NA 142 06/04/2018   K 3.9 06/04/2018   CL 103 06/04/2018   CALCIUM 9.5 06/04/2018   MG 1.9 10/19/2011   PHOS 3.6 10/19/2011                        Neuropathy No results found for: VITAMINB12, FOLATE, HGBA1C, HIV                      CNS No results found for: COLORCSF, APPEARCSF, RBCCOUNTCSF, WBCCSF, POLYSCSF, LYMPHSCSF, EOSCSF, PROTEINCSF, GLUCCSF, JCVIRUS, CSFOLI, IGGCSF, LABACHR, ACETBL                      Bone No results found for: [place], VD125OH2TOT, [id number], [id number], 25OHVITD1, 25OHVITD2, 25OHVITD3, TESTOFREE, TESTOSTERONE                       Coagulation Lab Results  Component Value Date   PLT 270 06/04/2018                        Cardiovascular Lab Results  Component Value Date   TROPONINI <0.03 08/04/2016   HGB 14.8 06/04/2018   HCT 43.9 06/04/2018                         ID Lab Results  Component Value Date   SARSCOV2NAA NEGATIVE 10/22/2018   PREGTESTUR NEGATIVE 03/05/2017    Cancer No results found for: CEA, CA125, LABCA2                      Endocrine Lab Results  Component Value Date   TSH 3.572 08/04/2016                        Note: Lab results reviewed.  PFSH  Drug: Martha Aguilar  reports no history of drug use. Alcohol:  reports current alcohol use. Tobacco:   reports that she has never smoked. She has never used smokeless tobacco. Medical:  has a past medical history of Headache(784.0), Hypothyroidism, Migraines, and Occipital  neuralgia. Family: family history includes Migraines in her mother and another family member; Prostate cancer in her father; Seizures in her brother.  Past Surgical History:  Procedure Laterality Date  . BIOPSY  10/23/2018   Procedure: BIOPSY;  Surgeon: Rogene Houston, MD;  [facility]: [facility];  Service: Endoscopy;;  . COLONOSCOPY N/A 10/23/2018   Procedure: COLONOSCOPY;  Surgeon: Rogene Houston, MD;  [facility]: [facility];  Service: Endoscopy;  Laterality: N/A;  1:00  . ESOPHAGOGASTRODUODENOSCOPY (EGD) WITH PROPOFOL N/A 06/13/2018   Procedure: ESOPHAGOGASTRODUODENOSCOPY (EGD) WITH PROPOFOL;  Surgeon: Rogene Houston, MD;  Location: [facility];  Service: Endoscopy;  Laterality: N/A;  . POLYPECTOMY  10/23/2018   Procedure: POLYPECTOMY;  Surgeon: Rogene Houston, MD;  Location: [facility];  Service: Endoscopy;;  . TUBAL LIGATION    . uterine ablation    . uterine polyps     Active Ambulatory Problems    Diagnosis Date Noted  . Nausea & vomiting 10/19/2011  . Suprapubic abdominal pain 10/19/2011  . Dehydration 10/19/2011  . Hypothyroidism 10/19/2011  . Migraines 10/19/2011  . Acute kidney injury ([place]) 10/20/2011  . Abdominal pain, epigastric 07/03/2013  . Chronic migraine w/o aura w/o status migrainosus, not intractable 08/22/2015  . Right upper quadrant pain 06/06/2018  . Constipation 09/05/2018  . Abdominal bloating 09/05/2018   Resolved Ambulatory Problems    Diagnosis Date Noted  . Recurrent UTI 10/19/2011   Past Medical History:  Diagnosis Date  . Headache(784.0)   . Occipital neuralgia    Constitutional Exam  General appearance: Well nourished, well developed, and well hydrated. In no apparent acute distress There were no vitals filed for this visit. BMI Assessment: Estimated body mass  index is 22.4 kg/m as calculated from the following:   Height as of 09/06/18: 5' 7"  (1.702 m).   Weight as of 09/06/18: 143 lb (64.9 kg).  BMI interpretation table: BMI level Category Range association with higher incidence of chronic pain  <18 kg/m2 Underweight   18.5-24.9 kg/m2 Ideal body weight   25-29.9 kg/m2 Overweight Increased incidence by 20%  30-34.9 kg/m2 Obese (Class I) Increased incidence by 68%  35-39.9 kg/m2 Severe obesity (Class II) Increased incidence by 136%  >40 kg/m2 Extreme obesity (Class III) Increased incidence by 254%   Patient's current BMI Ideal Body weight  There is no height or weight on file to calculate BMI. Patient weight not recorded   BMI Readings from Last 4 Encounters:  09/06/18 22.40 kg/m  09/05/18 23.20 kg/m  07/01/18 21.44 kg/m  06/13/18 22.79 kg/m   Wt Readings from Last 4 Encounters:  09/06/18 143 lb (64.9 kg)  09/05/18 148 lb 1.6 oz (67.2 kg)  07/01/18 140 lb (63.5 kg)  06/13/18 147 lb 11.3 oz (67 kg)  Psych/Mental status: Alert, oriented x 3 (person, place, & time)       Eyes: PERLA Respiratory: No evidence of acute respiratory distress  Cervical Spine Area Exam  Skin & Axial Inspection: No masses, redness, edema, swelling, or associated skin lesions Alignment: Symmetrical Functional ROM: Unrestricted ROM      Stability: No instability detected Muscle Tone/Strength: Functionally intact. No obvious neuro-muscular anomalies detected. Sensory (Neurological): Unimpaired Palpation: No palpable anomalies              Upper Extremity (UE) Exam    Side: Right upper extremity  Side: Left upper extremity  Skin & Extremity Inspection: Skin color, temperature, and hair growth are WNL. No peripheral edema or cyanosis. No  masses, redness, swelling, asymmetry, or associated skin lesions. No contractures.  Skin & Extremity Inspection: Skin color, temperature, and hair growth are WNL. No peripheral edema or cyanosis. No masses, redness, swelling,  asymmetry, or associated skin lesions. No contractures.  Functional ROM: Unrestricted ROM          Functional ROM: Unrestricted ROM          Muscle Tone/Strength: Functionally intact. No obvious neuro-muscular anomalies detected.  Muscle Tone/Strength: Functionally intact. No obvious neuro-muscular anomalies detected.  Sensory (Neurological): Unimpaired          Sensory (Neurological): Unimpaired          Palpation: No palpable anomalies              Palpation: No palpable anomalies              Provocative Test(s):  Phalen's test: deferred Tinel's test: deferred Apley's scratch test (touch opposite shoulder):  Action 1 (Across chest): deferred Action 2 (Overhead): deferred Action 3 (LB reach): deferred   Provocative Test(s):  Phalen's test: deferred Tinel's test: deferred Apley's scratch test (touch opposite shoulder):  Action 1 (Across chest): deferred Action 2 (Overhead): deferred Action 3 (LB reach): deferred    Thoracic Spine Area Exam  Skin & Axial Inspection: No masses, redness, or swelling Alignment: Symmetrical Functional ROM: Unrestricted ROM Stability: No instability detected Muscle Tone/Strength: Functionally intact. No obvious neuro-muscular anomalies detected. Sensory (Neurological): Unimpaired Muscle strength & Tone: No palpable anomalies  Lumbar Spine Area Exam  Skin & Axial Inspection: No masses, redness, or swelling Alignment: Symmetrical Functional ROM: Unrestricted ROM       Stability: No instability detected Muscle Tone/Strength: Functionally intact. No obvious neuro-muscular anomalies detected. Sensory (Neurological): Unimpaired Palpation: No palpable anomalies       Provocative Tests: Hyperextension/rotation test: deferred today       Lumbar quadrant test (Kemp's test): deferred today       Lateral bending test: deferred today       Patrick's Maneuver: deferred today                   FABER* test: deferred today                   S-I anterior  distraction/compression test: deferred today         S-I lateral compression test: deferred today         S-I Thigh-thrust test: deferred today         S-I Gaenslen's test: deferred today         *(Flexion, ABduction and External Rotation)  Gait & Posture Assessment  Ambulation: Unassisted Gait: Relatively normal for age and body habitus Posture: WNL   Lower Extremity Exam    Side: Right lower extremity  Side: Left lower extremity  Stability: No instability observed          Stability: No instability observed          Skin & Extremity Inspection: Skin color, temperature, and hair growth are WNL. No peripheral edema or cyanosis. No masses, redness, swelling, asymmetry, or associated skin lesions. No contractures.  Skin & Extremity Inspection: Skin color, temperature, and hair growth are WNL. No peripheral edema or cyanosis. No masses, redness, swelling, asymmetry, or associated skin lesions. No contractures.  Functional ROM: Unrestricted ROM                  Functional ROM: Unrestricted ROM  Muscle Tone/Strength: Functionally intact. No obvious neuro-muscular anomalies detected.  Muscle Tone/Strength: Functionally intact. No obvious neuro-muscular anomalies detected.  Sensory (Neurological): Unimpaired        Sensory (Neurological): Unimpaired        DTR: Patellar: deferred today Achilles: deferred today Plantar: deferred today  DTR: Patellar: deferred today Achilles: deferred today Plantar: deferred today  Palpation: No palpable anomalies  Palpation: No palpable anomalies   Assessment  Primary Diagnosis & Pertinent Problem List: There were no encounter diagnoses.  Visit Diagnosis (New problems to examiner): No diagnosis found. Plan of Care (Initial workup plan)  Note: Martha Aguilar was reminded that as per protocol, today's visit has been an evaluation only. We have not taken over the patient's controlled substance management.  Problem-specific plan: No  problem-specific Assessment & Plan notes found for this encounter.  Lab Orders  No laboratory test(s) ordered today   Imaging Orders  No imaging studies ordered today   Referral Orders  No referral(s) requested today   Procedure Orders    No procedure(s) ordered today   Pharmacotherapy (current): Medications ordered:  No orders of the defined types were placed in this encounter.  Medications administered during this visit: Martha Aguilar had no medications administered during this visit.   Pharmacological management options:  Opioid Analgesics: The patient was informed that there is no guarantee that she would be a candidate for opioid analgesics. The decision will be made following CDC guidelines. This decision will be based on the results of diagnostic studies, as well as Ms. Gallier's risk profile.   Membrane stabilizer: To be determined at a later time  Muscle relaxant: To be determined at a later time  NSAID: To be determined at a later time  Other analgesic(s): To be determined at a later time   Interventional management options: Martha Aguilar was informed that there is no guarantee that she would be a candidate for interventional therapies. The decision will be based on the results of diagnostic studies, as well as Martha Aguilar's risk profile.  Procedure(s) under consideration:  ***   Provider-requested follow-up: No follow-ups on file.  Future Appointments  Date Time Provider [place]  11/28/2018  2:00 PM Gillis Santa, MD [facility] None  01/13/2019  2:30 PM Pieter Partridge, DO [facility] None    Primary Care Physician: Sinda Du, MD Location: [facility] Note by: Gillis Santa, MD Date: 11/28/2018; Time: 3:19 PM  Note: This dictation was prepared with Dragon dictation. Any transcriptional errors that may result from this process are unintentional.

## 2018-11-28 ENCOUNTER — Encounter: Payer: Self-pay | Admitting: Student in an Organized Health Care Education/Training Program

## 2018-11-28 ENCOUNTER — Ambulatory Visit
Payer: BC Managed Care – PPO | Attending: Student in an Organized Health Care Education/Training Program | Admitting: Student in an Organized Health Care Education/Training Program

## 2018-11-28 ENCOUNTER — Ambulatory Visit: Payer: BC Managed Care – PPO | Admitting: Student in an Organized Health Care Education/Training Program

## 2018-11-28 ENCOUNTER — Other Ambulatory Visit: Payer: Self-pay

## 2018-11-28 ENCOUNTER — Other Ambulatory Visit: Payer: Self-pay | Admitting: Neurology

## 2018-11-28 VITALS — BP 128/65 | HR 97 | Resp 16 | Ht 67.0 in | Wt 140.0 lb

## 2018-11-28 DIAGNOSIS — M5481 Occipital neuralgia: Secondary | ICD-10-CM

## 2018-11-28 DIAGNOSIS — G894 Chronic pain syndrome: Secondary | ICD-10-CM

## 2018-11-28 DIAGNOSIS — G43809 Other migraine, not intractable, without status migrainosus: Secondary | ICD-10-CM | POA: Diagnosis not present

## 2018-11-28 DIAGNOSIS — M792 Neuralgia and neuritis, unspecified: Secondary | ICD-10-CM

## 2018-11-28 MED ORDER — ALPHA-LIPOIC ACID 600 MG PO CAPS: 600.0000 mg | ORAL_CAPSULE | Freq: Every day | ORAL | 0 refills | Status: AC

## 2018-11-28 MED ORDER — NONFORMULARY OR COMPOUNDED ITEM: 2 refills | Status: AC

## 2018-11-28 NOTE — Telephone Encounter (Signed)
OK to refill as have taken over her care
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
## 2018-11-28 NOTE — Telephone Encounter (Signed)
Noted Provider approved and sent rx to pharmacy

## 2018-11-28 NOTE — Telephone Encounter (Signed)
Requested Prescriptions   Pending Prescriptions Disp Refills  . amitriptyline (ELAVIL) 150 MG tablet [Pharmacy Med Name: AMITRIPTYLINE HCL 150 MG TAB] 30 tablet 0    Sig: TAKE (1) TABLET BY MOUTH AT BEDTIME.   Rx last filled:07/16/17 #[id number] REFILLS BY Dr. Joya Martyr  Pt last seen: 09/06/18   Follow up appt scheduled:02/02/19

## 2018-11-28 NOTE — Progress Notes (Signed)
Called for Peer to Peer spoke with Wynona Canes  Appeal approved Valid until 11/24/2019 Ref# [id number]

## 2018-11-28 NOTE — Progress Notes (Signed)
Safety precautions to be maintained throughout the outpatient stay will include: orient to surroundings, keep bed in low position, maintain call bell within reach at all times, provide assistance with transfer out of bed and ambulation.  

## 2018-12-03 ENCOUNTER — Ambulatory Visit: Payer: BC Managed Care – PPO | Attending: Pulmonary Disease

## 2018-12-10 ENCOUNTER — Encounter: Payer: Self-pay | Admitting: Student in an Organized Health Care Education/Training Program

## 2018-12-16 DIAGNOSIS — D0359 Melanoma in situ of other part of trunk: Secondary | ICD-10-CM | POA: Diagnosis not present

## 2018-12-16 DIAGNOSIS — D2272 Melanocytic nevi of left lower limb, including hip: Secondary | ICD-10-CM | POA: Diagnosis not present

## 2018-12-16 DIAGNOSIS — D225 Melanocytic nevi of trunk: Secondary | ICD-10-CM | POA: Diagnosis not present

## 2018-12-16 DIAGNOSIS — Z1283 Encounter for screening for malignant neoplasm of skin: Secondary | ICD-10-CM | POA: Diagnosis not present

## 2018-12-16 DIAGNOSIS — L258 Unspecified contact dermatitis due to other agents: Secondary | ICD-10-CM | POA: Diagnosis not present

## 2018-12-16 DIAGNOSIS — D485 Neoplasm of uncertain behavior of skin: Secondary | ICD-10-CM | POA: Diagnosis not present

## 2018-12-25 DIAGNOSIS — M5481 Occipital neuralgia: Secondary | ICD-10-CM | POA: Diagnosis not present

## 2018-12-25 DIAGNOSIS — Z5181 Encounter for therapeutic drug level monitoring: Secondary | ICD-10-CM | POA: Diagnosis not present

## 2018-12-25 DIAGNOSIS — Z79891 Long term (current) use of opiate analgesic: Secondary | ICD-10-CM | POA: Diagnosis not present

## 2019-01-01 DIAGNOSIS — M5481 Occipital neuralgia: Secondary | ICD-10-CM | POA: Diagnosis not present

## 2019-01-07 ENCOUNTER — Encounter: Payer: Self-pay | Admitting: Neurology

## 2019-01-12 NOTE — Progress Notes (Signed)
Virtual Visit via Video Note The purpose of this virtual visit is to provide medical care while limiting exposure to the novel coronavirus.    Consent was obtained for video visit:  Yes Answered questions that patient had about telehealth interaction:  Yes I discussed the limitations, risks, security and privacy concerns of performing an evaluation and management service by telemedicine. I also discussed with the patient that there may be a patient responsible charge related to this service. The patient expressed understanding and agreed to proceed.  Pt location: Home Physician Location: office Name of referring provider:  Sinda Du, MD I connected with Martha Aguilar at patients initiation/request on 01/13/2019 at  2:30 PM EST by video enabled telemedicine application and verified that I am speaking with the correct person using two identifiers. Pt MRN:  [id number] Pt DOB:  Oct 09, 1970 Video Participants:  Martha Mccreedy Yeomans   History of Present Illness:  Martha Aguilar is a 49 year old right-handed woman who follows up for migraines and occipital neuralgia.  UPDATE: Started on Emgality in August.  She was also prescribed Nurtec for abortive therapy.  She was started on Cymbalta for neuralgia and Lamictal and Lyrica were discontinued.  Occipital neuralgia persisted and she was referred to pain management.  Plan is to undergo lidocaine infusion.    Migraines overall are improved. Intensity:Moderate to severe Duration:1 day Frequency:2 a week Current NSAIDS:None  Rescue therapy:  Sumatriptan with Nurtec; sumatriptan [place] if wakes up with migraine. Current analgesics: none Current triptans:Sumatriptan 100mg (gradual onset during day), sumatriptan 6mg  [place](if wakes up with it) Current ergotamine:None Current anti-emetic:Zofran ODT 4mg  Current muscle relaxants:baclofen 10mg  three times daily Current anti-anxiolytic:alprazolam Current sleep  aide:alprazolam Current Antihypertensive medications:None Current Antidepressant medications:Amitriptyline 150mg ; Cymbalta 60mg  daily Current Anticonvulsant medications: gabapentin 600mg  three times daily Current anti-CGRP:Emgality, Nurtec Current Vitamins/Herbal/Supplements:Magnesium, B12, D, C, riboflavin, CoQ10, butterbur, alpha-lipoic acid Current Antihistamines/Decongestants:None Other therapy:none Hormone/birth control: Estrogen  Caffeine:No coffee. 1 Coke a day Diet:Drinks 24 to 36 oz water daily Depression:yes; Anxiety:yes Other pain:No Sleep hygiene:better  HISTORY: Onset:  49 years old Location:Middle of head,frontal Quality:Pounding, pressure Initial intensity:Severe.Shedenies new headache, thunderclap headache Aura:no Prodrome:no Postdrome:no Associated symptoms: Photophobia, phonophobia.Shedenies associated nausea, vomiting, visual disturbance/aura, autonomic symptoms,unilateral numbness or weakness. InitialDuration:Within 3 hours with sumatriptan injection InitialFrequency:Once a week InitialFrequency of abortive medication:every other day to every 2 days Triggers:  Wine, change in weather, fatigue Exacerbating factors: movement Relieving factors: Laying in darkand quiet room. Activity:aggravates  She has associated neck painthat started on March 2019. She also has bilateral occipital neuralgia described as burning sensation in back of head. She underwent decompression excision of occipital nerves.  MRI of brain with and without contrast from 04/20/17 personally reviewed and was unremarkable. MRI of cervical spine without contrast from 04/20/17 also personally reviewed and demonstrated unremarkable small disc protrusions at C5-6 and C6-7 but no explanation for headaches. She was evaluated at [facility] and underwent a lumbar puncture on 07/11/17 which demonstrated an opening and closing pressure of 15 cm H2O.    Past NSAIDS:Ibuprofen, naproxen, Toradol Past steroid: Prednisone Past analgesics:Excedrin, tramadol, Norco, Percocet, Fioricet, Lidocaine 5% patch Past abortive triptans:Sumatriptan tablet/NS/injection, Relpax, Zomig tablet Other past abortive:  Reyvow (ineffective) Past abortive ergotamine:DHE capsule, DHE injection Past muscle relaxants:Flexeril, tizanidine Past anti-emetic:Reglan, Zofran, Compazine Past antihypertensive medications:Atenolol, propranolol, lisinopril, candesartan, HCTZ Past antidepressant medications:Venlafaxine, nortriptyline, Pristiq, Wellbutrin Past anticonvulsant medications:Topiramate, Qudexy,Tegretol,zonisamide, Depakote, gabapentin,Oxtellar XR 600mg  twice daily(rash), oxcarbazepine, Dilantin,Keppra 1000mg  at bedtime, Lyrica 100mg  three times daily, Lamictal 25mg  at bedtime Past anti-CGRP:Aimovig,  Melodie Bouillon Past vitamins/Herbal/Supplements:[place] Past antihistamines/decongestants:none Other past therapies:Botox, greater/lesser occipital nerve blocks/auriculotemporal nerve block/supraorbital nerve block, stem cell therapy, memantine  Family history of headache:Mom had migraines when younger  Past Medical History: Past Medical History:  Diagnosis Date  . Headache(784.0)    migraines  . Hypothyroidism   . Migraines   . Occipital neuralgia     Medications: Outpatient Encounter Medications as of 01/13/2019  Medication Sig  . ALPRAZolam (XANAX) 1 MG tablet Take 1 mg by mouth 2 (two) times daily as needed.   Marland Kitchen amitriptyline (ELAVIL) 150 MG tablet TAKE (1) TABLET BY MOUTH AT BEDTIME.  Marland Kitchen amitriptyline (ELAVIL) 50 MG tablet Take 3 tablets (150 mg total) by mouth at bedtime.  . baclofen (LIORESAL) 10 MG tablet Take 10 mg by mouth 3 (three) times daily.   Martha Aguilar (BOSWELLIA PO) Take by mouth daily. 2 TABS DAILY  . butalbital-acetaminophen-caffeine (FIORICET) 50-325-40 MG tablet TAKE (1) TABLET BY MOUTH EVERY  SIX HOURS AS NEEDED. MAX 10 TABS PER MONTH.  Marland Kitchen Cholecalciferol (VITAMIN D3) 50 MCG (2000 UT) TABS Take by mouth daily.  . DULoxetine (CYMBALTA) 30 MG capsule Take 2 capsules (60 mg total) by mouth at bedtime.  . gabapentin (NEURONTIN) 300 MG capsule TAKE 2 CAPSULES BY MOUTH 3 TIMES DAILY.  . Galcanezumab-gnlm (EMGALITY) 120 MG/ML SOAJ Inject 120 mg into the skin every 30 (thirty) days.  Marland Kitchen levothyroxine (SYNTHROID, LEVOTHROID) 150 MCG tablet Take 150 mcg by mouth daily before breakfast.  . MAGNESIUM PO Take 2,000 mg by mouth daily.  . Melatonin 12 MG TABS Take by mouth daily.  . Multiple Vitamin (MULTIVITAMIN WITH MINERALS) TABS tablet Take 1 tablet by mouth at bedtime.  . Niacin (VITAMIN B-3 PO) Take 500 mg by mouth daily.  . NON FORMULARY 2,100 mg daily. [facility]  . Omega-3 Fatty Acids (FISH OIL) 1000 MG CAPS Take by mouth. 2 tabs daily  . ondansetron (ZOFRAN ODT) 4 MG disintegrating tablet Take 1-2 tablets (4 mg total) by mouth every 8 (eight) hours as needed (Patient taking differently: Take 4-8 mg by mouth every 8 (eight) hours as needed for nausea or vomiting. Take 1-2 tablets (4 mg total) by mouth every 8 (eight) hours as needed)  . polyethylene glycol powder (GLYCOLAX/MIRALAX) 17 GM/SCOOP powder Take 17 g by mouth daily.  Marland Kitchen PREMARIN 0.3 MG tablet daily.  . progesterone (PROMETRIUM) 100 MG capsule daily.  . psyllium (METAMUCIL SMOOTH TEXTURE) 58.6 % powder Take 1 packet by mouth daily.  . Rimegepant Sulfate (NURTEC) 75 MG TBDP Take 1 tablet by mouth daily as needed. (Patient taking differently: Take 1 tablet by mouth daily as needed (Migraine). )  . SUMAtriptan (IMITREX) 100 MG tablet Take 100 mg by mouth daily as needed for migraine.   . SUMAtriptan 6 MG/0.5ML SOAJ Inject 6 mg into the skin as needed (Migraine).   Marland Kitchen tiZANidine (ZANAFLEX) 4 MG tablet Take 4 mg by mouth every 6 (six) hours as needed (pain).   . valACYclovir (VALTREX) 1000 MG tablet Take 1 tablet (1,000 mg total) by mouth  2 (two) times daily.  . vitamin C (ASCORBIC ACID) 500 MG tablet Take 500 mg by mouth at bedtime.  . NONFORMULARY OR COMPOUNDED ITEM Sig: Apply 1-2 gm(s) (2-4 pumps) to affected area, 3-4 times/day. (1 pump = 0.5 gm)  . Ubrogepant (UBRELVY) 100 MG TABS Take 1 tablet by mouth as needed (May repeat dose after 2 hours if needed.  Maximum 2 tablets in 24 hours). (Patient not  taking: Reported on 01/07/2019)   No facility-administered encounter medications on file as of 01/13/2019.    Allergies: Allergies  Allergen Reactions  . Aimovig [Erenumab] Other (See Comments)    Burning in the back of the head, severe migraine  . Other Rash    Oxtellar  Oxcarbazepine   . Compazine [Prochlorperazine] Other (See Comments)    IV form, can take PO    Family History: Family History  Problem Relation Age of Onset  . Migraines Mother        benign small bowel tumor  . Prostate cancer Father   . Seizures Brother        MVA  . Migraines Other     Social History: Social History   Socioeconomic History  . Marital status: Divorced    Spouse name: Not on file  . Number of children: 1  . Years of education: 16  . Highest education level: Associate degree: academic program  Occupational History  . Occupation: Nurse  Tobacco Use  . Smoking status: Never Smoker  . Smokeless tobacco: Never Used  Substance and Sexual Activity  . Alcohol use: Yes    Comment: social  . Drug use: No  . Sexual activity: Not on file  Other Topics Concern  . Not on file  Social History Narrative   Lives with daughter   Caffeine use: rare      Patient is right-handed. She lives in a 2 level home with her adopted daughter. 2 cups coffee day.    Social Determinants of Health   Financial Resource Strain:   . Difficulty of Paying Living Expenses: Not on file  Food Insecurity:   . Worried About Charity fundraiser in the Last Year: Not on file  . Ran Out of Food in the Last Year: Not on file  Transportation Needs:    . Lack of Transportation (Medical): Not on file  . Lack of Transportation (Non-Medical): Not on file  Physical Activity:   . Days of Exercise per Week: Not on file  . Minutes of Exercise per Session: Not on file  Stress:   . Feeling of Stress : Not on file  Social Connections:   . Frequency of Communication with Friends and Family: Not on file  . Frequency of Social Gatherings with Friends and Family: Not on file  . Attends Religious Services: Not on file  . Active Member of Clubs or Organizations: Not on file  . Attends Archivist Meetings: Not on file  . Marital Status: Not on file  Intimate Partner Violence:   . Fear of Current or Ex-Partner: Not on file  . Emotionally Abused: Not on file  . Physically Abused: Not on file  . Sexually Abused: Not on file    Observations/Objective:   Height 5\' 7"  (1.702 m), weight 142 lb (64.4 kg). No acute distress.  Alert and oriented.  Speech fluent and not dysarthric.  Language intact.  Eyes orthophoric on primary gaze.  Face symmetric.  Assessment and Plan:   1.  Chronic migraine without aura, without status migrainosus, not intractable 2.  Bilateral occipital neuralgia.  1.  For preventative management, Emgality 2.  For abortive therapy, Nurtec with sumatriptan tablet; Sumatriptan [place] if wakes up with migraine. 3.  Limit use of pain relievers to no more than 2 days out of week to prevent risk of rebound or medication-overuse headache. 4.  Keep headache diary 5. Pain management for treatment of neuralgia. 6.  Exercise, hydration,  caffeine cessation, sleep hygiene, monitor for and avoid triggers 7.  Consider:  magnesium citrate 400mg  daily, riboflavin 400mg  daily, and coenzyme Q10 100mg  three times daily 8. Always keep in mind that currently taking a hormone or birth control may be a possible trigger or aggravating factor for migraine. 9. Follow up 4 months.   Follow Up Instructions:    -I discussed the assessment and  treatment plan with the patient. The patient was provided an opportunity to ask questions and all were answered. The patient agreed with the plan and demonstrated an understanding of the instructions.   The patient was advised to call back or seek an in-person evaluation if the symptoms worsen or if the condition fails to improve as anticipated.   Dudley Major, DO

## 2019-01-13 ENCOUNTER — Telehealth (INDEPENDENT_AMBULATORY_CARE_PROVIDER_SITE_OTHER): Payer: BC Managed Care – PPO | Admitting: Neurology

## 2019-01-13 ENCOUNTER — Other Ambulatory Visit: Payer: Self-pay

## 2019-01-13 VITALS — Ht 67.0 in | Wt 142.0 lb

## 2019-01-13 DIAGNOSIS — M5481 Occipital neuralgia: Secondary | ICD-10-CM

## 2019-01-13 DIAGNOSIS — G43009 Migraine without aura, not intractable, without status migrainosus: Secondary | ICD-10-CM | POA: Diagnosis not present

## 2019-01-13 NOTE — Progress Notes (Deleted)
Virtual Visit via Video Note The purpose of this virtual visit is to provide medical care while limiting exposure to the novel coronavirus.    Consent was obtained for video visit:  {yes no:[id number]} Answered questions that patient had about telehealth interaction:  {yes no:314532} I discussed the limitations, risks, security and privacy concerns of performing an evaluation and management service by telemedicine. I also discussed with the patient that there may be a patient responsible charge related to this service. The patient expressed understanding and agreed to proceed.  Pt location: Home Physician Location: office Name of referring provider:  Sinda Du, MD I connected with Martha Aguilar at patients initiation/request on 01/13/2019 at  2:30 PM EST by video enabled telemedicine application and verified that I am speaking with the correct person using two identifiers. Pt MRN:  [id number] Pt DOB:  02/08/1970 Video Participants:  Martha Aguilar;  ***   History of Present Illness:  ***  Past Medical History: Past Medical History:  Diagnosis Date  . Headache(784.0)    migraines  . Hypothyroidism   . Migraines   . Occipital neuralgia     Medications: Outpatient Encounter Medications as of 01/13/2019  Medication Sig  . ALPRAZolam (XANAX) 1 MG tablet Take 1 mg by mouth 2 (two) times daily as needed.   Marland Kitchen amitriptyline (ELAVIL) 150 MG tablet TAKE (1) TABLET BY MOUTH AT BEDTIME.  Marland Kitchen amitriptyline (ELAVIL) 50 MG tablet Take 3 tablets (150 mg total) by mouth at bedtime.  . baclofen (LIORESAL) 10 MG tablet Take 10 mg by mouth 3 (three) times daily.   Azucena Freed Serrata (BOSWELLIA PO) Take by mouth daily. 2 TABS DAILY  . butalbital-acetaminophen-caffeine (FIORICET) 50-325-40 MG tablet TAKE (1) TABLET BY MOUTH EVERY SIX HOURS AS NEEDED. MAX 10 TABS PER MONTH.  Marland Kitchen Cholecalciferol (VITAMIN D3) 50 MCG (2000 UT) TABS Take by mouth daily.  . DULoxetine (CYMBALTA) 30 MG capsule Take 2  capsules (60 mg total) by mouth at bedtime.  . gabapentin (NEURONTIN) 300 MG capsule TAKE 2 CAPSULES BY MOUTH 3 TIMES DAILY.  . Galcanezumab-gnlm (EMGALITY) 120 MG/ML SOAJ Inject 120 mg into the skin every 30 (thirty) days.  Marland Kitchen levothyroxine (SYNTHROID, LEVOTHROID) 150 MCG tablet Take 150 mcg by mouth daily before breakfast.  . MAGNESIUM PO Take 2,000 mg by mouth daily.  . Melatonin 12 MG TABS Take by mouth daily.  . Multiple Vitamin (MULTIVITAMIN WITH MINERALS) TABS tablet Take 1 tablet by mouth at bedtime.  . Niacin (VITAMIN B-3 PO) Take 500 mg by mouth daily.  . NON FORMULARY 2,100 mg daily. [facility] [facility]  . Omega-3 Fatty Acids (FISH OIL) 1000 MG CAPS Take by mouth. 2 tabs daily  . ondansetron (ZOFRAN ODT) 4 MG disintegrating tablet Take 1-2 tablets (4 mg total) by mouth every 8 (eight) hours as needed (Patient taking differently: Take 4-8 mg by mouth every 8 (eight) hours as needed for nausea or vomiting. Take 1-2 tablets (4 mg total) by mouth every 8 (eight) hours as needed)  . polyethylene glycol powder (GLYCOLAX/MIRALAX) 17 GM/SCOOP powder Take 17 g by mouth daily.  Marland Kitchen PREMARIN 0.3 MG tablet daily.  . progesterone (PROMETRIUM) 100 MG capsule daily.  . psyllium (METAMUCIL SMOOTH TEXTURE) 58.6 % powder Take 1 packet by mouth daily.  . Rimegepant Sulfate (NURTEC) 75 MG TBDP Take 1 tablet by mouth daily as needed. (Patient taking differently: Take 1 tablet by mouth daily as needed (Migraine). )  . SUMAtriptan (IMITREX) 100 MG tablet Take 100  mg by mouth daily as needed for migraine.   . SUMAtriptan 6 MG/0.5ML SOAJ Inject 6 mg into the skin as needed (Migraine).   Marland Kitchen tiZANidine (ZANAFLEX) 4 MG tablet Take 4 mg by mouth every 6 (six) hours as needed (pain).   . valACYclovir (VALTREX) 1000 MG tablet Take 1 tablet (1,000 mg total) by mouth 2 (two) times daily.  . vitamin C (ASCORBIC ACID) 500 MG tablet Take 500 mg by mouth at bedtime.  . NONFORMULARY OR COMPOUNDED ITEM Sig: Apply 1-2 gm(s) (2-4 pumps)  to affected area, 3-4 times/day. (1 pump = 0.5 gm)  . Ubrogepant (UBRELVY) 100 MG TABS Take 1 tablet by mouth as needed (May repeat dose after 2 hours if needed.  Maximum 2 tablets in 24 hours). (Patient not taking: Reported on 01/07/2019)   No facility-administered encounter medications on file as of 01/13/2019.    Allergies: Allergies  Allergen Reactions  . Aimovig [Erenumab] Other (See Comments)    Burning in the back of the head, severe migraine  . Other Rash    Oxtellar  Oxcarbazepine   . Compazine [Prochlorperazine] Other (See Comments)    IV form, can take PO    Family History: Family History  Problem Relation Age of Onset  . Migraines Mother        benign small bowel tumor  . Prostate cancer Father   . Seizures Brother        MVA  . Migraines Other     Social History: Social History   Socioeconomic History  . Marital status: Divorced    Spouse name: Not on file  . Number of children: 1  . Years of education: 52  . Highest education level: Associate degree: academic program  Occupational History  . Occupation: Nurse  Tobacco Use  . Smoking status: Never Smoker  . Smokeless tobacco: Never Used  Substance and Sexual Activity  . Alcohol use: Yes    Comment: social  . Drug use: No  . Sexual activity: Not on file  Other Topics Concern  . Not on file  Social History Narrative   Lives with daughter   Caffeine use: rare      Patient is right-handed. She lives in a 2 level home with her adopted daughter. 2 cups coffee day.    Social Determinants of Health   Financial Resource Strain:   . Difficulty of Paying Living Expenses: Not on file  Food Insecurity:   . Worried About Charity fundraiser in the Last Year: Not on file  . Ran Out of Food in the Last Year: Not on file  Transportation Needs:   . Lack of Transportation (Medical): Not on file  . Lack of Transportation (Non-Medical): Not on file  Physical Activity:   . Days of Exercise per Week: Not on  file  . Minutes of Exercise per Session: Not on file  Stress:   . Feeling of Stress : Not on file  Social Connections:   . Frequency of Communication with Friends and Family: Not on file  . Frequency of Social Gatherings with Friends and Family: Not on file  . Attends Religious Services: Not on file  . Active Member of Clubs or Organizations: Not on file  . Attends Archivist Meetings: Not on file  . Marital Status: Not on file  Intimate Partner Violence:   . Fear of Current or Ex-Partner: Not on file  . Emotionally Abused: Not on file  . Physically Abused: Not on  file  . Sexually Abused: Not on file    Observations/Objective:   *** No acute distress.  Alert and oriented.  Speech fluent and not dysarthric.  Language intact.  Eyes orthophoric on primary gaze.  Face symmetric.  Assessment and Plan:   ***  Follow Up Instructions:    -I discussed the assessment and treatment plan with the patient. The patient was provided an opportunity to ask questions and all were answered. The patient agreed with the plan and demonstrated an understanding of the instructions.   The patient was advised to call back or seek an in-person evaluation if the symptoms worsen or if the condition fails to improve as anticipated.    Total Time spent in visit with the patient was:  ***, of which more than 50% of the time was spent in counseling and/or coordinating care on ***.   Pt understands and agrees with the plan of care outlined.     Dudley Major, DO

## 2019-01-15 ENCOUNTER — Other Ambulatory Visit: Payer: BC Managed Care – PPO

## 2019-01-16 ENCOUNTER — Ambulatory Visit: Payer: BC Managed Care – PPO | Attending: Internal Medicine

## 2019-01-16 ENCOUNTER — Other Ambulatory Visit: Payer: Self-pay

## 2019-01-16 DIAGNOSIS — Z20822 Contact with and (suspected) exposure to covid-19: Secondary | ICD-10-CM | POA: Diagnosis not present

## 2019-01-17 ENCOUNTER — Other Ambulatory Visit: Payer: BC Managed Care – PPO

## 2019-01-17 LAB — SPECIMEN STATUS REPORT

## 2019-01-17 LAB — NOVEL CORONAVIRUS, NAA: SARS-CoV-2, NAA: DETECTED — AB

## 2019-01-20 ENCOUNTER — Other Ambulatory Visit: Payer: BC Managed Care – PPO

## 2019-01-21 ENCOUNTER — Ambulatory Visit: Payer: BC Managed Care – PPO | Admitting: Student in an Organized Health Care Education/Training Program

## 2019-01-22 NOTE — Progress Notes (Signed)
Patient asked if the Fioricet can be taken over by Dr Holley Raring.  Dr Luan Pulling was prescribing for migraines and is now retired.  She was getting qty 20 per month.  Also, could you prescribe, gabapentin and  Zofran as well.

## 2019-01-23 ENCOUNTER — Other Ambulatory Visit: Payer: Self-pay | Admitting: Neurology

## 2019-01-23 ENCOUNTER — Ambulatory Visit (HOSPITAL_BASED_OUTPATIENT_CLINIC_OR_DEPARTMENT_OTHER): Payer: BC Managed Care – PPO | Admitting: Student in an Organized Health Care Education/Training Program

## 2019-01-23 ENCOUNTER — Other Ambulatory Visit: Payer: Self-pay

## 2019-01-23 DIAGNOSIS — M792 Neuralgia and neuritis, unspecified: Secondary | ICD-10-CM

## 2019-01-23 NOTE — Progress Notes (Signed)
I attempted to call the patient however no response.  -Dr Zain Bingman  

## 2019-01-27 ENCOUNTER — Other Ambulatory Visit: Payer: BC Managed Care – PPO

## 2019-01-27 ENCOUNTER — Encounter: Payer: Self-pay | Admitting: Student in an Organized Health Care Education/Training Program

## 2019-01-27 ENCOUNTER — Telehealth: Payer: Self-pay

## 2019-01-27 DIAGNOSIS — G43709 Chronic migraine without aura, not intractable, without status migrainosus: Secondary | ICD-10-CM

## 2019-01-27 NOTE — Telephone Encounter (Signed)
LM for patient to call office to go over pre virtual appointment questions.  

## 2019-01-28 ENCOUNTER — Encounter: Payer: Self-pay | Admitting: Student in an Organized Health Care Education/Training Program

## 2019-01-28 ENCOUNTER — Other Ambulatory Visit: Payer: Self-pay

## 2019-01-28 ENCOUNTER — Ambulatory Visit
Payer: BC Managed Care – PPO | Attending: Student in an Organized Health Care Education/Training Program | Admitting: Student in an Organized Health Care Education/Training Program

## 2019-01-28 DIAGNOSIS — M792 Neuralgia and neuritis, unspecified: Secondary | ICD-10-CM | POA: Diagnosis not present

## 2019-01-28 DIAGNOSIS — G43809 Other migraine, not intractable, without status migrainosus: Secondary | ICD-10-CM

## 2019-01-28 DIAGNOSIS — G894 Chronic pain syndrome: Secondary | ICD-10-CM

## 2019-01-28 DIAGNOSIS — M5481 Occipital neuralgia: Secondary | ICD-10-CM | POA: Diagnosis not present

## 2019-01-28 MED ORDER — GABAPENTIN 800 MG PO TABS: 800.0000 mg | ORAL_TABLET | Freq: Three times a day (TID) | ORAL | 2 refills | Status: DC

## 2019-01-28 MED ORDER — VYEPTI 100 MG/ML IV SOLN: 100.0000 mg | INTRAVENOUS | 3 refills | Status: DC

## 2019-01-28 NOTE — Progress Notes (Signed)
Patient: IVERY Aguilar  Service Category: E/M  Provider: Gillis Santa, MD  DOB: December 01, 1970  DOS: 01/28/2019  Location: Office  MRN: [id number]  Setting: Ambulatory outpatient  Referring Provider: Sinda Du, MD  Type: Established Patient  Specialty: Interventional Pain Management  PCP: Sinda Du, MD  Location: Home  Delivery: TeleHealth     Virtual Encounter - Pain Management PROVIDER NOTE: Information contained herein reflects review and annotations entered in association with encounter. Interpretation of such information and data should be left to medically-trained personnel. Information provided to patient can be located elsewhere in the medical record under "Patient Instructions". Document created using STT-dictation technology, any transcriptional errors that may result from process are unintentional.    Contact & Pharmacy Preferred: [phone] Home: [phone] (home) Mobile: [phone] (mobile) E-mail: [email or web address]  [place], [place] [address] Phone: [phone] Fax: [phone]  [facility] [place], [place] [id number] [place] [address] Phone: [phone] Fax: [phone]   Pre-screening  [name] offered "in-person" vs "virtual" encounter. She indicated preferring virtual for this encounter.   Reason COVID-19*  Social distancing based on CDC and AMA recommendations.   I contacted Najayah Kaminer Hartline on 01/28/2019 via telephone.      I clearly identified myself as Gillis Santa, MD. I verified that I was speaking with the correct person using two identifiers (Name: [name], and date of birth: 09/28/1970).  This visit was completed via telephone due to the restrictions of the COVID-19 pandemic. All issues as above were discussed and addressed but no physical exam was performed. If it was felt that the patient  should be evaluated in the office, they were directed there. The patient verbally consented to this visit. Patient was unable to complete an audio/visual visit due to Technical difficulties and/or Lack of internet. Due to the catastrophic nature of the COVID-19 pandemic, this visit was done through audio contact only.  Location of the patient: home address (see Epic for details)  Location of the provider: office Consent I sought verbal advanced consent from [place] for virtual visit interactions. I informed Ms. Hao of possible security and privacy concerns, risks, and limitations associated with providing "not-in-person" medical evaluation and management services. I also informed Ms. Stlouis of the availability of "in-person" appointments. Finally, I informed her that there would be a charge for the virtual visit and that she could be  personally, fully or partially, financially responsible for it. Ms. Lacroix expressed understanding and agreed to proceed.   Historic Elements   Martha Aguilar is a 49 y.o. year old, female patient evaluated today after her last encounter by our practice on 01/27/2019. Martha Aguilar  has a past medical history of Headache(784.0), Hypothyroidism, Migraines, and Occipital neuralgia. She also  has a past surgical history that includes uterine polyps; Tubal ligation; uterine ablation; Esophagogastroduodenoscopy (egd) with propofol (N/A, 06/13/2018); Colonoscopy (N/A, 10/23/2018); polypectomy (10/23/2018); and biopsy (10/23/2018). Ms. Czechowski has a current medication list which includes the following prescription(s): alpha-lipoic acid, alprazolam, amitriptyline, baclofen, boswellia serrata, butalbital-acetaminophen-caffeine, vitamin d3, duloxetine, emgality, hydrocodone-acetaminophen, levothyroxine, magnesium, melatonin, multivitamin with minerals, niacin, NON FORMULARY, NONFORMULARY OR COMPOUNDED ITEM, nurtec, fish oil, polyethylene glycol powder, premarin, progesterone,  sumatriptan, sumatriptan, tizanidine, valacyclovir, vitamin c, gabapentin, ondansetron, metamucil smooth texture, and ubrelvy. She  reports that she has never smoked. She has never used smokeless tobacco. She reports current alcohol use.  She reports that she does not use drugs. Ms. Pow is allergic to aimovig [erenumab]; other; and compazine [prochlorperazine].   HPI  Today, she is being contacted for medication management.  Unfortunately, the patient was symptomatic from COVID-19.  She had body aches and chills, she is on day 11 and states that she is recovering.  Her daughter was also diagnosed positive.  They are recovering from this.  Patient is requesting that I take over her gabapentin.  She is currently taking 600 mg twice a day and 800 mg nightly.  She is requesting a dose increase.  We can increase to 800 mg 3 times a day but I encouraged her that we should stay at this dose as you do not usually see better analgesic benefits beyond 24 to 2600 mg of gabapentin due to its duodenal absorption.  Patient is requesting a prescription for Vicodin which I politely declined.  I informed the patient that she is on Xanax that she takes chronically and that we do not could prescribe any sort of opioid analgesics for patients that are on concomitant benzodiazepine therapy.  I encouraged the patient to focus on nonopioid analgesics and interventional pain therapies to help manage her occipital neuralgia.  We have also discussed a lidocaine infusion in the past.  Patient was unable to get her pretreatment EKG.  We will reorder today and so long as QRS interval is unremarkable, can consider lidocaine infusion for neuropathic pain.   Laboratory Chemistry Profile (12 mo)  Renal: 06/04/2018: BUN 18; Creatinine, Ser 0.78  Lab Results  Component Value Date   GFRAA >60 06/04/2018   GFRNONAA >60 06/04/2018   Hepatic: 06/04/2018: Albumin 4.1 Lab Results  Component Value Date   AST 30 06/04/2018   ALT 44  06/04/2018   Other: No results found for requested labs within last 8760 hours. Note: Above Lab results reviewed.  Imaging  NM Hepato W/EjeCT Fract CLINICAL DATA:  Right upper quadrant pain  EXAM: NUCLEAR MEDICINE HEPATOBILIARY IMAGING WITH GALLBLADDER EF  VIEWS: Anterior right upper quadrant  RADIOPHARMACEUTICALS:  5.11 mCi Tc-44m  Choletec IV  COMPARISON:  None.  FINDINGS: Liver uptake of radiotracer is unremarkable. There is prompt visualization of gallbladder and small bowel, indicating patency of the cystic and common bile ducts. The patient consumed 8 ounces of Ensure orally with calculation of the computer generated ejection fraction of radiotracer from the gallbladder. The patient did report abdominal discomfort with the oral Ensure consumption. The computer generated ejection fraction of radiotracer from the gallbladder is normal at 99%, normal greater than 33% using the oral agent.  IMPRESSION: Normal ejection fraction of radiotracer from the gallbladder. The patient did experience abdominal discomfort with the oral Ensure consumption. Cystic and common bile ducts are patent as is evidenced by visualization of gallbladder and small bowel.  Electronically Signed   By: Lowella Grip III M.D.   On: 05/31/2018 15:19   Assessment  The primary encounter diagnosis was Neuropathic pain. Diagnoses of Bilateral occipital neuralgia, Other migraine without status migrainosus, not intractable, and Chronic pain syndrome were also pertinent to this visit.  Plan of Care  I have discontinued Cloe M. Ryther's gabapentin and gabapentin. I have also changed her gabapentin. Additionally, I am having her maintain her vitamin C, multivitamin with minerals, ondansetron, levothyroxine, ALPRAZolam, valACYclovir, Ubrelvy, baclofen, Premarin, progesterone, SUMAtriptan, tiZANidine, Emgality, SUMAtriptan, polyethylene glycol powder, Metamucil Smooth Texture, DULoxetine,  butalbital-acetaminophen-caffeine, amitriptyline, Boswellia Serrata (BOSWELLIA PO), Vitamin D3, MAGNESIUM PO, Melatonin, Fish Oil, Niacin (VITAMIN B-3  PO), NON FORMULARY, NONFORMULARY OR COMPOUNDED ITEM, Alpha-Lipoic Acid, HYDROcodone-acetaminophen, and Nurtec.   Increase Gabapentin to 800 mg TID (from 600 BID, 800 mg qhs) EKG for QTC assessment- can consider Lidocaine infusion Consider occipital nerve blocks in future  Pharmacotherapy (Medications Ordered): Meds ordered this encounter  Medications  . gabapentin (NEURONTIN) 800 MG tablet    Sig: Take 1 tablet (800 mg total) by mouth 3 (three) times daily.    Dispense:  90 tablet    Refill:  2   Orders:  Orders Placed This Encounter  Procedures  . EKG 12-Lead    This ECG is being ordered as part of a work-up prior to high-risk medication prescribing.    Scheduling Instructions:     Please evaluate for QT/QTc prolongation, as well as 2nd degree AV blocks prior to high-risk medication therapy.   Follow-up plan:   No follow-ups on file.     Recent Visits Date Type Provider Dept  11/28/18 Office Visit Gillis Santa, MD [facility]  Showing recent visits within past 90 days and meeting all other requirements   Today's Visits Date Type Provider Dept  01/28/19 Office Visit Gillis Santa, MD [facility]  Showing today's visits and meeting all other requirements   Future Appointments No visits were found meeting these conditions.  Showing future appointments within next 90 days and meeting all other requirements   I discussed the assessment and treatment plan with the patient. The patient was provided an opportunity to ask questions and all were answered. The patient agreed with the plan and demonstrated an understanding of the instructions.  Patient advised to call back or seek an in-person evaluation if the symptoms or condition worsens.  Duration of encounter: 57minutes.  Note by: Gillis Santa, MD Date:  01/28/2019; Time: 3:39 PM

## 2019-01-29 ENCOUNTER — Telehealth: Payer: Self-pay | Admitting: Neurology

## 2019-01-29 NOTE — Telephone Encounter (Signed)
[facility] in [facility] called and they are needing to let Dr. Tomi Likens know that there was prescription faxed to them in error. She said that they are a Meds to [employer]. The medication that was faxed was for Vyepti injection. She said you may need to re send the fax to the other pharmacy. Thank you

## 2019-01-30 ENCOUNTER — Telehealth: Payer: Self-pay

## 2019-01-30 DIAGNOSIS — Z01419 Encounter for gynecological examination (general) (routine) without abnormal findings: Secondary | ICD-10-CM | POA: Diagnosis not present

## 2019-01-30 DIAGNOSIS — N959 Unspecified menopausal and perimenopausal disorder: Secondary | ICD-10-CM | POA: Diagnosis not present

## 2019-01-30 DIAGNOSIS — Z6824 Body mass index (BMI) 24.0-24.9, adult: Secondary | ICD-10-CM | POA: Diagnosis not present

## 2019-01-30 DIAGNOSIS — E039 Hypothyroidism, unspecified: Secondary | ICD-10-CM | POA: Diagnosis not present

## 2019-01-30 DIAGNOSIS — Z1231 Encounter for screening mammogram for malignant neoplasm of breast: Secondary | ICD-10-CM | POA: Diagnosis not present

## 2019-01-30 NOTE — Telephone Encounter (Signed)
Per Florentina Jenny at [facility], she is unsure about MCD, but for this particular patient, since she has [employer]BS, she must try aimovig and Emgality for the Vpeti to be approved , I do see she has tired Emaglity and avjoy. Please advise.

## 2019-01-30 NOTE — Telephone Encounter (Signed)
Trying to authorization for Vyepti. Clinicals sent

## 2019-01-30 NOTE — Telephone Encounter (Signed)
She has previously tried Aimovig as well.

## 2019-02-03 ENCOUNTER — Encounter: Payer: Self-pay | Admitting: Student in an Organized Health Care Education/Training Program

## 2019-02-04 DIAGNOSIS — D0359 Melanoma in situ of other part of trunk: Secondary | ICD-10-CM | POA: Diagnosis not present

## 2019-02-04 DIAGNOSIS — L988 Other specified disorders of the skin and subcutaneous tissue: Secondary | ICD-10-CM | POA: Diagnosis not present

## 2019-02-04 DIAGNOSIS — D485 Neoplasm of uncertain behavior of skin: Secondary | ICD-10-CM | POA: Diagnosis not present

## 2019-02-05 ENCOUNTER — Telehealth: Payer: Self-pay

## 2019-02-05 ENCOUNTER — Other Ambulatory Visit: Payer: Self-pay

## 2019-02-05 DIAGNOSIS — R519 Headache, unspecified: Secondary | ICD-10-CM

## 2019-02-05 DIAGNOSIS — G43709 Chronic migraine without aura, not intractable, without status migrainosus: Secondary | ICD-10-CM

## 2019-02-05 NOTE — Telephone Encounter (Signed)
Referral sent to [facility]

## 2019-02-05 NOTE — Telephone Encounter (Signed)
We can appeal and if still denied, then we will have to let her know that her insurance is not approving it.

## 2019-02-05 NOTE — Telephone Encounter (Signed)
If they have a surgeon that performs this, then okay

## 2019-02-05 NOTE — Telephone Encounter (Signed)
Patient wants referral to [facility] for consult for headache and stimulator possible.please advise

## 2019-02-05 NOTE — Telephone Encounter (Signed)
I sent request for prior authorization for vyepti denied, can appeal . Reference number [id number], insurance denied. [phone]. Clinicals were sent and notes. States her health plan does not pay for investigational services. Must try another route or appeal. Appeals dept. Po [place], [place]

## 2019-02-05 NOTE — Telephone Encounter (Signed)
[facility] will not see this patient due to mutliple no shows and cancellations, please readvise,.

## 2019-02-05 NOTE — Telephone Encounter (Signed)
Let patient know.

## 2019-02-06 NOTE — Telephone Encounter (Signed)
Patient called and I asked her to contact her insurance to see who participates with her insurance and go from there with the referral.

## 2019-02-07 ENCOUNTER — Encounter: Payer: Self-pay | Admitting: Student in an Organized Health Care Education/Training Program

## 2019-02-13 ENCOUNTER — Other Ambulatory Visit: Payer: Self-pay | Admitting: Student in an Organized Health Care Education/Training Program

## 2019-02-13 DIAGNOSIS — G894 Chronic pain syndrome: Secondary | ICD-10-CM

## 2019-02-14 ENCOUNTER — Ambulatory Visit: Payer: BC Managed Care – PPO

## 2019-02-17 ENCOUNTER — Ambulatory Visit: Payer: BC Managed Care – PPO | Attending: Pulmonary Disease

## 2019-02-18 HISTORY — PX: INSERTION / PLACEMENT / REVISION NEUROSTIMULATOR: SUR720

## 2019-02-21 ENCOUNTER — Other Ambulatory Visit: Payer: Self-pay | Admitting: Internal Medicine

## 2019-02-21 ENCOUNTER — Ambulatory Visit: Payer: BC Managed Care – PPO

## 2019-02-26 ENCOUNTER — Ambulatory Visit: Payer: BC Managed Care – PPO

## 2019-02-28 ENCOUNTER — Other Ambulatory Visit: Payer: Self-pay | Admitting: Neurology

## 2019-03-03 ENCOUNTER — Ambulatory Visit
Admission: RE | Admit: 2019-03-03 | Discharge: 2019-03-03 | Disposition: A | Discharge status: Home / Self Care | Payer: BC Managed Care – PPO | Source: Ambulatory Visit | Attending: Student in an Organized Health Care Education/Training Program | Admitting: Student in an Organized Health Care Education/Training Program

## 2019-03-03 ENCOUNTER — Other Ambulatory Visit: Payer: Self-pay

## 2019-03-03 ENCOUNTER — Other Ambulatory Visit: Payer: Self-pay | Admitting: Neurology

## 2019-03-03 DIAGNOSIS — G894 Chronic pain syndrome: Secondary | ICD-10-CM | POA: Diagnosis not present

## 2019-03-03 MED ORDER — TIZANIDINE HCL 4 MG PO TABS: 4.0000 mg | ORAL_TABLET | Freq: Four times a day (QID) | ORAL | 3 refills | Status: DC | PRN

## 2019-03-04 ENCOUNTER — Other Ambulatory Visit: Payer: Self-pay | Admitting: Neurology

## 2019-03-04 DIAGNOSIS — Z0279 Encounter for issue of other medical certificate: Secondary | ICD-10-CM

## 2019-03-04 MED ORDER — TIZANIDINE HCL 4 MG PO TABS: 4.0000 mg | ORAL_TABLET | Freq: Four times a day (QID) | ORAL | 3 refills | Status: DC | PRN

## 2019-03-09 ENCOUNTER — Encounter: Payer: Self-pay | Admitting: Student in an Organized Health Care Education/Training Program

## 2019-03-09 DIAGNOSIS — M792 Neuralgia and neuritis, unspecified: Secondary | ICD-10-CM

## 2019-03-13 DIAGNOSIS — E039 Hypothyroidism, unspecified: Secondary | ICD-10-CM | POA: Diagnosis not present

## 2019-03-13 DIAGNOSIS — Z6824 Body mass index (BMI) 24.0-24.9, adult: Secondary | ICD-10-CM | POA: Diagnosis not present

## 2019-03-13 DIAGNOSIS — Z713 Dietary counseling and surveillance: Secondary | ICD-10-CM | POA: Diagnosis not present

## 2019-03-17 ENCOUNTER — Ambulatory Visit: Payer: BC Managed Care – PPO | Admitting: Student in an Organized Health Care Education/Training Program

## 2019-03-21 DIAGNOSIS — R300 Dysuria: Secondary | ICD-10-CM | POA: Diagnosis not present

## 2019-03-21 DIAGNOSIS — Z6822 Body mass index (BMI) 22.0-22.9, adult: Secondary | ICD-10-CM | POA: Diagnosis not present

## 2019-03-24 ENCOUNTER — Ambulatory Visit: Payer: BC Managed Care – PPO | Admitting: Student in an Organized Health Care Education/Training Program

## 2019-03-24 DIAGNOSIS — R300 Dysuria: Secondary | ICD-10-CM | POA: Diagnosis not present

## 2019-03-24 DIAGNOSIS — R319 Hematuria, unspecified: Secondary | ICD-10-CM | POA: Diagnosis not present

## 2019-03-24 DIAGNOSIS — R110 Nausea: Secondary | ICD-10-CM | POA: Diagnosis not present

## 2019-03-24 DIAGNOSIS — N390 Urinary tract infection, site not specified: Secondary | ICD-10-CM | POA: Diagnosis not present

## 2019-03-25 ENCOUNTER — Other Ambulatory Visit: Payer: Self-pay

## 2019-03-25 ENCOUNTER — Emergency Department (HOSPITAL_COMMUNITY)
Admission: EM | Admit: 2019-03-25 | Discharge: 2019-03-25 | Disposition: A | Discharge status: Home / Self Care | Payer: BC Managed Care – PPO | Attending: Emergency Medicine | Admitting: Emergency Medicine

## 2019-03-25 ENCOUNTER — Encounter (HOSPITAL_COMMUNITY): Payer: Self-pay | Admitting: *Deleted

## 2019-03-25 DIAGNOSIS — R350 Frequency of micturition: Secondary | ICD-10-CM | POA: Diagnosis not present

## 2019-03-25 DIAGNOSIS — N390 Urinary tract infection, site not specified: Secondary | ICD-10-CM

## 2019-03-25 DIAGNOSIS — E039 Hypothyroidism, unspecified: Secondary | ICD-10-CM | POA: Diagnosis not present

## 2019-03-25 DIAGNOSIS — D225 Melanocytic nevi of trunk: Secondary | ICD-10-CM | POA: Diagnosis not present

## 2019-03-25 DIAGNOSIS — Z08 Encounter for follow-up examination after completed treatment for malignant neoplasm: Secondary | ICD-10-CM | POA: Diagnosis not present

## 2019-03-25 DIAGNOSIS — D485 Neoplasm of uncertain behavior of skin: Secondary | ICD-10-CM | POA: Diagnosis not present

## 2019-03-25 DIAGNOSIS — R1030 Lower abdominal pain, unspecified: Secondary | ICD-10-CM | POA: Insufficient documentation

## 2019-03-25 DIAGNOSIS — Z79899 Other long term (current) drug therapy: Secondary | ICD-10-CM | POA: Diagnosis not present

## 2019-03-25 DIAGNOSIS — Z1283 Encounter for screening for malignant neoplasm of skin: Secondary | ICD-10-CM | POA: Diagnosis not present

## 2019-03-25 DIAGNOSIS — R300 Dysuria: Secondary | ICD-10-CM | POA: Diagnosis not present

## 2019-03-25 DIAGNOSIS — Z85820 Personal history of malignant melanoma of skin: Secondary | ICD-10-CM | POA: Diagnosis not present

## 2019-03-25 LAB — COMPREHENSIVE METABOLIC PANEL
ALT: 19 U/L (ref 0–44)
AST: 17 U/L (ref 15–41)
Albumin: 4.7 g/dL (ref 3.5–5.0)
Alkaline Phosphatase: 80 U/L (ref 38–126)
Anion gap: 8 (ref 5–15)
BUN: 38 mg/dL — ABNORMAL HIGH (ref 6–20)
CO2: 28 mmol/L (ref 22–32)
Calcium: 9.2 mg/dL (ref 8.9–10.3)
Chloride: 101 mmol/L (ref 98–111)
Creatinine, Ser: 0.9 mg/dL (ref 0.44–1.00)
GFR calc Af Amer: >60 mL/min (ref >60)
GFR calc non Af Amer: >60 mL/min (ref >60)
Glucose, Bld: 97 mg/dL (ref 70–99)
Potassium: 4.3 mmol/L (ref 3.5–5.1)
Sodium: 137 mmol/L (ref 135–145)
Total Bilirubin: 0.5 mg/dL (ref 0.3–1.2)
Total Protein: 7.2 g/dL (ref 6.5–8.1)

## 2019-03-25 LAB — URINALYSIS, ROUTINE W REFLEX MICROSCOPIC
Bacteria, UA: NONE SEEN
Bilirubin Urine: NEGATIVE
Glucose, UA: NEGATIVE mg/dL
Hgb urine dipstick: NEGATIVE
Ketones, ur: NEGATIVE mg/dL
Leukocytes,Ua: NEGATIVE
Nitrite: POSITIVE — AB
Protein, ur: NEGATIVE mg/dL
Specific Gravity, Urine: 1.014 (ref 1.005–1.030)
pH: 7 (ref 5.0–8.0)

## 2019-03-25 LAB — CBC WITH DIFFERENTIAL/PLATELET
Abs Immature Granulocytes: 0.01 10*3/uL (ref 0.00–0.07)
Basophils Absolute: 0.1 10*3/uL (ref 0.0–0.1)
Basophils Relative: 1 %
Eosinophils Absolute: 0 10*3/uL (ref 0.0–0.5)
Eosinophils Relative: 1 %
HCT: 48.2 % — ABNORMAL HIGH (ref 36.0–46.0)
Hemoglobin: 15.7 g/dL — ABNORMAL HIGH (ref 12.0–15.0)
Immature Granulocytes: 0 %
Lymphocytes Relative: 26 %
Lymphs Abs: 1.8 10*3/uL (ref 0.7–4.0)
MCH: 33.3 pg (ref 26.0–34.0)
MCHC: 32.6 g/dL (ref 30.0–36.0)
MCV: 102.3 fL — ABNORMAL HIGH (ref 80.0–100.0)
Monocytes Absolute: 0.5 10*3/uL (ref 0.1–1.0)
Monocytes Relative: 7 %
Neutro Abs: 4.5 10*3/uL (ref 1.7–7.7)
Neutrophils Relative %: 65 %
Platelets: 262 10*3/uL (ref 150–400)
RBC: 4.71 MIL/uL (ref 3.87–5.11)
RDW: 11.5 % (ref 11.5–15.5)
WBC: 6.9 10*3/uL (ref 4.0–10.5)
nRBC: 0 % (ref 0.0–0.2)

## 2019-03-25 MED ORDER — SODIUM CHLORIDE 0.9 % IV SOLN
1.0000 g | Freq: Once | INTRAVENOUS | Status: AC
  Administered 2019-03-25: 1 g via INTRAVENOUS
  Filled 2019-03-25: qty 10

## 2019-03-25 NOTE — ED Provider Notes (Signed)
[facility] EMERGENCY DEPARTMENT Provider Note   CSN: [id number] Arrival date & time: 03/25/19  1725     History Chief Complaint  Patient presents with  . Dysuria    Martha Aguilar is a 49 y.o. female who presents with dysuria.  Patient states that she started to have urinary symptoms about a week ago.  She has had dysuria, suprapubic pain, frequency.  She took some leftover doxy that she had from a previous prescription which did not help.  She went to urgent care 4 days ago and had a UA and culture sent off.  Urine appeared consistent with UTI.  She was started on Bactrim and given a prescription for Azo.  She took the full course of this however symptoms did not improve.  She went back to urgent care yesterday and was given a shot shot of Rocephin and started on Keflex.  She is also given Zofran for nausea.  Today she continued to have urinary symptoms and felt like the bilateral flank was hurting.  She is a former [employer] scientist and was concerned she might be developing a kidney infection so she came back to the ED.  She states that on Sunday she is flying out to [place] to have an experimental procedure for her occipital neuralgia and wants to have symptoms resolved by then.  Urine culture from urgent care has not resulted yet. She denies fever, chills, vomiting.  HPI   Past Medical History:  Diagnosis Date  . Headache(784.0)    migraines  . Hypothyroidism   . Migraines   . Occipital neuralgia     Patient Active Problem List   Diagnosis Date Noted  . Bilateral occipital neuralgia 11/28/2018  . Neuropathic pain 11/28/2018  . Constipation 09/05/2018  . Abdominal bloating 09/05/2018  . Right upper quadrant pain 06/06/2018  . Chronic migraine w/o aura w/o status migrainosus, not intractable 08/22/2015  . Abdominal pain, epigastric 07/03/2013  . Acute kidney injury ([place]) 10/20/2011  . Nausea & vomiting 10/19/2011  . Suprapubic abdominal pain 10/19/2011  . Dehydration 10/19/2011  .  Hypothyroidism 10/19/2011  . Migraine 10/19/2011    Past Surgical History:  Procedure Laterality Date  . BIOPSY  10/23/2018   Procedure: BIOPSY;  Surgeon: Rogene Houston, MD;  [facility]: [facility];  Service: Endoscopy;;  . COLONOSCOPY N/A 10/23/2018   Procedure: COLONOSCOPY;  Surgeon: Rogene Houston, MD;  Location: [facility];  Service: Endoscopy;  Laterality: N/A;  1:00  . ESOPHAGOGASTRODUODENOSCOPY (EGD) WITH PROPOFOL N/A 06/13/2018   Procedure: ESOPHAGOGASTRODUODENOSCOPY (EGD) WITH PROPOFOL;  Surgeon: Rogene Houston, MD;  Location: [facility];  Service: Endoscopy;  Laterality: N/A;  . POLYPECTOMY  10/23/2018   Procedure: POLYPECTOMY;  Surgeon: Rogene Houston, MD;  Location: [facility];  Service: Endoscopy;;  . TUBAL LIGATION    . uterine ablation    . uterine polyps       OB History    Gravida      Para      Term      Preterm      AB      Living  0     SAB      TAB      Ectopic      Multiple      Live Births              Family History  Problem Relation Age of Onset  . Migraines Mother  benign small bowel tumor  . Prostate cancer Father   . Seizures Brother        MVA  . Migraines Other     Social History   Tobacco Use  . Smoking status: Never Smoker  . Smokeless tobacco: Never Used  Substance Use Topics  . Alcohol use: Yes    Comment: social  . Drug use: No    Home Medications Prior to Admission medications   Medication Sig Start Date End Date Taking? Authorizing Provider  Alpha-Lipoic Acid 300 MG CAPS Take 300 mg by mouth 3 (three) times daily.    [provider]  ALPRAZolam Duanne Moron) 1 MG tablet Take 1 mg by mouth 2 (two) times daily as needed.  05/01/16   [provider]  amitriptyline (ELAVIL) 150 MG tablet TAKE (1) TABLET BY MOUTH AT BEDTIME. 11/28/18   Tomi Likens, Adam R, DO  baclofen (LIORESAL) 10 MG tablet Take 10 mg by mouth 3 (three) times daily.  08/09/18   [provider]  Boswellia  Serrata (BOSWELLIA PO) Take by mouth daily. 2 TABS DAILY    [provider]  butalbital-acetaminophen-caffeine (FIORICET) 50-325-40 MG tablet TAKE (1) TABLET BY MOUTH EVERY SIX HOURS AS NEEDED. MAX 10 TABS PER MONTH. 02/28/19   Tomi Likens, Adam R, DO  Cholecalciferol (VITAMIN D3) 50 MCG (2000 UT) TABS Take by mouth daily.    [provider]  DULoxetine (CYMBALTA) 30 MG capsule Take 2 capsules (60 mg total) by mouth at bedtime. 11/13/18   Tomi Likens, Adam R, DO  Eptinezumab-jjmr (VYEPTI) 100 MG/ML injection Inject 1 mL (100 mg total) into the vein every 3 (three) months. 01/28/19   Tomi Likens, Adam R, DO  gabapentin (NEURONTIN) 800 MG tablet Take 1 tablet (800 mg total) by mouth 3 (three) times daily. 01/28/19   Gillis Santa, MD  Galcanezumab-gnlm (EMGALITY) 120 MG/ML SOAJ Inject 120 mg into the skin every 30 (thirty) days. 09/06/18   Pieter Partridge, DO  HYDROcodone-acetaminophen (NORCO/VICODIN) 5-325 MG tablet Take 1 tablet by mouth every 6 (six) hours as needed for moderate pain.    [provider]  levothyroxine (SYNTHROID, LEVOTHROID) 150 MCG tablet Take 150 mcg by mouth daily before breakfast.    [provider]  MAGNESIUM PO Take 2,000 mg by mouth daily.    [provider]  Melatonin 12 MG TABS Take by mouth daily.    [provider]  Multiple Vitamin (MULTIVITAMIN WITH MINERALS) TABS tablet Take 1 tablet by mouth at bedtime.    [provider]  Niacin (VITAMIN B-3 PO) Take 500 mg by mouth daily.    [provider]  NON FORMULARY 2,100 mg daily. [facility]    [provider]  NURTEC 75 MG TBDP TAKE 1 TABLET DAILY IF NEEDED. MAX 1 TABLET PER DAY. 02/28/19   Jaffe, Adam R, DO  Omega-3 Fatty Acids (FISH OIL) 1000 MG CAPS Take by mouth. 2 tabs daily    [provider]  ondansetron (ZOFRAN ODT) 4 MG disintegrating tablet Take 1-2 tablets (4 mg total) by mouth every 8 (eight) hours as needed Patient not taking: Reported on  01/22/2019 08/18/15   Melvenia Beam, MD  polyethylene glycol powder (GLYCOLAX/MIRALAX) 17 GM/SCOOP powder Take 17 g by mouth daily. 10/23/18   Rehman, Mechele Dawley, MD  PREMARIN 0.3 MG tablet daily. 08/24/18   [provider]  progesterone (PROMETRIUM) 100 MG capsule daily. 08/24/18   [provider]  psyllium (METAMUCIL SMOOTH TEXTURE) 58.6 % powder Take  1 packet by mouth daily. Patient not taking: Reported on 01/22/2019 10/23/18   Rogene Houston, MD  SUMAtriptan (IMITREX) 100 MG tablet Take 100 mg by mouth daily as needed for migraine.  10/05/18   [provider]  SUMAtriptan 6 MG/0.5ML SOAJ Inject 6 mg into the skin as needed (Migraine).  07/19/18   [provider]  tiZANidine (ZANAFLEX) 4 MG tablet Take 1 tablet (4 mg total) by mouth every 6 (six) hours as needed (pain). 03/04/19   Tomi Likens, Adam R, DO  Ubrogepant (UBRELVY) 100 MG TABS Take 1 tablet by mouth as needed (May repeat dose after 2 hours if needed.  Maximum 2 tablets in 24 hours). Patient not taking: Reported on 01/07/2019 07/01/18   Pieter Partridge, DO  valACYclovir (VALTREX) 1000 MG tablet Take 1 tablet (1,000 mg total) by mouth 2 (two) times daily. 05/09/17   Cresenzo-Dishmon, Joaquim Lai, CNM  vitamin C (ASCORBIC ACID) 500 MG tablet Take 500 mg by mouth at bedtime.    [provider]    Allergies    Aimovig [erenumab], Other, and Compazine [prochlorperazine]  Review of Systems   Review of Systems  Constitutional: Negative for chills and fever.  Respiratory: Negative for shortness of breath.   Cardiovascular: Negative for chest pain.  Gastrointestinal: Positive for abdominal pain and nausea. Negative for vomiting.  Genitourinary: Positive for dysuria, flank pain and frequency.  All other systems reviewed and are negative.   Physical Exam Updated Vital Signs BP 131/90 (BP Location: Left Arm)   Pulse (!) 103   Temp 98.1 F (36.7 C) (Oral)   Resp 20   Ht 5\' 7"  (1.702 m)   Wt 67.1 kg   SpO2  95%   BMI 23.18 kg/m   Physical Exam Vitals and nursing note reviewed.  Constitutional:      General: She is not in acute distress.    Appearance: Normal appearance. She is well-developed. She is not ill-appearing.     Comments: Calm and cooperative. Well appearing  HENT:     Head: Normocephalic and atraumatic.  Eyes:     General: No scleral icterus.       Right eye: No discharge.        Left eye: No discharge.     Conjunctiva/sclera: Conjunctivae normal.     Pupils: Pupils are equal, round, and reactive to light.  Cardiovascular:     Rate and Rhythm: Normal rate and regular rhythm.  Pulmonary:     Effort: Pulmonary effort is normal. No respiratory distress.     Breath sounds: Normal breath sounds.  Abdominal:     General: There is no distension.     Palpations: Abdomen is soft.     Tenderness: There is abdominal tenderness (suprapubic tenderness). There is right CVA tenderness and left CVA tenderness.  Musculoskeletal:     Cervical back: Normal range of motion.  Skin:    General: Skin is warm and dry.  Neurological:     Mental Status: She is alert and oriented to person, place, and time.  Psychiatric:        Behavior: Behavior normal.     ED Results / Procedures / Treatments   Labs (all labs ordered are listed, but only abnormal results are displayed) Labs Reviewed  URINALYSIS, ROUTINE W REFLEX MICROSCOPIC - Abnormal; Notable for the following components:      Result Value   Color, Urine AMBER (*)    Nitrite POSITIVE (*)    All other components within  normal limits  CBC WITH DIFFERENTIAL/PLATELET - Abnormal; Notable for the following components:   Hemoglobin 15.7 (*)    HCT 48.2 (*)    MCV 102.3 (*)    All other components within normal limits  COMPREHENSIVE METABOLIC PANEL - Abnormal; Notable for the following components:   BUN 38 (*)    All other components within normal limits    EKG None  Radiology No results found.  Procedures Procedures  (including critical care time)  Medications Ordered in ED Medications  cefTRIAXone (ROCEPHIN) 1 g in sodium chloride 0.9 % 100 mL IVPB (1 g Intravenous New Bag/Given 03/25/19 2027)    ED Course  I have reviewed the triage vital signs and the nursing notes.  Pertinent labs & imaging results that were available during my care of the patient were reviewed by me and considered in my medical decision making (see chart for details).  49 year old female presents with persistent UTI for 1 week.  She has been on Bactrim finished a course of this, had IM Rocephin, and had 1 day of Keflex and states that symptoms not improving very quickly although she does state that she felt somewhat better today.  She is concerned because she is taking a trip out of state later this week wants to make sure UTI is resolving and she is not developing pyelonephritis.  Heart rate is mildly elevated but otherwise vital signs are normal.  She is overall well-appearing.  She has mild suprapubic and CVA tenderness.  Will obtain CBC, CMP, UA.  Will give 1 g of Rocephin.  CBC shows mildly elevated hemoglobin.  CMP shows mildly elevated BUN.  This could be from mild dehydration.  UA appears overall improving still has positive nitrates.  Discussed with patient.  Advised to continue Keflex and wait for culture report.  She is advised to return if worsening.  MDM Rules/Calculators/A&P                       Final Clinical Impression(s) / ED Diagnoses Final diagnoses:  Lower urinary tract infectious disease    Rx / DC Orders ED Discharge Orders    None       Recardo Evangelist, PA-C 03/25/19 2101    Lucrezia Starch, MD 03/25/19 2357

## 2019-03-25 NOTE — ED Triage Notes (Signed)
C/o recurrent UTI, states she has been on bactrim and doxycycline, states she has also had a shot of Rocephin , states a culture was done but has no resulted yet

## 2019-03-25 NOTE — Discharge Instructions (Signed)
Please drink plenty of fluids Finish Keflex Return if you are worsening

## 2019-03-29 ENCOUNTER — Other Ambulatory Visit: Payer: Self-pay | Admitting: Neurology

## 2019-03-31 DIAGNOSIS — G43009 Migraine without aura, not intractable, without status migrainosus: Secondary | ICD-10-CM | POA: Diagnosis not present

## 2019-03-31 DIAGNOSIS — M542 Cervicalgia: Secondary | ICD-10-CM | POA: Diagnosis not present

## 2019-03-31 DIAGNOSIS — M5412 Radiculopathy, cervical region: Secondary | ICD-10-CM | POA: Diagnosis not present

## 2019-03-31 DIAGNOSIS — G894 Chronic pain syndrome: Secondary | ICD-10-CM | POA: Diagnosis not present

## 2019-03-31 DIAGNOSIS — G9059 Complex regional pain syndrome I of other specified site: Secondary | ICD-10-CM | POA: Diagnosis not present

## 2019-03-31 DIAGNOSIS — M792 Neuralgia and neuritis, unspecified: Secondary | ICD-10-CM | POA: Diagnosis not present

## 2019-03-31 DIAGNOSIS — G9050 Complex regional pain syndrome I, unspecified: Secondary | ICD-10-CM | POA: Diagnosis not present

## 2019-04-07 ENCOUNTER — Other Ambulatory Visit: Payer: Self-pay

## 2019-04-07 ENCOUNTER — Ambulatory Visit
Payer: BC Managed Care – PPO | Attending: Student in an Organized Health Care Education/Training Program | Admitting: Student in an Organized Health Care Education/Training Program

## 2019-04-07 ENCOUNTER — Encounter: Payer: Self-pay | Admitting: Student in an Organized Health Care Education/Training Program

## 2019-04-07 VITALS — BP 135/96 | HR 93 | Temp 97.8°F | Resp 18 | Ht 67.0 in | Wt 148.0 lb

## 2019-04-07 DIAGNOSIS — Z888 Allergy status to other drugs, medicaments and biological substances status: Secondary | ICD-10-CM | POA: Insufficient documentation

## 2019-04-07 DIAGNOSIS — R519 Headache, unspecified: Secondary | ICD-10-CM | POA: Diagnosis not present

## 2019-04-07 DIAGNOSIS — Z79890 Hormone replacement therapy (postmenopausal): Secondary | ICD-10-CM | POA: Diagnosis not present

## 2019-04-07 DIAGNOSIS — M5481 Occipital neuralgia: Secondary | ICD-10-CM | POA: Insufficient documentation

## 2019-04-07 DIAGNOSIS — M792 Neuralgia and neuritis, unspecified: Secondary | ICD-10-CM | POA: Diagnosis not present

## 2019-04-07 DIAGNOSIS — Z79899 Other long term (current) drug therapy: Secondary | ICD-10-CM | POA: Insufficient documentation

## 2019-04-07 MED ORDER — LIDOCAINE HCL 4 % EX SOLN
CUTANEOUS | Status: AC
  Filled 2019-04-07: qty 50

## 2019-04-07 MED ORDER — LIDOCAINE IN D5W 4-5 MG/ML-% IV SOLN
4.0000 mg/min | INTRAVENOUS | Status: AC
  Administered 2019-04-07: 4 mg/min via INTRAVENOUS

## 2019-04-07 NOTE — Progress Notes (Signed)
PROVIDER NOTE: Information contained herein reflects review and annotations entered in association with encounter. Interpretation of such information and data should be left to medically-trained personnel. Information provided to patient can be located elsewhere in the medical record under "Patient Instructions". Document created using STT-dictation technology, any transcriptional errors that may result from process are unintentional.    Patient: Martha Aguilar  Service Category: Procedure  Provider: Gillis Santa, MD  DOB: 1970-01-11  DOS: 04/07/2019  Location: [facility]  MRN: [id number]  Setting: Ambulatory - outpatient  Referring Provider: Sinda Du, MD  Type: Established Patient  Specialty: Interventional Pain Management  PCP: Patient, No Pcp Per   Primary Reason for Visit: Interventional Pain Management Treatment. CC: Headache (back, right)  Procedure:            Type: Palliative Intravenous lidocaine infusion Region: Systemic Level: Upper Extremity IV access Laterality: Please see nurses note.     Indications: 1. Neuropathic pain    Pain Score: Pre-procedure: 9 /10 Post-procedure: 6 /10   Since the patient's last visit with me, she went to [facility] for a cervical spinal cord stimulator trial which was effective for her occipital neuralgia.  She states that she will go back in 2 to 3 weeks to have permanent implant placed.  Pre-op Assessment:  Martha Aguilar is a 49 y.o. (year old), female patient, seen today for interventional treatment. She  has a past surgical history that includes uterine polyps; Tubal ligation; uterine ablation; Esophagogastroduodenoscopy (egd) with propofol (N/A, 06/13/2018); Colonoscopy (N/A, 10/23/2018); polypectomy (10/23/2018); and biopsy (10/23/2018). Martha Aguilar has a current medication list which includes the following prescription(s): alpha-lipoic acid, alprazolam, baclofen, boswellia serrata, butalbital-acetaminophen-caffeine, vitamin d3,  fluticasone, gabapentin, emgality, levothyroxine, magnesium, melatonin, multivitamin with minerals, niacin, NON FORMULARY, nurtec, fish oil, ondansetron, phenazopyridine, premarin, progesterone, sumatriptan, tizanidine, valacyclovir, vitamin c, zolpidem, amitriptyline, cephalexin, duloxetine, vyepti, hydrocodone-acetaminophen, polyethylene glycol powder, metamucil smooth texture, sumatriptan, and ubrelvy. Her primarily concern today is the Headache (back, right)  Initial Vital Signs:  Pulse/HCG Rate: 93ECG Heart Rate: 81 Temp: 97.8 F (36.6 C) Resp: 16 BP: (!) 142/100 SpO2: 100 %  BMI: Estimated body mass index is 23.18 kg/m as calculated from the following:   Height as of this encounter: 5\' 7"  (1.702 m).   Weight as of this encounter: 148 lb (67.1 kg).  Risk Assessment: Allergies: Reviewed. She is allergic to aimovig [erenumab]; other; and compazine [prochlorperazine].  Allergy Precautions: None required Coagulopathies: Reviewed. None identified.  Blood-thinner therapy: None at this time Active Infection(s): Reviewed. None identified. Martha Aguilar is afebrile  Site Confirmation: Martha Aguilar was asked to confirm the procedure and laterality before marking the site Procedure checklist: Completed Consent: Before the procedure and under the influence of no sedative(s), amnesic(s), or anxiolytics, the patient was informed of the treatment options, risks and possible complications. To fulfill our ethical and legal obligations, as recommended by the American Medical Association's Code of Ethics, I have informed the patient of my clinical impression; the nature and purpose of the treatment or procedure; the risks, benefits, and possible complications of the intervention; the alternatives, including doing nothing; the risk(s) and benefit(s) of the alternative treatment(s) or procedure(s); and the risk(s) and benefit(s) of doing nothing. The patient was provided information about the general risks and  possible complications associated with any invasive procedure. These may include, but are not limited to: failure to achieve desired goals; pain; worsening of initial condition; infections; bleeding; organ or nerve damage; allergic reactions; and death. In addition,  the patient was informed of those risks and complications associated to this procedure, such as failure to decrease pain; infection; bleeding; phlebitis; vascular extravasation; tissue necrosis; tenderness at IV access site; skin or nerve damage with subsequent damage to sensory, motor, and/or autonomic systems; worsening of the pain; persistent pain, numbness, and/or weakness of one or several areas of the body; cardiac dysrhythmias; seizure; stroke; and/or death. Furthermore, the patient was informed of those risks and complications associated with the medications used during the procedure. These include, but are not limited to: allergic reactions (i.e.: anaphylactic or anaphylactoid reactions); cardiac conduction blockade; poisoning; toxicity; CNS depression; cardiovascular depression and collapse; muscle twitching; tonic-clonic seizures; convulsions; loss of consciousness; coma; respiratory depression; arrest; and/or death. Finally, the patient was informed that Medicine is not an exact science; therefore, there is also the possibility of unforeseen or unpredictable risks and/or possible complications that may result in a catastrophic outcome. The patient indicated having understood very clearly. We have given the patient no guarantees and we have made no promises. Enough time was given to the patient to ask questions, all of which were answered to the patient's satisfaction. Martha Aguilar has indicated that she wanted to continue with the procedure. Attestation: I, the ordering provider, attest that I have discussed with the patient the benefits, risks, side-effects, alternatives, likelihood of achieving goals, and potential problems during  recovery for the procedure that I have provided informed consent. Date  Time: 04/07/2019 11:53 AM  Pre-Procedure Preparation:  Monitoring: As per clinic protocol. Respiration, ETCO2, SpO2, BP, heart rate and rhythm monitor placed and checked for adequate function Safety Precautions: Patient was assessed for positional comfort and pressure points before starting the procedure. Time-out: I initiated and conducted the "Time-out" before starting the procedure, as per protocol. The patient was asked to participate by confirming the accuracy of the "Time Out" information. Verification of the correct person, site, and procedure were performed and confirmed by me, the nursing staff, and the patient. "Time-out" conducted as per Joint Commission's Universal Protocol (UP.01.01.01). Time: 1215  Description of Procedure:          Target Area: Intravenous Approach: Intravenous angiocath approach. Area Prepped: Antecubital Prepping solution: DuraPrep (Iodine Povacrylex [0.7% available iodine] and Isopropyl Alcohol, 74% w/w) Safety Precautions: Medications properly checked for expiration dates. SDV (single dose vial) medications used.  Dose Calculation: Lidocaine preparation: 2 grams of IV lidocaine in 500 mL of D5W (4 mg/mL) (0.4% Lidocaine) Maximum Lidocaine Dose: 4 mg/kg x 148 lb (67.1 kg) =  268 mg.  Calculated infusion rate: 268 mg divided by 4 mg/mL = 67 mL in 60 minutes (67 mL/hr) Set rate: 67 mL/hr Duration of Infusion: 1 hour  Description of the Procedure: Protocol guidelines were followed. The patient was placed in position. Informed consent was obtained.  She was cautioned to be on the look out for prodromal symptoms of a possible impending seizure, such as: new onset tinnitus; perioral, circumoral, or tongue numbness; or a metallic taste in her mouth, lightheadedness; dizziness; visual or auditory disturbances; disorientation; profound drowsiness; or muscle twitching. An IV was started and the  patient's dose calculated as above. The infusion was carried out with a nurse at her side 100% of the time, monitoring her vitals as per protocol. Pre-procedure sedation was started 5-10 minutes before infusion and midazolam was kept at bedside during the entire procedure, along with CPR equipment, as per protocol.  Vitals:   04/07/19 1314 04/07/19 1319 04/07/19 1324 04/07/19 1327  BP: (!) 136/99 Marland Kitchen)  141/95 (!) 135/96 (!) 135/96  Pulse:      Resp: 18 18 18 18   Temp:      TempSrc:      SpO2: 94% 93% 95% (!) 88%  Weight:      Height:        Start Time: 1226 hrs. End Time:   hrs. Materials: IV infusion pump Medication(s): IV Lidocaine.  Imaging Guidance:          Type of Imaging Technique: None used Indication(s): N/A Exposure Time: No patient exposure Contrast: None used. Fluoroscopic Guidance: N/A Ultrasound Guidance: N/A Interpretation: N/A  Antibiotic Prophylaxis:   Anti-infectives (From admission, onward)   None     Indication(s): None identified  Post-operative Assessment:  Post-procedure Vital Signs:  Pulse/HCG Rate: 9387 Temp: 97.8 F (36.6 C) Resp: 18 BP: (!) 135/96 SpO2: (!) 88 %  EBL: None  Complications: No immediate post-treatment complications observed by team, or reported by patient.  Note: The patient tolerated the entire procedure well. A repeat set of vitals were taken after the procedure and the patient was kept under observation following institutional policy, for this type of procedure. Post-procedural neurological assessment was performed, showing return to baseline, prior to discharge. The patient was provided with post-procedure discharge instructions, including a section on how to identify potential problems. Should any problems arise concerning this procedure, the patient was given instructions to immediately contact us, at any time, without hesitation. In any case, we plan to contact the patient by telephone for a follow-up status report  regarding this interventional procedure.  Comments:  No additional relevant information.  Plan of Care   Procedure Orders    No procedure(s) ordered today    Medications ordered for procedure: Meds ordered this encounter  Medications  . lidocaine (cardiac) 2000 mg in dextrose 5% 500 mL (4mg /mL) IV infusion    IV Lidocaine 2 grams in 500 mL of Lactated Ringer's(LR). (4 mg/mL) (0.4% Lidocaine)   Medications administered: We administered lidocaine.  See the medical record for exact dosing rate.  New Prescriptions   No medications on file   Disposition: Discharge home  Discharge Date & Time: 04/07/2019; 1335 hrs.   Physician-requested Follow-up: Return in about 4 weeks (around 05/05/2019) for Post Procedure Evaluation, virtual. Recent Visits Date Type Provider Dept  01/28/19 Office Visit Gillis Santa, MD [facility]  Showing recent visits within past 90 days and meeting all other requirements   Today's Visits Date Type Provider Dept  04/07/19 Procedure visit Gillis Santa, MD [facility]  Showing today's visits and meeting all other requirements   Future Appointments Date Type Provider Dept  04/28/19 Appointment Gillis Santa, MD [facility]  05/06/19 Appointment Gillis Santa, MD [facility]  Showing future appointments within next 90 days and meeting all other requirements   Primary Care Physician: Patient, No Pcp Per Location: [facility] Note by: Gillis Santa, MD Date: 04/07/2019; Time: 1:57 PM  Disclaimer:  Medicine is not an Chief Strategy Officer. The only guarantee in medicine is that nothing is guaranteed. It is important to note that the decision to proceed with this intervention was based on the information collected from the patient. The Data and conclusions were drawn from the patient's questionnaire, the interview, and the physical examination. Because the information was provided in large part by the  patient, it cannot be guaranteed that it has not been purposely or unconsciously manipulated. Every effort has been made to obtain as much relevant  data as possible for this evaluation. It is important to note that the conclusions that lead to this procedure are derived in large part from the available data. Always take into account that the treatment will also be dependent on availability of resources and existing treatment guidelines, considered by other Pain Management Practitioners as being common knowledge and practice, at the time of the intervention. For Medico-Legal purposes, it is also important to point out that variation in procedural techniques and pharmacological choices are the acceptable norm. The indications, contraindications, technique, and results of the above procedure should only be interpreted and judged by a Board-Certified Interventional Pain Specialist with extensive familiarity and expertise in the same exact procedure and technique.

## 2019-04-07 NOTE — Progress Notes (Signed)
Safety precautions to be maintained throughout the outpatient stay will include: orient to surroundings, keep bed in low position, maintain call bell within reach at all times, provide assistance with transfer out of bed and ambulation.  

## 2019-04-08 ENCOUNTER — Telehealth: Payer: Self-pay | Admitting: *Deleted

## 2019-04-08 NOTE — Telephone Encounter (Signed)
Attempted to call for post procedure follow-up. Message left. 

## 2019-04-10 ENCOUNTER — Encounter: Payer: Self-pay | Admitting: Student in an Organized Health Care Education/Training Program

## 2019-04-11 ENCOUNTER — Other Ambulatory Visit: Payer: Self-pay | Admitting: Student in an Organized Health Care Education/Training Program

## 2019-04-11 DIAGNOSIS — M792 Neuralgia and neuritis, unspecified: Secondary | ICD-10-CM

## 2019-04-11 DIAGNOSIS — M5481 Occipital neuralgia: Secondary | ICD-10-CM

## 2019-04-21 DIAGNOSIS — L988 Other specified disorders of the skin and subcutaneous tissue: Secondary | ICD-10-CM | POA: Diagnosis not present

## 2019-04-21 DIAGNOSIS — L253 Unspecified contact dermatitis due to other chemical products: Secondary | ICD-10-CM | POA: Diagnosis not present

## 2019-04-21 DIAGNOSIS — D485 Neoplasm of uncertain behavior of skin: Secondary | ICD-10-CM | POA: Diagnosis not present

## 2019-04-22 ENCOUNTER — Ambulatory Visit: Payer: BC Managed Care – PPO | Attending: Internal Medicine

## 2019-04-22 ENCOUNTER — Other Ambulatory Visit: Payer: Self-pay

## 2019-04-22 DIAGNOSIS — Z20822 Contact with and (suspected) exposure to covid-19: Secondary | ICD-10-CM | POA: Diagnosis not present

## 2019-04-23 LAB — NOVEL CORONAVIRUS, NAA: SARS-CoV-2, NAA: NOT DETECTED

## 2019-04-23 LAB — SARS-COV-2, NAA 2 DAY TAT

## 2019-04-28 ENCOUNTER — Ambulatory Visit: Payer: BC Managed Care – PPO | Admitting: Student in an Organized Health Care Education/Training Program

## 2019-04-28 ENCOUNTER — Encounter: Payer: Self-pay | Admitting: Student in an Organized Health Care Education/Training Program

## 2019-04-28 DIAGNOSIS — M7918 Myalgia, other site: Secondary | ICD-10-CM

## 2019-04-28 DIAGNOSIS — M792 Neuralgia and neuritis, unspecified: Secondary | ICD-10-CM

## 2019-04-30 ENCOUNTER — Encounter: Payer: BC Managed Care – PPO | Admitting: Student in an Organized Health Care Education/Training Program

## 2019-04-30 ENCOUNTER — Telehealth: Payer: Self-pay

## 2019-04-30 DIAGNOSIS — E039 Hypothyroidism, unspecified: Secondary | ICD-10-CM | POA: Diagnosis not present

## 2019-04-30 DIAGNOSIS — Z713 Dietary counseling and surveillance: Secondary | ICD-10-CM | POA: Diagnosis not present

## 2019-04-30 DIAGNOSIS — Z3A24 24 weeks gestation of pregnancy: Secondary | ICD-10-CM | POA: Diagnosis not present

## 2019-04-30 NOTE — Telephone Encounter (Signed)
Attempted to call about VV on 1/22. No answer, left message to call our office.

## 2019-05-01 ENCOUNTER — Other Ambulatory Visit: Payer: Self-pay

## 2019-05-01 ENCOUNTER — Encounter: Payer: Self-pay | Admitting: Student in an Organized Health Care Education/Training Program

## 2019-05-01 ENCOUNTER — Ambulatory Visit
Payer: BC Managed Care – PPO | Attending: Student in an Organized Health Care Education/Training Program | Admitting: Student in an Organized Health Care Education/Training Program

## 2019-05-01 DIAGNOSIS — M5481 Occipital neuralgia: Secondary | ICD-10-CM

## 2019-05-01 DIAGNOSIS — M792 Neuralgia and neuritis, unspecified: Secondary | ICD-10-CM | POA: Diagnosis not present

## 2019-05-01 MED ORDER — GABAPENTIN 800 MG PO TABS: 800.0000 mg | ORAL_TABLET | Freq: Three times a day (TID) | ORAL | 5 refills | Status: DC

## 2019-05-01 NOTE — Progress Notes (Signed)
Patient: Martha Aguilar  Service Category: E/M  Provider: Gillis Santa, MD  DOB: Aug 23, 1970  DOS: 05/01/2019  Location: Office  MRN: [id number]  Setting: Ambulatory outpatient  Referring Provider: Sinda Du, MD  Type: Established Patient  Specialty: Interventional Pain Management  PCP: Patient, No Pcp Per  Location: Home  Delivery: TeleHealth     Virtual Encounter - Pain Management PROVIDER NOTE: Information contained herein reflects review and annotations entered in association with encounter. Interpretation of such information and data should be left to medically-trained personnel. Information provided to patient can be located elsewhere in the medical record under "Patient Instructions". Document created using STT-dictation technology, any transcriptional errors that may result from process are unintentional.    Contact & Pharmacy Preferred: [phone] Home: [phone] (home) Mobile: [phone] (mobile) E-mail: [email or web address] .[place], [place] [address] Phone: [phone] Fax: [phone]  [place], [place] [address] [place] [address] Phone: [phone] Fax: [phone]  [facility] [place], [place] - [address] [place] [address] Phone: [phone] Fax: [phone]   Pre-screening  Martha Aguilar offered "in-person" vs "virtual" encounter. Martha Aguilar indicated preferring virtual for this encounter.   Reason COVID-19*  Social distancing based on CDC and AMA recommendations.   I contacted Martha Aguilar on 05/01/2019 via telephone.      I clearly identified myself as Gillis Santa, MD. I verified that I was speaking with the correct person using two identifiers (Name: Martha Aguilar, and date of birth: [birth year]).  This visit was completed via telephone due to  the restrictions of the COVID-19 pandemic. All issues as above were discussed and addressed but no physical exam was performed. If it was felt that the patient should be evaluated in the office, they were directed there. The patient verbally consented to this visit. Patient was unable to complete an audio/visual visit due to Technical difficulties and/or Lack of internet. Due to the catastrophic nature of the COVID-19 pandemic, this visit was done through audio contact only.  Location of the patient: home address (see Epic for details)  Location of the provider: office  Consent I sought verbal advanced consent from Martha Aguilar for virtual visit interactions. I informed Martha Aguilar of possible security and privacy concerns, risks, and limitations associated with providing "not-in-person" medical evaluation and management services. I also informed Martha Aguilar of the availability of "in-person" appointments. Finally, I informed her that there would be a charge for the virtual visit and that Martha Aguilar could be  personally, fully or partially, financially responsible for it. Martha Aguilar expressed understanding and agreed to proceed.   Historic Elements   Martha Aguilar is a 49 y.o. year old, female patient evaluated today after her last contact with our practice on 04/30/2019. Martha Aguilar  has a past medical history of Headache(784.0), Hypothyroidism, Migraines, and Occipital neuralgia. Martha Aguilar also  has a past surgical history that includes uterine polyps; Tubal ligation; uterine ablation; Esophagogastroduodenoscopy (egd) with propofol (N/A, 06/13/2018); Colonoscopy (N/A, 10/23/2018); polypectomy (10/23/2018); and biopsy (10/23/2018). Martha Aguilar has a current medication list which includes the following prescription(s): butalbital-acetaminophen-caffeine, vitamin d3, fluticasone, gabapentin, emgality, levothyroxine, magnesium, melatonin, multivitamin with minerals, nurtec, fish oil, ondansetron, premarin, progesterone,  sumatriptan, sumatriptan, tizanidine, valacyclovir, vitamin c, zolpidem, alpha-lipoic acid, alprazolam, baclofen, boswellia serrata, cephalexin, hydrocodone-acetaminophen, niacin, NON FORMULARY, and phenazopyridine.  Martha Aguilar  reports that Martha Aguilar has never smoked. Martha Aguilar has never used smokeless tobacco. Martha Aguilar reports current alcohol use. Martha Aguilar reports that Martha Aguilar does not use drugs. Martha Aguilar is allergic to aimovig [erenumab]; other; carbamazepine; and compazine [prochlorperazine].   HPI  Today, Martha Aguilar is being contacted for medication management.  Patient states that overall Martha Aguilar is doing well.  No changes in her history since her last visit with me.  Martha Aguilar is going to [place] next week to have a neurostimulator placed for migraine management.  Martha Aguilar is excited about this and looking forward to it as her trial went very well.  I wished her the best with this.  Patient states that Martha Aguilar would like to change her pain management provider to someone that is closer.  I have no problems with this.  In the meantime I will send in a refill for gabapentin 800 mg 3 times daily which Martha Aguilar is on.  Follow-up as needed.  Laboratory Chemistry Profile   Renal Lab Results  Component Value Date   BUN 38 (H) 03/25/2019   CREATININE 0.90 03/25/2019   GFRAA >60 03/25/2019   GFRNONAA >60 03/25/2019     Hepatic Lab Results  Component Value Date   AST 17 03/25/2019   ALT 19 03/25/2019   ALBUMIN 4.7 03/25/2019   ALKPHOS 80 03/25/2019   LIPASE 66 (H) 06/04/2018     Electrolytes Lab Results  Component Value Date   NA 137 03/25/2019   K 4.3 03/25/2019   CL 101 03/25/2019   CALCIUM 9.2 03/25/2019   MG 1.9 10/19/2011   PHOS 3.6 10/19/2011     Bone No results found for: VD25OH, VD125OH2TOT, [id number], OZ3664QI3, 25OHVITD1, 25OHVITD2, 25OHVITD3, TESTOFREE, TESTOSTERONE   Inflammation (CRP: Acute Phase) (ESR: Chronic Phase) Lab Results  Component Value Date   LATICACIDVEN 0.43 (L) 07/11/2015       Note: Above Lab results  reviewed.  Imaging  NM Hepato W/EjeCT Fract CLINICAL DATA:  Right upper quadrant pain  EXAM: NUCLEAR MEDICINE HEPATOBILIARY IMAGING WITH GALLBLADDER EF  VIEWS: Anterior right upper quadrant  RADIOPHARMACEUTICALS:  5.11 mCi Tc-24m Choletec IV  COMPARISON:  None.  FINDINGS: Liver uptake of radiotracer is unremarkable. There is prompt visualization of gallbladder and small bowel, indicating patency of the cystic and common bile ducts. The patient consumed 8 ounces of Ensure orally with calculation of the computer generated ejection fraction of radiotracer from the gallbladder. The patient did report abdominal discomfort with the oral Ensure consumption. The computer generated ejection fraction of radiotracer from the gallbladder is normal at 99%, normal greater than 33% using the oral agent.  IMPRESSION: Normal ejection fraction of radiotracer from the gallbladder. The patient did experience abdominal discomfort with the oral Ensure consumption. Cystic and common bile ducts are patent as is evidenced by visualization of gallbladder and small bowel.  Electronically Signed   By: WLowella GripIII M.D.   On: 05/31/2018 15:19  Assessment  Diagnoses of Neuropathic pain and Bilateral occipital neuralgia were pertinent to this visit.  Plan of Care  Martha Aguilar has a current medication list which includes the following long-term medication(s): gabapentin, levothyroxine, premarin, and progesterone.  Pharmacotherapy (Medications Ordered): Meds ordered this encounter  Medications  . gabapentin (NEURONTIN) 800 MG tablet    Sig: Take 1 tablet (800 mg total) by mouth 3 (three) times daily.    Dispense:  90 tablet    Refill:  5   Follow-up plan:   Return if symptoms worsen or  fail to improve.    I discussed the assessment and treatment plan with the patient. The patient was provided an opportunity to ask questions and all were answered. The patient agreed with the plan  and demonstrated an understanding of the instructions.  Patient advised to call back or seek an in-person evaluation if the symptoms or condition worsens.  Duration of encounter: 15 minutes.  Note by: Gillis Santa, MD Date: 05/01/2019; Time: 11:03 AM

## 2019-05-05 DIAGNOSIS — G518 Other disorders of facial nerve: Secondary | ICD-10-CM | POA: Diagnosis not present

## 2019-05-05 DIAGNOSIS — G9059 Complex regional pain syndrome I of other specified site: Secondary | ICD-10-CM | POA: Diagnosis not present

## 2019-05-05 DIAGNOSIS — M5412 Radiculopathy, cervical region: Secondary | ICD-10-CM | POA: Diagnosis not present

## 2019-05-05 DIAGNOSIS — G894 Chronic pain syndrome: Secondary | ICD-10-CM | POA: Diagnosis not present

## 2019-05-05 DIAGNOSIS — G9050 Complex regional pain syndrome I, unspecified: Secondary | ICD-10-CM | POA: Diagnosis not present

## 2019-05-06 ENCOUNTER — Ambulatory Visit: Payer: BC Managed Care – PPO | Admitting: Student in an Organized Health Care Education/Training Program

## 2019-05-12 NOTE — Progress Notes (Deleted)
NEUROLOGY FOLLOW UP OFFICE NOTE  Martha Aguilar [id number]  HISTORY OF PRESENT ILLNESS: Martha Aguilar is a 49 year old right-handed woman who follows up for migraines and occipital neuralgia.  UPDATE: Since last visit, Cymbalta was discontinued and amitriptyline was reduced to 75mg  at bedtime.  She saw Dr. Mariea Clonts in [place], [place] for neurostimulator implant.  ***  Migraines overall are improved. Intensity:Moderate to severe Duration:1 day Frequency:2 a week Current NSAIDS:None  Rescue therapy:  Sumatriptan with Nurtec; sumatriptan [place] if wakes up with migraine. Current analgesics: none Current triptans:Sumatriptan 100mg (gradual onset during day), sumatriptan 6mg  [place](if wakes up with it) Current ergotamine:None Current anti-emetic:Zofran ODT 4mg  Current muscle relaxants:tizanidine 4mg  Q6 hrs PRN; baclofen 10mg  three times daily Current anti-anxiolytic:alprazolam Current sleep aide:alprazolam Current Antihypertensive medications:None Current Antidepressant medications:Amitriptyline 75mg ;  Current Anticonvulsant medications: gabapentin 600mg  three times daily Current anti-CGRP:Emgality, Nurtec Current Vitamins/Herbal/Supplements:Magnesium, B12, D, C, riboflavin, CoQ10, butterbur, alpha-lipoic acid Current Antihistamines/Decongestants:None Other therapy:none Hormone/birth control: Estrogen  Caffeine:No coffee. 1 Coke a day Diet:Drinks 24 to 36 oz water daily Depression:yes; Anxiety:yes Other pain:No Sleep hygiene:better  HISTORY: Onset:  49 years old Location:Middle of head,frontal Quality:Pounding, pressure Initial intensity:Severe.Shedenies new headache, thunderclap headache Aura:no Prodrome:no Postdrome:no Associated symptoms: Photophobia, phonophobia.Shedenies associated nausea, vomiting, visual disturbance/aura, autonomic symptoms,unilateral numbness or weakness. InitialDuration:Within 3  hours with sumatriptan injection InitialFrequency:Once a week InitialFrequency of abortive medication:every other day to every 2 days Triggers:  Wine, change in weather, fatigue Exacerbating factors: movement Relieving factors: Laying in darkand quiet room. Activity:aggravates  She has associated neck painthat started on March 2019. She also has bilateral occipital neuralgia described as burning sensation in back of head. She underwent decompression excision of occipital nerves.  MRI of brain with and without contrast from 04/20/17 personally reviewed and was unremarkable. MRI of cervical spine without contrast from 04/20/17 also personally reviewed and demonstrated unremarkable small disc protrusions at C5-6 and C6-7 but no explanation for headaches. She was evaluated at [facility] and underwent a lumbar puncture on 07/11/17 which demonstrated an opening and closing pressure of 15 cm H2O.   Past NSAIDS:Ibuprofen, naproxen, Toradol Past steroid: Prednisone Past analgesics:Excedrin, tramadol, Norco, Percocet, Fioricet, Lidocaine 5% patch Past abortive triptans:Sumatriptan tablet/NS/injection, Relpax, Zomig tablet Other past abortive: Reyvow (ineffective) Past abortive ergotamine:DHE capsule, DHE injection Past muscle relaxants:Flexeril, tizanidine Past anti-emetic:Reglan, Zofran, Compazine Past antihypertensive medications:Atenolol, propranolol, lisinopril, candesartan, HCTZ Past antidepressant medications:Venlafaxine, nortriptyline, Cymbalta 60mg  daily; Pristiq, Wellbutrin Past anticonvulsant medications:Topiramate, Qudexy,Tegretol,zonisamide, Depakote, gabapentin,Oxtellar XR 600mg  twice daily(rash), oxcarbazepine, Dilantin,Keppra 1000mg  at bedtime, Lyrica 100mg  three times daily, Lamictal 25mg  at bedtime Past anti-CGRP:Aimovig,Ubrelvy, Ajovy Past vitamins/Herbal/Supplements:Feverfew Past antihistamines/decongestants:none Other past  therapies:Botox,greater/lesser occipital nerve blocks/auriculotemporal nerve block/supraorbital nerve block, stem cell therapy, memantine  Family history of headache:Mom had migraines when younger  PAST MEDICAL HISTORY: Past Medical History:  Diagnosis Date  . Headache(784.0)    migraines  . Hypothyroidism   . Migraines   . Occipital neuralgia     MEDICATIONS: Current Outpatient Medications on File Prior to Visit  Medication Sig Dispense Refill  . Alpha-Lipoic Acid 300 MG CAPS Take 300 mg by mouth 3 (three) times daily.    Marland Kitchen ALPRAZolam (XANAX) 1 MG tablet Take 1 mg by mouth 2 (two) times daily as needed.   5  . baclofen (LIORESAL) 10 MG tablet Take 10 mg by mouth 3 (three) times daily.     Azucena Freed Serrata (BOSWELLIA PO) Take by mouth daily. 2 TABS DAILY    . butalbital-acetaminophen-caffeine (FIORICET) 50-325-40 MG tablet TAKE (1) TABLET  BY MOUTH EVERY SIX HOURS AS NEEDED. MAX 10 PER MONTH 10 tablet 5  . cephALEXin (KEFLEX) 500 MG capsule Take 500 mg by mouth 4 (four) times daily.    . Cholecalciferol (VITAMIN D3) 50 MCG (2000 UT) TABS Take by mouth daily.    . fluticasone (CUTIVATE) 0.05 % cream fluticasone propionate 0.05 % topical cream    . gabapentin (NEURONTIN) 800 MG tablet Take 1 tablet (800 mg total) by mouth 3 (three) times daily. 90 tablet 5  . Galcanezumab-gnlm (EMGALITY) 120 MG/ML SOAJ Inject 120 mg into the skin every 30 (thirty) days. 1 pen 11  . HYDROcodone-acetaminophen (NORCO/VICODIN) 5-325 MG tablet Take 1 tablet by mouth every 6 (six) hours as needed for moderate pain.    Marland Kitchen levothyroxine (SYNTHROID, LEVOTHROID) 150 MCG tablet Take 150 mcg by mouth daily before breakfast.    . MAGNESIUM PO Take 2,000 mg by mouth daily.    . Melatonin 12 MG TABS Take by mouth daily.    . Multiple Vitamin (MULTIVITAMIN WITH MINERALS) TABS tablet Take 1 tablet by mouth at bedtime.    . Niacin (VITAMIN B-3 PO) Take 500 mg by mouth daily.    . NON FORMULARY 2,100 mg daily.  [place]    . NURTEC 75 MG TBDP TAKE 1 TABLET DAILY IF NEEDED. MAX 1 TABLET PER DAY. 8 tablet 11  . Omega-3 Fatty Acids (FISH OIL) 1000 MG CAPS Take by mouth. 2 tabs daily    . ondansetron (ZOFRAN ODT) 4 MG disintegrating tablet Take 1-2 tablets (4 mg total) by mouth every 8 (eight) hours as needed 30 tablet 12  . phenazopyridine (PYRIDIUM) 200 MG tablet phenazopyridine 200 mg tablet  TAKE 1 TABLET BY MOUTH THREE TIMES A DAY AS NEEDED FOR PAIN    . PREMARIN 0.3 MG tablet daily.    . progesterone (PROMETRIUM) 100 MG capsule daily.    . SUMAtriptan (IMITREX) 100 MG tablet Take 100 mg by mouth daily as needed for migraine.     . SUMAtriptan 6 MG/0.5ML SOAJ Inject 6 mg into the skin as needed (Migraine).     Marland Kitchen tiZANidine (ZANAFLEX) 4 MG tablet Take 1 tablet (4 mg total) by mouth every 6 (six) hours as needed (pain). 120 tablet 3  . valACYclovir (VALTREX) 1000 MG tablet Take 1 tablet (1,000 mg total) by mouth 2 (two) times daily. 20 tablet 0  . vitamin C (ASCORBIC ACID) 500 MG tablet Take 500 mg by mouth at bedtime.    Marland Kitchen zolpidem (AMBIEN) 10 MG tablet zolpidem 10 mg tablet     No current facility-administered medications on file prior to visit.    ALLERGIES: Allergies  Allergen Reactions  . Aimovig [Erenumab] Other (See Comments)    Burning in the back of the head, severe migraine  . Other Rash    Oxtellar  Oxcarbazepine   . Carbamazepine   . Compazine [Prochlorperazine] Other (See Comments)    IV form, can take PO    FAMILY HISTORY: Family History  Problem Relation Age of Onset  . Migraines Mother        benign small bowel tumor  . Prostate cancer Father   . Seizures Brother        MVA  . Migraines Other     SOCIAL HISTORY: Social History   Socioeconomic History  . Marital status: Divorced    Spouse name: Not on file  . Number of children: 1  . Years of education: 52  . Highest education level:  Associate degree: academic program  Occupational History  . Occupation:  Nurse  Tobacco Use  . Smoking status: Never Smoker  . Smokeless tobacco: Never Used  Substance and Sexual Activity  . Alcohol use: Yes    Comment: social  . Drug use: No  . Sexual activity: Not on file  Other Topics Concern  . Not on file  Social History Narrative   Lives with daughter   Caffeine use: rare      Patient is right-handed. She lives in a 2 level home with her adopted daughter. 2 cups coffee day.    Social Determinants of Health   Financial Resource Strain:   . Difficulty of Paying Living Expenses:   Food Insecurity:   . Worried About Charity fundraiser in the Last Year:   . Arboriculturist in the Last Year:   Transportation Needs:   . Film/video editor (Medical):   Marland Kitchen Lack of Transportation (Non-Medical):   Physical Activity:   . Days of Exercise per Week:   . Minutes of Exercise per Session:   Stress:   . Feeling of Stress :   Social Connections:   . Frequency of Communication with Friends and Family:   . Frequency of Social Gatherings with Friends and Family:   . Attends Religious Services:   . Active Member of Clubs or Organizations:   . Attends Archivist Meetings:   Marland Kitchen Marital Status:   Intimate Partner Violence:   . Fear of Current or Ex-Partner:   . Emotionally Abused:   Marland Kitchen Physically Abused:   . Sexually Abused:     REVIEW OF SYSTEMS: Constitutional: No fevers, chills, or sweats, no generalized fatigue, change in appetite Eyes: No visual changes, double vision, eye pain Ear, nose and throat: No hearing loss, ear pain, nasal congestion, sore throat Cardiovascular: No chest pain, palpitations Respiratory:  No shortness of breath at rest or with exertion, wheezes GastrointestinaI: No nausea, vomiting, diarrhea, abdominal pain, fecal incontinence Genitourinary:  No dysuria, urinary retention or frequency Musculoskeletal:  No neck pain, back pain Integumentary: No rash, pruritus, skin lesions Neurological: as above Psychiatric: No  depression, insomnia, anxiety Endocrine: No palpitations, fatigue, diaphoresis, mood swings, change in appetite, change in weight, increased thirst Hematologic/Lymphatic:  No purpura, petechiae. Allergic/Immunologic: no itchy/runny eyes, nasal congestion, recent allergic reactions, rashes  PHYSICAL EXAM: *** General: No acute distress.  Patient appears ***-groomed.   Head:  Normocephalic/atraumatic Eyes:  Fundi examined but not visualized Neck: supple, no paraspinal tenderness, full range of motion Heart:  Regular rate and rhythm Lungs:  Clear to auscultation bilaterally Back: No paraspinal tenderness Neurological Exam: alert and oriented to person, place, and time. Attention span and concentration intact, recent and remote memory intact, fund of knowledge intact.  Speech fluent and not dysarthric, language intact.  CN II-XII intact. Bulk and tone normal, muscle strength 5/5 throughout.  Sensation to light touch, temperature and vibration intact.  Deep tendon reflexes 2+ throughout, toes downgoing.  Finger to nose and heel to shin testing intact.  Gait normal, Romberg negative.  IMPRESSION: ***  PLAN: ***  Metta Clines, DO  CC: ***

## 2019-05-13 ENCOUNTER — Ambulatory Visit: Payer: BC Managed Care – PPO | Admitting: Neurology

## 2019-05-13 DIAGNOSIS — R300 Dysuria: Secondary | ICD-10-CM | POA: Diagnosis not present

## 2019-05-13 DIAGNOSIS — Z6824 Body mass index (BMI) 24.0-24.9, adult: Secondary | ICD-10-CM | POA: Diagnosis not present

## 2019-05-15 NOTE — Progress Notes (Signed)
Virtual Visit via Video Note The purpose of this virtual visit is to provide medical care while limiting exposure to the novel coronavirus.    Consent was obtained for video visit:  Yes.   Answered questions that patient had about telehealth interaction:  Yes.   I discussed the limitations, risks, security and privacy concerns of performing an evaluation and management service by telemedicine. I also discussed with the patient that there may be a patient responsible charge related to this service. The patient expressed understanding and agreed to proceed.  Pt location: Home Physician Location: office Name of referring provider:  No ref. provider found I connected with Alveena Deubler Oates at patients initiation/request on 05/16/2019 at  8:30 AM EDT by video enabled telemedicine application and verified that I am speaking with the correct person using two identifiers. Pt MRN:  [id number] Pt DOB:  1970/07/12 Video Participants:  Britta Mccreedy Sally   History of Present Illness:  Rya Bisaillon is a 49 year old right-handed woman who follows up for migraines and occipital neuralgia.  UPDATE: Since last visit, Cymbalta was discontinued and amitriptyline was reduced to 75mg  at bedtime.  She saw Dr. Claiborne Billings Will at [facility] in [place], [place] for neurostimulator implant on 4/26.  Unfortunately, it has not been effective on the right side.  She is flying back Sunday for a revision.    Migraines overall are improved. Intensity:Moderate to severe Duration:1 day Frequency:2 a week Current NSAIDS:None  Rescue therapy:  Sumatriptan with Nurtec; sumatriptan [place] if wakes up with migraine. Current analgesics: none Current triptans:Sumatriptan 100mg (gradual onset during day), sumatriptan 6mg  (if wakes up with it) Current ergotamine:None Current anti-emetic:Zofran ODT 4mg  Current muscle relaxants:tizanidine 4mg  Q6 hrs PRN Current anti-anxiolytic:alprazolam Current sleep  aide:alprazolam Current Antihypertensive medications:None Current Antidepressant medications:None Current Anticonvulsant medications: gabapentin 600mg  three times daily (neuralgia) Current anti-CGRP:Emgality, Nurtec Current Vitamins/Herbal/Supplements:Magnesium, B12, D, C, riboflavin, CoQ10, butterbur, alpha-lipoic acid Current Antihistamines/Decongestants:None Other therapy:none Hormone/birth control: Estrogen  Caffeine:No coffee. 1 Coke a day Diet:Drinks 24 to 36 oz water daily Depression:yes; Anxiety:yes Other pain:No Sleep hygiene:better  HISTORY: Onset:  49 years old Location:Middle of head,frontal Quality:Pounding, pressure Initial intensity:Severe.Shedenies new headache, thunderclap headache Aura:no Prodrome:no Postdrome:no Associated symptoms: Photophobia, phonophobia.Shedenies associated nausea, vomiting, visual disturbance/aura, autonomic symptoms,unilateral numbness or weakness. InitialDuration:Within 3 hours with sumatriptan injection InitialFrequency:Once a week InitialFrequency of abortive medication:every other day to every 2 days Triggers:  Wine, change in weather, fatigue Exacerbating factors: movement Relieving factors: Laying in darkand quiet room. Activity:aggravates  She has associated neck painthat started on March 2019. She also has bilateral occipital neuralgia described as burning sensation in back of head. She underwent decompression excision of occipital nerves.  MRI of brain with and without contrast from 04/20/17 personally reviewed and was unremarkable. MRI of cervical spine without contrast from 04/20/17 also personally reviewed and demonstrated unremarkable small disc protrusions at C5-6 and C6-7 but no explanation for headaches. She was evaluated at [facility] and underwent a lumbar puncture on 07/11/17 which demonstrated an opening and closing pressure of 15 cm H2O.   Past  NSAIDS:Ibuprofen, naproxen, Toradol Past steroid: Prednisone Past analgesics:Excedrin, tramadol, Norco, Percocet, Fioricet, Lidocaine 5% patch Past abortive triptans:Sumatriptan tablet/NS/injection, Relpax, Zomig tablet Other past abortive: Reyvow (ineffective) Past abortive ergotamine:DHE capsule, DHE injection Past muscle relaxants:Flexeril, tizanidine, baclofen Past anti-emetic:Reglan, Zofran, Compazine Past antihypertensive medications:Atenolol, propranolol, lisinopril, candesartan, HCTZ Past antidepressant medications:Venlafaxine, amitriptyline, nortriptyline, Cymbalta 60mg  daily; Pristiq, Wellbutrin Past anticonvulsant medications:Topiramate, Qudexy,Tegretol,zonisamide, Depakote, gabapentin,Oxtellar XR 600mg  twice daily(rash), oxcarbazepine, Dilantin,Keppra 1000mg  at  bedtime, Lyrica 100mg  three times daily, Lamictal 25mg  at bedtime Past anti-CGRP:Aimovig,Ubrelvy, Ajovy Past vitamins/Herbal/Supplements:Feverfew Past antihistamines/decongestants:none Other past therapies:Botox,greater/lesser occipital nerve blocks/auriculotemporal nerve block/supraorbital nerve block, stem cell therapy, memantine  Family history of headache:Mom had migraines when younger  Past Medical History: Past Medical History:  Diagnosis Date  . Headache(784.0)    migraines  . Hypothyroidism   . Migraines   . Occipital neuralgia     Medications: Outpatient Encounter Medications as of 05/16/2019  Medication Sig Note  . Alpha-Lipoic Acid 300 MG CAPS Take 300 mg by mouth 3 (three) times daily. 01/22/2019: Causing stomach upset.   Marland Kitchen ALPRAZolam (XANAX) 1 MG tablet Take 1 mg by mouth 2 (two) times daily as needed.    . baclofen (LIORESAL) 10 MG tablet Take 10 mg by mouth 3 (three) times daily.    Azucena Freed Serrata (BOSWELLIA PO) Take by mouth daily. 2 TABS DAILY   . butalbital-acetaminophen-caffeine (FIORICET) 50-325-40 MG tablet TAKE (1) TABLET BY MOUTH EVERY SIX HOURS AS  NEEDED. MAX 10 PER MONTH   . cephALEXin (KEFLEX) 500 MG capsule Take 500 mg by mouth 4 (four) times daily.   . Cholecalciferol (VITAMIN D3) 50 MCG (2000 UT) TABS Take by mouth daily.   . fluticasone (CUTIVATE) 0.05 % cream fluticasone propionate 0.05 % topical cream   . gabapentin (NEURONTIN) 800 MG tablet Take 1 tablet (800 mg total) by mouth 3 (three) times daily.   . Galcanezumab-gnlm (EMGALITY) 120 MG/ML SOAJ Inject 120 mg into the skin every 30 (thirty) days.   Marland Kitchen HYDROcodone-acetaminophen (NORCO/VICODIN) 5-325 MG tablet Take 1 tablet by mouth every 6 (six) hours as needed for moderate pain. 01/22/2019: Prescribed by Dr Luan Pulling,  currently out of this medication as Dr Luan Pulling has retired as of December 2020.  Marland Kitchen levothyroxine (SYNTHROID, LEVOTHROID) 150 MCG tablet Take 150 mcg by mouth daily before breakfast.   . MAGNESIUM PO Take 2,000 mg by mouth daily.   . Melatonin 12 MG TABS Take by mouth daily.   . Multiple Vitamin (MULTIVITAMIN WITH MINERALS) TABS tablet Take 1 tablet by mouth at bedtime.   . Niacin (VITAMIN B-3 PO) Take 500 mg by mouth daily.   . NON FORMULARY 2,100 mg daily. [facility]   . NURTEC 75 MG TBDP TAKE 1 TABLET DAILY IF NEEDED. MAX 1 TABLET PER DAY.   Marland Kitchen Omega-3 Fatty Acids (FISH OIL) 1000 MG CAPS Take by mouth. 2 tabs daily   . ondansetron (ZOFRAN ODT) 4 MG disintegrating tablet Take 1-2 tablets (4 mg total) by mouth every 8 (eight) hours as needed   . phenazopyridine (PYRIDIUM) 200 MG tablet phenazopyridine 200 mg tablet  TAKE 1 TABLET BY MOUTH THREE TIMES A DAY AS NEEDED FOR PAIN   . PREMARIN 0.3 MG tablet daily.   . progesterone (PROMETRIUM) 100 MG capsule daily.   . SUMAtriptan (IMITREX) 100 MG tablet Take 100 mg by mouth daily as needed for migraine.    . SUMAtriptan 6 MG/0.5ML SOAJ Inject 6 mg into the skin as needed (Migraine).    Marland Kitchen tiZANidine (ZANAFLEX) 4 MG tablet Take 1 tablet (4 mg total) by mouth every 6 (six) hours as needed (pain).   . valACYclovir (VALTREX)  1000 MG tablet Take 1 tablet (1,000 mg total) by mouth 2 (two) times daily.   . vitamin C (ASCORBIC ACID) 500 MG tablet Take 500 mg by mouth at bedtime.   Marland Kitchen zolpidem (AMBIEN) 10 MG tablet zolpidem 10 mg tablet    No facility-administered encounter  medications on file as of 05/16/2019.    Allergies: Allergies  Allergen Reactions  . Aimovig [Erenumab] Other (See Comments)    Burning in the back of the head, severe migraine  . Other Rash    Oxtellar  Oxcarbazepine   . Carbamazepine   . Compazine [Prochlorperazine] Other (See Comments)    IV form, can take PO    Family History: Family History  Problem Relation Age of Onset  . Migraines Mother        benign small bowel tumor  . Prostate cancer Father   . Seizures Brother        MVA  . Migraines Other     Social History: Social History   Socioeconomic History  . Marital status: Divorced    Spouse name: Not on file  . Number of children: 1  . Years of education: 63  . Highest education level: Associate degree: academic program  Occupational History  . Occupation: Nurse  Tobacco Use  . Smoking status: Never Smoker  . Smokeless tobacco: Never Used  Substance and Sexual Activity  . Alcohol use: Yes    Comment: social  . Drug use: No  . Sexual activity: Not on file  Other Topics Concern  . Not on file  Social History Narrative   Lives with daughter   Caffeine use: rare      Patient is right-handed. She lives in a 2 level home with her adopted daughter. 2 cups coffee day.    Social Determinants of Health   Financial Resource Strain:   . Difficulty of Paying Living Expenses:   Food Insecurity:   . Worried About Charity fundraiser in the Last Year:   . Arboriculturist in the Last Year:   Transportation Needs:   . Film/video editor (Medical):   Marland Kitchen Lack of Transportation (Non-Medical):   Physical Activity:   . Days of Exercise per Week:   . Minutes of Exercise per Session:   Stress:   . Feeling of Stress :    Social Connections:   . Frequency of Communication with Friends and Family:   . Frequency of Social Gatherings with Friends and Family:   . Attends Religious Services:   . Active Member of Clubs or Organizations:   . Attends Archivist Meetings:   Marland Kitchen Marital Status:   Intimate Partner Violence:   . Fear of Current or Ex-Partner:   . Emotionally Abused:   Marland Kitchen Physically Abused:   . Sexually Abused:     Observations/Objective:   There were no vitals taken for this visit. No acute distress.  Alert and oriented.  Speech fluent and not dysarthric.  Language intact.  Eyes orthophoric on primary gaze.  Face symmetric.  Assessment and Plan:   1.  Chronic migraine without aura, without status migrainosus, not intractable 2.  Bilateral occipital neuralgia  1.  For preventative management, Emgality 2.  For abortive therapy, Nurtec with sumatriptan tablet; sumatriptan [place] if wakes up with migraine. 3.  Limit use of pain relievers to no more than 2 days out of week to prevent risk of rebound or medication-overuse headache. 4.  Keep headache diary 5.  Exercise, hydration, caffeine cessation, sleep hygiene, monitor for and avoid triggers 6. Follow up 6 months.   Follow Up Instructions:    -I discussed the assessment and treatment plan with the patient. The patient was provided an opportunity to ask questions and all were answered. The patient agreed with the  plan and demonstrated an understanding of the instructions.   The patient was advised to call back or seek an in-person evaluation if the symptoms worsen or if the condition fails to improve as anticipated.    Dudley Major, DO

## 2019-05-16 ENCOUNTER — Encounter: Payer: Self-pay | Admitting: Neurology

## 2019-05-16 ENCOUNTER — Other Ambulatory Visit: Payer: Self-pay

## 2019-05-16 ENCOUNTER — Telehealth (INDEPENDENT_AMBULATORY_CARE_PROVIDER_SITE_OTHER): Payer: BC Managed Care – PPO | Admitting: Neurology

## 2019-05-16 DIAGNOSIS — G43009 Migraine without aura, not intractable, without status migrainosus: Secondary | ICD-10-CM | POA: Diagnosis not present

## 2019-05-16 DIAGNOSIS — M5481 Occipital neuralgia: Secondary | ICD-10-CM | POA: Diagnosis not present

## 2019-05-19 DIAGNOSIS — G9059 Complex regional pain syndrome I of other specified site: Secondary | ICD-10-CM | POA: Diagnosis not present

## 2019-05-19 DIAGNOSIS — G894 Chronic pain syndrome: Secondary | ICD-10-CM | POA: Diagnosis not present

## 2019-05-19 DIAGNOSIS — M5412 Radiculopathy, cervical region: Secondary | ICD-10-CM | POA: Diagnosis not present

## 2019-05-19 DIAGNOSIS — G8929 Other chronic pain: Secondary | ICD-10-CM | POA: Diagnosis not present

## 2019-05-19 DIAGNOSIS — G9050 Complex regional pain syndrome I, unspecified: Secondary | ICD-10-CM | POA: Diagnosis not present

## 2019-05-19 DIAGNOSIS — T85122A Displacement of implanted electronic neurostimulator (electrode) of spinal cord, initial encounter: Secondary | ICD-10-CM | POA: Diagnosis not present

## 2019-05-19 DIAGNOSIS — Z9189 Other specified personal risk factors, not elsewhere classified: Secondary | ICD-10-CM | POA: Diagnosis not present

## 2019-06-19 DIAGNOSIS — G43909 Migraine, unspecified, not intractable, without status migrainosus: Secondary | ICD-10-CM | POA: Insufficient documentation

## 2019-06-24 DIAGNOSIS — E039 Hypothyroidism, unspecified: Secondary | ICD-10-CM | POA: Diagnosis not present

## 2019-06-24 DIAGNOSIS — G4700 Insomnia, unspecified: Secondary | ICD-10-CM | POA: Diagnosis not present

## 2019-06-24 DIAGNOSIS — R102 Pelvic and perineal pain: Secondary | ICD-10-CM | POA: Diagnosis not present

## 2019-06-25 ENCOUNTER — Encounter: Payer: Self-pay | Admitting: Neurology

## 2019-06-25 NOTE — Progress Notes (Addendum)
Rasheda Cadena Key: [id number] help? Call us at [phone] Status Sent to [place] 100MG /ML solution Form [employer] [employer]it Electronic Request Form (CB)  Per fax with [employer] medication is approved for 06/25/19 - 12/24/19.

## 2019-06-30 ENCOUNTER — Other Ambulatory Visit: Payer: Self-pay

## 2019-06-30 ENCOUNTER — Other Ambulatory Visit: Payer: Self-pay | Admitting: Neurology

## 2019-06-30 DIAGNOSIS — E559 Vitamin D deficiency, unspecified: Secondary | ICD-10-CM | POA: Diagnosis not present

## 2019-06-30 DIAGNOSIS — M129 Arthropathy, unspecified: Secondary | ICD-10-CM | POA: Diagnosis not present

## 2019-06-30 DIAGNOSIS — G894 Chronic pain syndrome: Secondary | ICD-10-CM | POA: Diagnosis not present

## 2019-06-30 DIAGNOSIS — Z79899 Other long term (current) drug therapy: Secondary | ICD-10-CM | POA: Diagnosis not present

## 2019-06-30 DIAGNOSIS — G43909 Migraine, unspecified, not intractable, without status migrainosus: Secondary | ICD-10-CM | POA: Diagnosis not present

## 2019-06-30 NOTE — Progress Notes (Signed)
Vyepti Order printed and faxed to [facility]

## 2019-07-02 ENCOUNTER — Encounter: Payer: Self-pay | Admitting: Student in an Organized Health Care Education/Training Program

## 2019-07-07 DIAGNOSIS — G43009 Migraine without aura, not intractable, without status migrainosus: Secondary | ICD-10-CM | POA: Diagnosis not present

## 2019-07-07 DIAGNOSIS — G9050 Complex regional pain syndrome I, unspecified: Secondary | ICD-10-CM | POA: Diagnosis not present

## 2019-07-07 DIAGNOSIS — T85122A Displacement of implanted electronic neurostimulator (electrode) of spinal cord, initial encounter: Secondary | ICD-10-CM | POA: Diagnosis not present

## 2019-07-07 DIAGNOSIS — M5412 Radiculopathy, cervical region: Secondary | ICD-10-CM | POA: Diagnosis not present

## 2019-07-07 DIAGNOSIS — G894 Chronic pain syndrome: Secondary | ICD-10-CM | POA: Diagnosis not present

## 2019-07-15 ENCOUNTER — Telehealth: Payer: Self-pay | Admitting: Neurology

## 2019-07-15 NOTE — Telephone Encounter (Signed)
LMOVM to call Mickel Baas at [facility]. Phone and ext given

## 2019-07-15 NOTE — Telephone Encounter (Signed)
Mickel Baas from [facility] called about a referral for this patient. She said they've called her 4 times and not heard back for scheduling.   Per caller: Please share her contact information with the patient or call back with an update if referral is not needed.

## 2019-07-18 DIAGNOSIS — G894 Chronic pain syndrome: Secondary | ICD-10-CM | POA: Diagnosis not present

## 2019-07-18 DIAGNOSIS — Z79899 Other long term (current) drug therapy: Secondary | ICD-10-CM | POA: Diagnosis not present

## 2019-07-18 DIAGNOSIS — M5481 Occipital neuralgia: Secondary | ICD-10-CM | POA: Diagnosis not present

## 2019-07-25 ENCOUNTER — Other Ambulatory Visit: Payer: Self-pay | Admitting: Neurology

## 2019-07-25 DIAGNOSIS — G4709 Other insomnia: Secondary | ICD-10-CM | POA: Diagnosis not present

## 2019-07-25 DIAGNOSIS — Z6822 Body mass index (BMI) 22.0-22.9, adult: Secondary | ICD-10-CM | POA: Diagnosis not present

## 2019-07-25 DIAGNOSIS — M5481 Occipital neuralgia: Secondary | ICD-10-CM | POA: Diagnosis not present

## 2019-07-25 MED ORDER — SUMATRIPTAN SUCCINATE 100 MG PO TABS: ORAL_TABLET | ORAL | 5 refills | Status: DC

## 2019-08-03 IMAGING — DX DG LUMBAR SPINE COMPLETE 4+V
5 series · 5 of 5 positions shown · non-contrast
Comparison: None.

CLINICAL DATA: Status post fall on [REDACTED] with low back pain.

EXAM:
LUMBAR SPINE - COMPLETE 4+ VIEW

[l-spine ap]
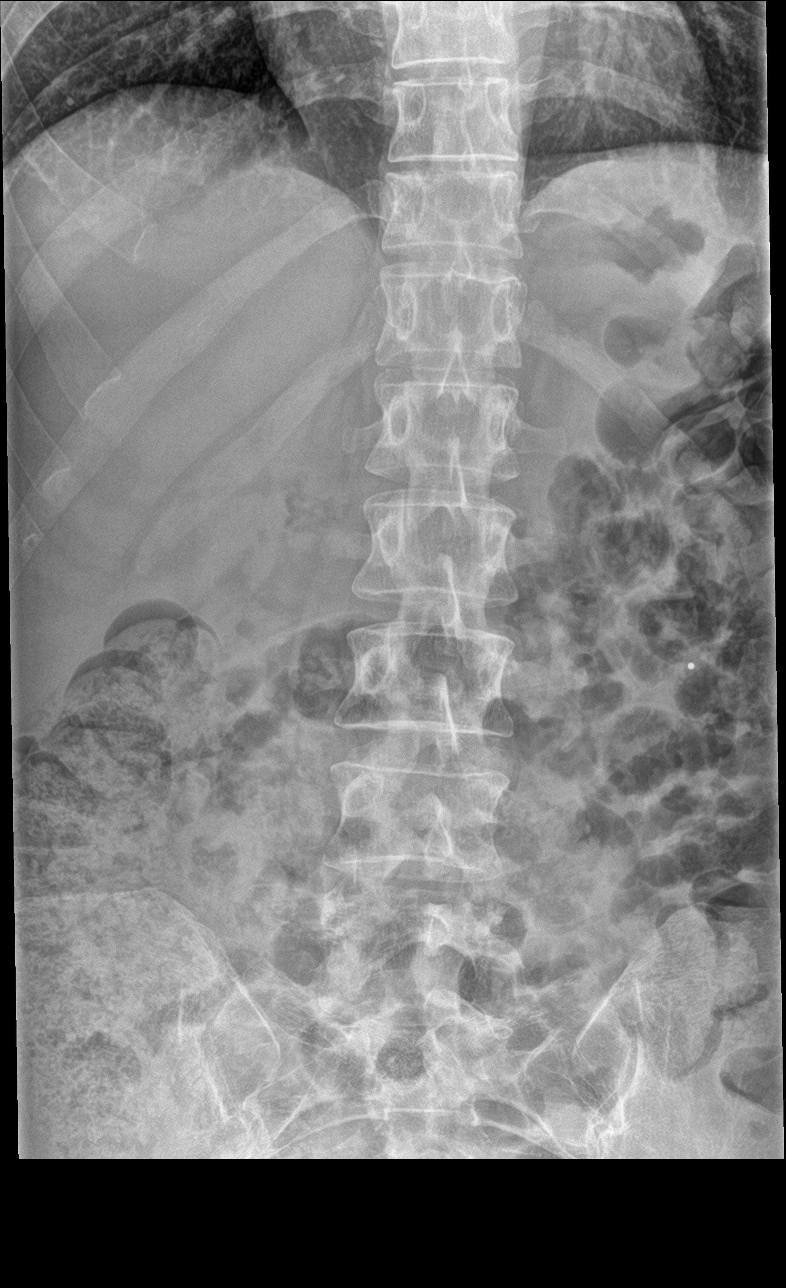

[l-spine obl (1 of 2)]
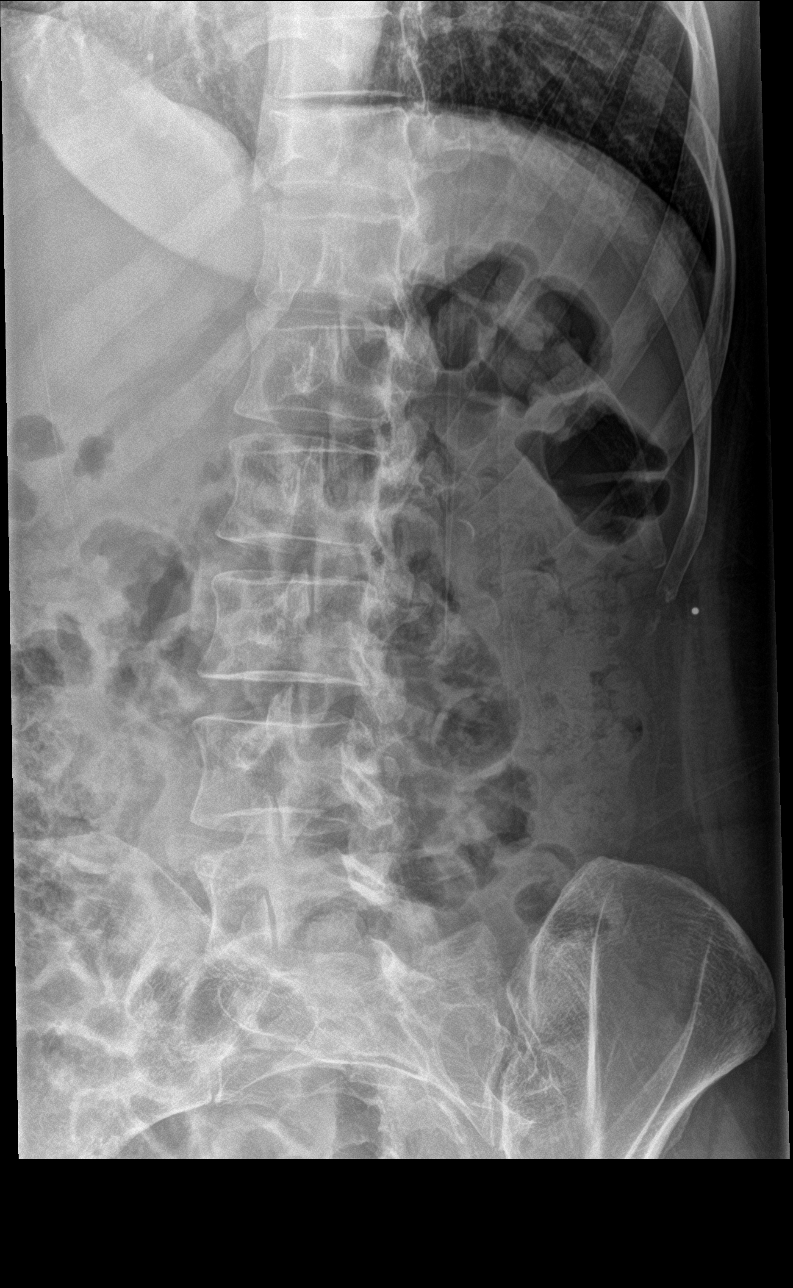

[l-spine obl (2 of 2)]
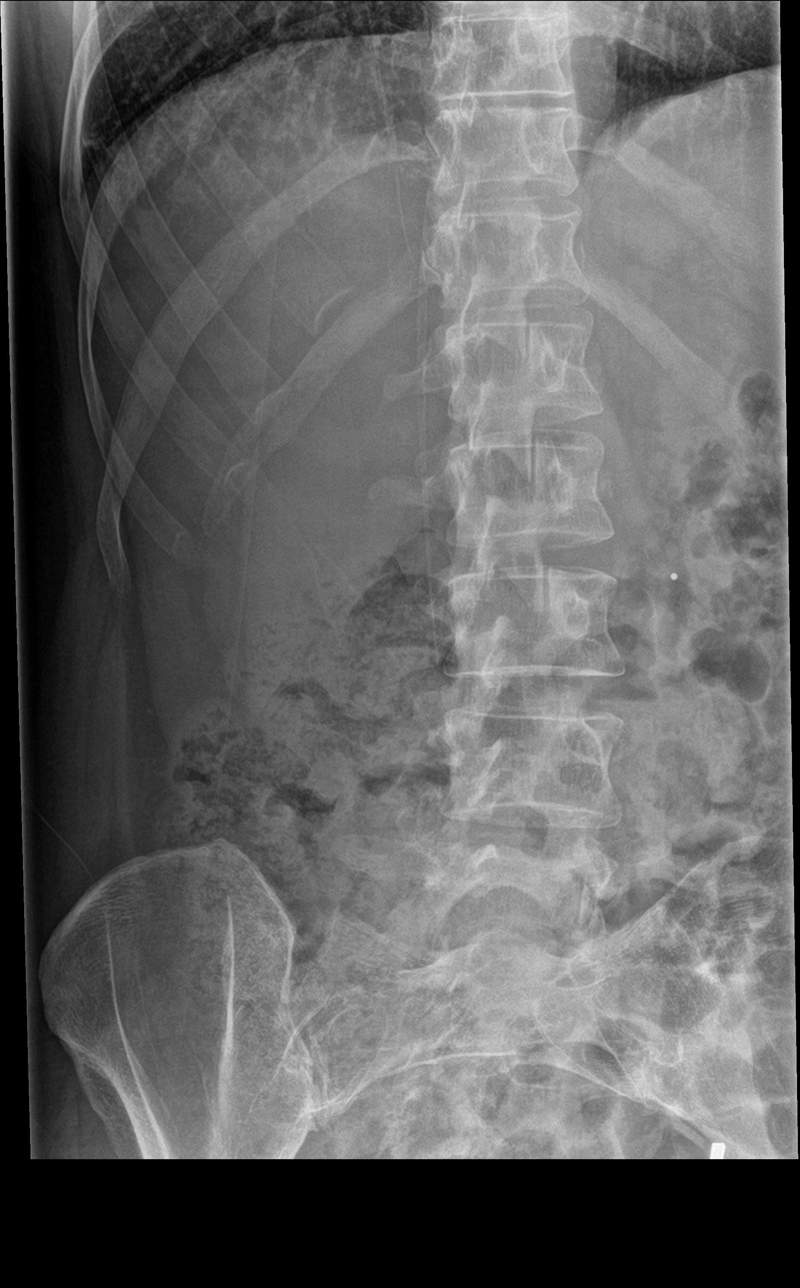

[l-spine lat]
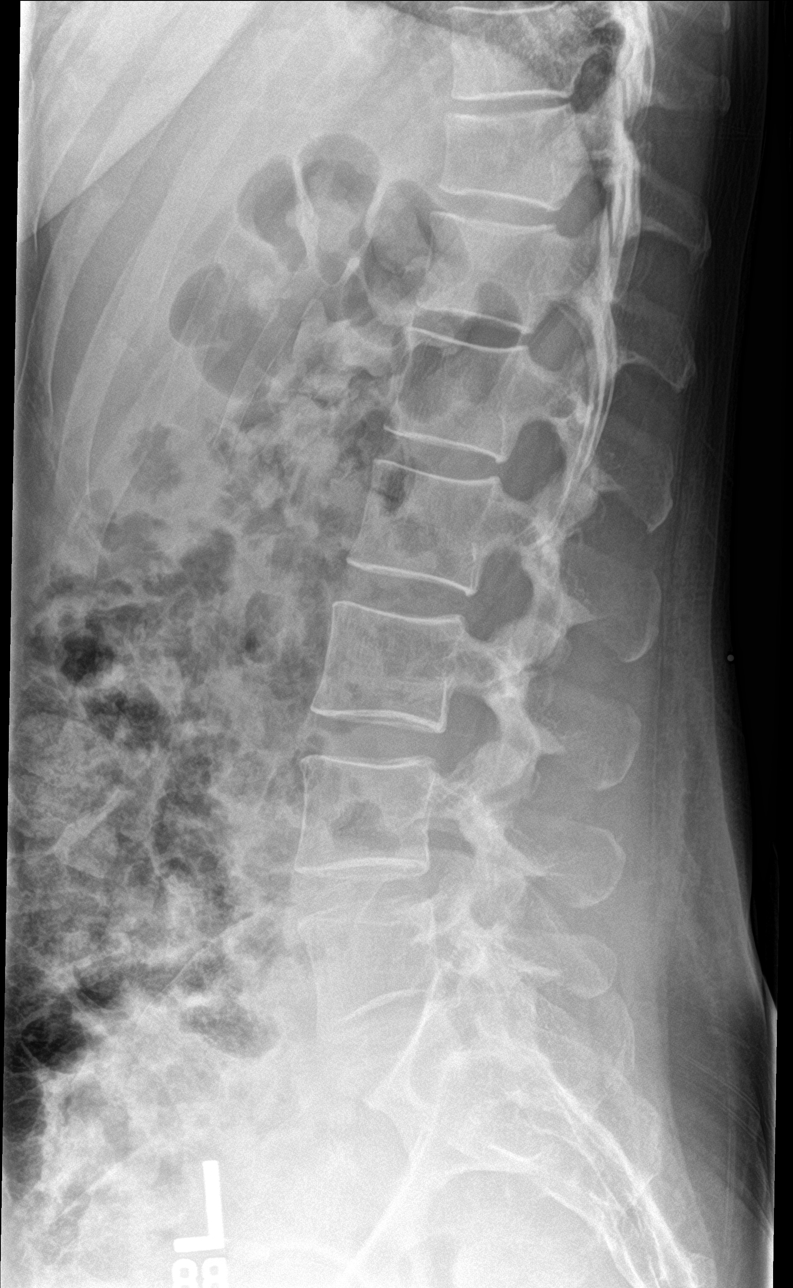

[l-spine spot]
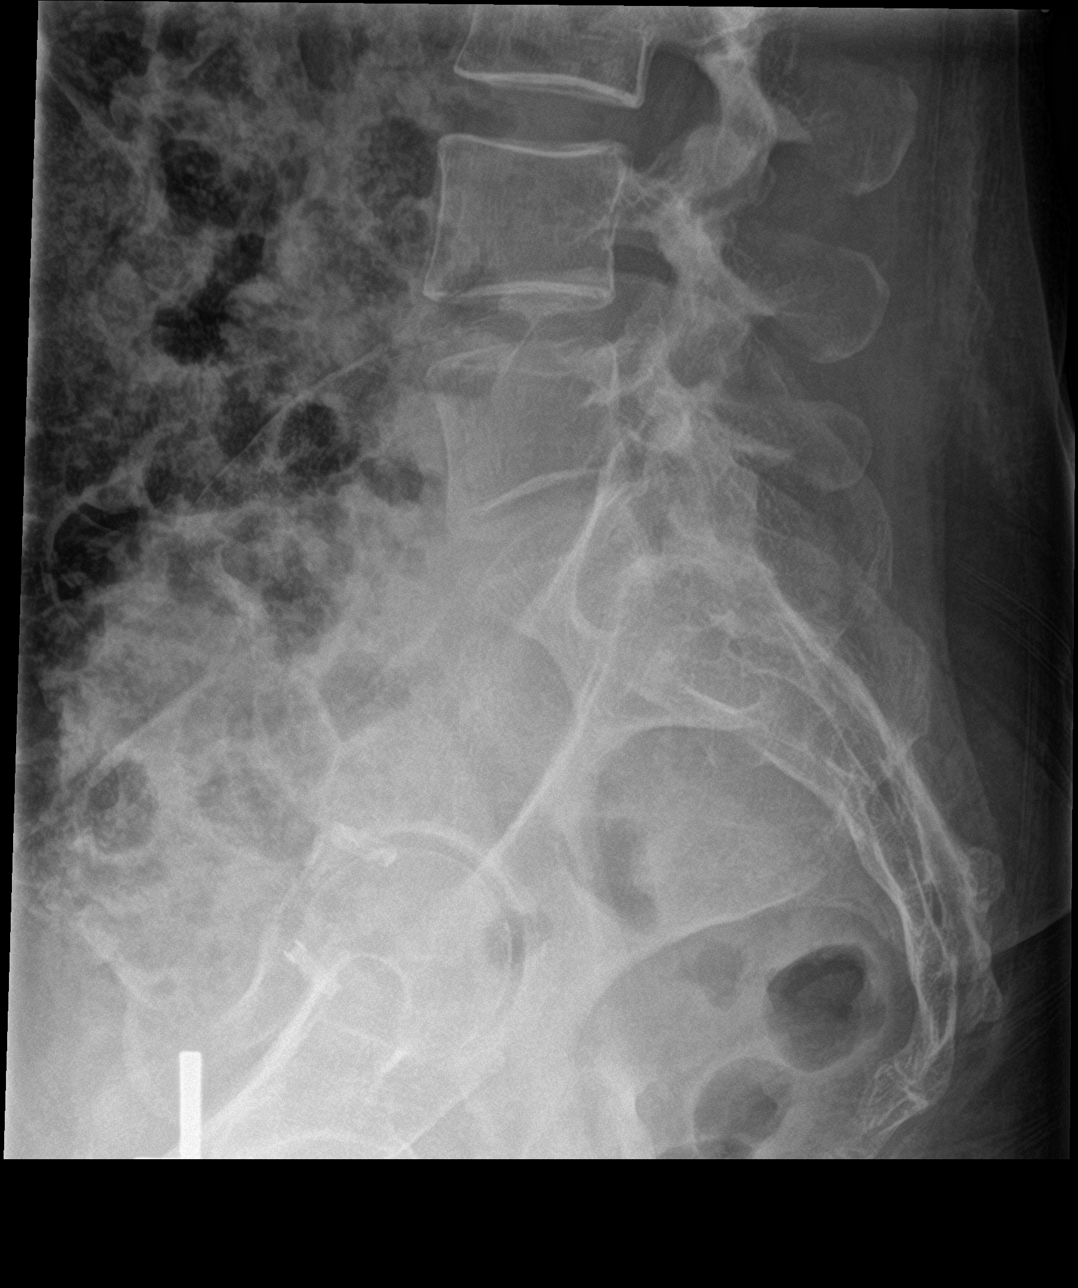

[5 of 5 positions shown; findings below may reference images not displayed]

FINDINGS: There is no evidence of lumbar spine fracture. Alignment is normal.
Intervertebral disc spaces are maintained.
IMPRESSION: Negative.

## 2019-08-21 NOTE — Progress Notes (Signed)
Per [facility] update form faxed over: Pt tolerated infusion well on 08/20/19. Next appt 11/12/19 at 1pm

## 2019-08-25 ENCOUNTER — Other Ambulatory Visit: Payer: Self-pay | Admitting: Neurology

## 2019-08-26 ENCOUNTER — Telehealth: Payer: Self-pay

## 2019-08-26 NOTE — Telephone Encounter (Signed)
emgality discontinued

## 2019-09-18 ENCOUNTER — Other Ambulatory Visit: Payer: Self-pay | Admitting: Neurology

## 2019-09-30 ENCOUNTER — Other Ambulatory Visit: Payer: Self-pay | Admitting: Neurology

## 2019-10-02 ENCOUNTER — Other Ambulatory Visit: Payer: Self-pay | Admitting: Neurology

## 2019-10-03 ENCOUNTER — Other Ambulatory Visit: Payer: Self-pay | Admitting: Neurology

## 2019-10-20 ENCOUNTER — Ambulatory Visit: Payer: BC Managed Care – PPO | Admitting: Neurology

## 2019-10-28 ENCOUNTER — Other Ambulatory Visit: Payer: Self-pay | Admitting: Neurology

## 2019-11-10 ENCOUNTER — Encounter: Payer: Self-pay | Admitting: Neurology

## 2019-11-10 NOTE — Progress Notes (Signed)
Auth#: [id number]

## 2019-11-10 NOTE — Progress Notes (Signed)
Approval valid from 11/07/19 to 11/05/20.

## 2019-11-17 NOTE — Progress Notes (Signed)
Patient fell asleep.  Did not respond to text with link x 2

## 2019-11-18 ENCOUNTER — Other Ambulatory Visit: Payer: Self-pay

## 2019-11-18 ENCOUNTER — Ambulatory Visit: Payer: BC Managed Care – PPO | Admitting: Neurology

## 2019-11-18 ENCOUNTER — Telehealth (INDEPENDENT_AMBULATORY_CARE_PROVIDER_SITE_OTHER): Payer: BLUE CROSS/BLUE SHIELD | Admitting: Neurology

## 2019-11-18 ENCOUNTER — Telehealth: Payer: Self-pay | Admitting: Neurology

## 2019-11-18 VITALS — Ht 67.0 in | Wt 139.0 lb

## 2019-11-18 DIAGNOSIS — G43009 Migraine without aura, not intractable, without status migrainosus: Secondary | ICD-10-CM

## 2019-11-18 NOTE — Telephone Encounter (Signed)
Patient called and has questions about her Vyepti infusion dosage.   She missed her appointment today with Dr. Tomi Likens due to falling asleep due to "migraine." Patient rescheduled to 06/04/2020 and has been added to the wait list.

## 2019-11-19 NOTE — Telephone Encounter (Signed)
Pt sent a my chart message to provider asking her questions.

## 2019-12-01 NOTE — Progress Notes (Deleted)
NEUROLOGY FOLLOW UP OFFICE NOTE  [facility] [id number]   Subjective:  Martha Aguilar is a 49 year old right-handed woman who follows up for migraine and occipital neuralgia.   UPDATE: Emgality was switched to Vyepti.  She received her first dose on August 11.  Migraines overall are improved. Intensity:  Moderate to severe Duration:  1 day Frequency:  2 a week Current NSAIDS:  None   Rescue therapy:  Sumatriptan with Nurtec; sumatriptan [place] if wakes up with migraine. Current analgesics: Fioricet Current triptans:  Sumatriptan 100mg  (gradual onset during day), sumatriptan 6mg  [place] (if wakes up with it) Current ergotamine:  None Current anti-emetic:  Zofran ODT 4mg  Current muscle relaxants:  tizanidine 4mg  Q6 hrs PRN Current anti-anxiolytic:  alprazolam Current sleep aide:  alprazolam Current Antihypertensive medications:  None Current Antidepressant medications:  None Current Anticonvulsant medications: gabapentin 600mg  three times daily (neuralgia) Current anti-CGRP:  Emgality, Nurtec Current Vitamins/Herbal/Supplements:  Magnesium, B12, D, C, riboflavin, CoQ10, butterbur, alpha-lipoic acid Current Antihistamines/Decongestants:  None Other therapy:  none Hormone/birth control:  Estrogen   Caffeine:  No coffee.  1 Coke a day Diet:  Drinks 24 to 36 oz water daily Depression:  yes; Anxiety:  yes Other pain:  No Sleep hygiene:  better   HISTORY: Onset:  49 years old Location:  Middle of head, frontal Quality:  Pounding, pressure Initial intensity:  Severe.  She denies new headache, thunderclap headache Aura:  no Prodrome:  no Postdrome:  no Associated symptoms:  Photophobia, phonophobia.  She denies associated nausea, vomiting, visual disturbance/aura, autonomic symptoms, unilateral numbness or weakness. Initial Duration:  Within 3 hours with sumatriptan injection Initial Frequency:  Once a week Initial Frequency of abortive medication: every other day to every 2  days Triggers:  Wine, change in weather, fatigue Exacerbating factors:  movement Relieving factors:  Laying in dark and quiet room. Activity:  aggravates   She has associated neck pain that started on March 2019.  She also has bilateral occipital neuralgia described as burning sensation in back of head.  She underwent decompression excision of occipital nerves.   MRI of brain with and without contrast from 04/20/17 personally reviewed and was unremarkable.  MRI of cervical spine without contrast from 04/20/17 also personally reviewed and demonstrated unremarkable small disc protrusions at C5-6 and C6-7 but no explanation for headaches.  [facility] on 07/11/17 which demonstrated an opening and closing pressure of 15 cm H2O.     Past NSAIDS:  Ibuprofen, naproxen, Toradol Past steroid:  Prednisone Past analgesics:  Excedrin, tramadol, Norco, Percocet, Fioricet, Lidocaine 5% patch Past abortive triptans:  Sumatriptan tablet/NS/injection, Relpax, Zomig tablet Other past abortive:  Reyvow (ineffective) Past abortive ergotamine:  DHE capsule, DHE injection Past muscle relaxants:  Flexeril, tizanidine, baclofen Past anti-emetic:  Reglan, Zofran, Compazine Past antihypertensive medications:  Atenolol, propranolol, lisinopril, candesartan, HCTZ Past antidepressant medications:  Venlafaxine, amitriptyline, nortriptyline, Cymbalta 60mg  daily; Pristiq, Wellbutrin Past anticonvulsant medications:  Topiramate, Qudexy, Tegretol, zonisamide, Depakote, gabapentin, Oxtellar XR 600mg  twice daily (rash), oxcarbazepine, Dilantin, Keppra 1000mg  at bedtime, Lyrica 100mg  three times daily, Lamictal 25mg  at bedtime Past anti-CGRP:  Aimovig, Melodie Bouillon Past vitamins/Herbal/Supplements:  Feverfew Past antihistamines/decongestants:  none Other past therapies:  Botox, greater/lesser occipital nerve blocks/auriculotemporal nerve block/supraorbital nerve block, stem cell  therapy, memantine   Family history of headache:  Mom had migraines when younger  PAST MEDICAL HISTORY: Past Medical History:  Diagnosis Date  . Headache(784.0)  migraines  . Hypothyroidism   . Migraines   . Occipital neuralgia     MEDICATIONS: Current Outpatient Medications on File Prior to Visit  Medication Sig Dispense Refill  . Alpha-Lipoic Acid 300 MG CAPS Take 300 mg by mouth 3 (three) times daily.    Marland Kitchen ALPRAZolam (XANAX) 1 MG tablet Take 1 mg by mouth 2 (two) times daily as needed.   5  . baclofen (LIORESAL) 10 MG tablet Take 10 mg by mouth 3 (three) times daily.     Azucena Freed [place] (BOSWELLIA PO) Take by mouth daily. 2 TABS DAILY    . butalbital-acetaminophen-caffeine (FIORICET) 50-325-40 MG tablet TAKE (1) TABLET BY MOUTH EVERY SIX HOURS AS NEEDED. MAX 10 PER MONTH 10 tablet 3  . cephALEXin (KEFLEX) 500 MG capsule Take 500 mg by mouth 4 (four) times daily.    . Cholecalciferol (VITAMIN D3) 50 MCG (2000 UT) TABS Take by mouth daily.    . fluticasone (CUTIVATE) 0.05 % cream fluticasone propionate 0.05 % topical cream    . gabapentin (NEURONTIN) 800 MG tablet Take 1 tablet (800 mg total) by mouth 3 (three) times daily. 90 tablet 5  . HYDROcodone-acetaminophen (NORCO/VICODIN) 5-325 MG tablet Take 1 tablet by mouth every 6 (six) hours as needed for moderate pain.    Marland Kitchen levothyroxine (SYNTHROID, LEVOTHROID) 150 MCG tablet Take 150 mcg by mouth daily before breakfast.    . MAGNESIUM PO Take 2,000 mg by mouth daily.    . Melatonin 12 MG TABS Take by mouth daily.    . Multiple Vitamin (MULTIVITAMIN WITH MINERALS) TABS tablet Take 1 tablet by mouth at bedtime.    . Niacin (VITAMIN B-3 PO) Take 500 mg by mouth daily.    . NON FORMULARY 2,100 mg daily. [facility]    . NURTEC 75 MG TBDP TAKE 1 TABLET DAILY IF NEEDED. MAX 1 TABLET PER DAY. 8 tablet 11  . Omega-3 Fatty Acids (FISH OIL) 1000 MG CAPS Take by mouth. 2 tabs daily    . ondansetron (ZOFRAN) 8 MG tablet TAKE (1) TABLET BY  MOUTH EVERY FOUR HOURS AS NEEDED. 10 tablet 1  . phenazopyridine (PYRIDIUM) 200 MG tablet phenazopyridine 200 mg tablet  TAKE 1 TABLET BY MOUTH THREE TIMES A DAY AS NEEDED FOR PAIN    . PREMARIN 0.3 MG tablet daily.    . progesterone (PROMETRIUM) 100 MG capsule daily.    . SUMAtriptan (IMITREX) 100 MG tablet Take 1 tablet earliest onset of migraine.  May repeat in 2 hours if headache persists or recurs.  Maximum 2 tablets in 24 hours 10 tablet 5  . SUMAtriptan 6 MG/0.5ML SOAJ Inject 6 mg into the skin as needed (Migraine).     Marland Kitchen tiZANidine (ZANAFLEX) 4 MG tablet TAKE ONE TABLET EVERY 6 HOURS AS NEEDED FOR PAIN 120 tablet 3  . valACYclovir (VALTREX) 1000 MG tablet Take 1 tablet (1,000 mg total) by mouth 2 (two) times daily. 20 tablet 0  . vitamin C (ASCORBIC ACID) 500 MG tablet Take 500 mg by mouth at bedtime.    Marland Kitchen zolpidem (AMBIEN) 10 MG tablet zolpidem 10 mg tablet     No current facility-administered medications on file prior to visit.    ALLERGIES: Allergies  Allergen Reactions  . Aimovig [Erenumab] Other (See Comments)    Burning in the back of the head, severe migraine  . Other Rash    Oxtellar  Oxcarbazepine   . Carbamazepine   . Compazine [Prochlorperazine] Other (See Comments)  IV form, can take PO    FAMILY HISTORY: Family History  Problem Relation Age of Onset  . Migraines Mother        benign small bowel tumor  . Prostate cancer Father   . Seizures Brother        MVA  . Migraines Other     SOCIAL HISTORY: Social History   Socioeconomic History  . Marital status: Divorced    Spouse name: Not on file  . Number of children: 1  . Years of education: 12  . Highest education level: Associate degree: academic program  Occupational History  . Occupation: Nurse  Tobacco Use  . Smoking status: Never Smoker  . Smokeless tobacco: Never Used  Vaping Use  . Vaping Use: Never used  Substance and Sexual Activity  . Alcohol use: Yes    Comment: social  . Drug  use: No  . Sexual activity: Not on file  Other Topics Concern  . Not on file  Social History Narrative   Lives with daughter   Caffeine use: rare      Patient is right-handed. She lives in a 2 level home with her adopted daughter. 2 cups coffee day.    Social Determinants of Health   Financial Resource Strain:   . Difficulty of Paying Living Expenses: Not on file  Food Insecurity:   . Worried About Charity fundraiser in the Last Year: Not on file  . Ran Out of Food in the Last Year: Not on file  Transportation Needs:   . Lack of Transportation (Medical): Not on file  . Lack of Transportation (Non-Medical): Not on file  Physical Activity:   . Days of Exercise per Week: Not on file  . Minutes of Exercise per Session: Not on file  Stress:   . Feeling of Stress : Not on file  Social Connections:   . Frequency of Communication with Friends and Family: Not on file  . Frequency of Social Gatherings with Friends and Family: Not on file  . Attends Religious Services: Not on file  . Active Member of Clubs or Organizations: Not on file  . Attends Archivist Meetings: Not on file  . Marital Status: Not on file  Intimate Partner Violence:   . Fear of Current or Ex-Partner: Not on file  . Emotionally Abused: Not on file  . Physically Abused: Not on file  . Sexually Abused: Not on file     Objective:  *** General: No acute distress.  Patient appears well-groomed.   Head:  Normocephalic/atraumatic Eyes:  Fundi examined but not visualized Neck: supple, no paraspinal tenderness, full range of motion Heart:  Regular rate and rhythm Lungs:  Clear to auscultation bilaterally Back: No paraspinal tenderness Neurological Exam: alert and oriented to person, place, and time. Attention span and concentration intact, recent and remote memory intact, fund of knowledge intact.  Speech fluent and not dysarthric, language intact.  CN II-XII intact. Bulk and tone normal, muscle strength 5/5  throughout.  Sensation to light touch, temperature and vibration intact.  Deep tendon reflexes 2+ throughout, toes downgoing.  Finger to nose and heel to shin testing intact.  Gait normal, Romberg negative.   Assessment/Plan:   Metta Clines, DO

## 2019-12-02 ENCOUNTER — Ambulatory Visit: Payer: BLUE CROSS/BLUE SHIELD | Admitting: Neurology

## 2019-12-09 ENCOUNTER — Other Ambulatory Visit: Payer: Self-pay | Admitting: Neurology

## 2019-12-09 ENCOUNTER — Encounter: Payer: Self-pay | Admitting: Neurology

## 2019-12-09 ENCOUNTER — Other Ambulatory Visit: Payer: Self-pay

## 2019-12-09 ENCOUNTER — Telehealth (INDEPENDENT_AMBULATORY_CARE_PROVIDER_SITE_OTHER): Payer: BLUE CROSS/BLUE SHIELD | Admitting: Neurology

## 2019-12-09 DIAGNOSIS — M792 Neuralgia and neuritis, unspecified: Secondary | ICD-10-CM

## 2019-12-09 DIAGNOSIS — M5481 Occipital neuralgia: Secondary | ICD-10-CM | POA: Diagnosis not present

## 2019-12-09 MED ORDER — GABAPENTIN 800 MG PO TABS: 800.0000 mg | ORAL_TABLET | Freq: Three times a day (TID) | ORAL | 5 refills | Status: DC

## 2019-12-09 MED ORDER — ONDANSETRON HCL 8 MG PO TABS: ORAL_TABLET | ORAL | 5 refills | Status: DC

## 2019-12-09 MED ORDER — TOSYMRA 10 MG/ACT NA SOLN: 10.0000 mg | NASAL | 5 refills | Status: DC | PRN

## 2019-12-09 NOTE — Progress Notes (Signed)
Virtual Visit via Video Note The purpose of this virtual visit is to provide medical care while limiting exposure to the novel coronavirus.    Consent was obtained for video visit:  Yes.   Answered questions that patient had about telehealth interaction:  Yes.   I discussed the limitations, risks, security and privacy concerns of performing an evaluation and management service by telemedicine. I also discussed with the patient that there may be a patient responsible charge related to this service. The patient expressed understanding and agreed to proceed.  Pt location: Home Physician Location: office Name of referring provider:  No ref. provider found I connected with Martha Aguilar at patients initiation/request on 12/09/2019 at  2:30 PM EST by video enabled telemedicine application and verified that I am speaking with the correct person using two identifiers. Pt MRN:  [id number] Pt DOB:  [birth year] Video Participants:  Martha Aguilar   History of Present Illness:  Martha Aguilar is a 49 year old right-handed woman who follows up for migraine and occipital neuralgia.   UPDATE: Emgality was switched to Vyepti.  She is status post 2 rounds.  No change. She is looking into other treatment options.  She is interested in ketamine infusions at [facility].  She also has an appointment with a surgeon in [place] who performs "migraine surgery" involving decompression of the trigeminal nerve branches.    Intensity:  Moderate to severe Duration:  1 day Frequency:  4 to 5 a week Current NSAIDS:  None   Rescue therapy:  Sumatriptan with Nurtec; sumatriptan [place] if wakes up with migraine. Current analgesics: none Current triptans:  Sumatriptan 100mg  (gradual onset during day), sumatriptan 6mg  [place] (if wakes up with it) Current ergotamine:  None Current anti-emetic:  Zofran ODT 4mg  Current muscle relaxants:  tizanidine 4mg  Q6 hrs PRN Current anti-anxiolytic:  alprazolam Current sleep aide:   alprazolam Current Antihypertensive medications:  None Current Antidepressant medications:  None Current Anticonvulsant medications: gabapentin 600mg  three times daily (neuralgia) Current anti-CGRP:  Emgality, Nurtec (not as effective Current Vitamins/Herbal/Supplements:  Magnesium, B12, D, C, riboflavin, CoQ10, butterbur, alpha-lipoic acid Current Antihistamines/Decongestants:  None Other therapy:  none Hormone/birth control:  Estrogen   Caffeine:  No coffee.  1 Coke a day Diet:  Drinks 24 to 36 oz water daily Depression:  yes; Anxiety:  yes Other pain:  No Sleep hygiene:  better   HISTORY: Onset:  49 years old Location:  Middle of head, frontal Quality:  Pounding, pressure Initial intensity:  Severe.  She denies new headache, thunderclap headache Aura:  no Prodrome:  no Postdrome:  no Associated symptoms:  Photophobia, phonophobia.  She denies associated nausea, vomiting, visual disturbance/aura, autonomic symptoms, unilateral numbness or weakness. Initial Duration:  Within 3 hours with sumatriptan injection Initial Frequency:  Once a week Initial Frequency of abortive medication: every other day to every 2 days Triggers:  Wine, change in weather, fatigue Exacerbating factors:  movement Relieving factors:  Laying in dark and quiet room. Activity:  aggravates   She has associated neck pain that started on March 2019.  She also has bilateral occipital neuralgia described as burning sensation in back of head.  She underwent decompression excision of occipital nerves.   MRI of brain with and without contrast from 04/20/17 personally reviewed and was unremarkable.  MRI of cervical spine without contrast from 04/20/17 also personally reviewed and demonstrated unremarkable small disc protrusions at C5-6 and C6-7 but no explanation for headaches.  She was evaluated at  [facility] and underwent a lumbar puncture on 07/11/17 which demonstrated an opening and closing pressure of 15 cm H2O.     Past  NSAIDS:  Ibuprofen, naproxen, Toradol Past steroid:  Prednisone Past analgesics:  Excedrin, tramadol, Norco, Percocet, Fioricet, Lidocaine 5% patch Past abortive triptans:  Sumatriptan tablet/NS/injection, Relpax, Zomig tablet Other past abortive:  Reyvow (ineffective) Past abortive ergotamine:  DHE capsule, DHE injection Past muscle relaxants:  Flexeril, tizanidine, baclofen Past anti-emetic:  Reglan, Zofran, Compazine Past antihypertensive medications:  Atenolol, propranolol, lisinopril, candesartan, HCTZ Past antidepressant medications:  Venlafaxine, amitriptyline, nortriptyline, Cymbalta 60mg  daily; Pristiq, Wellbutrin Past anticonvulsant medications:  Topiramate, Qudexy, Tegretol, zonisamide, Depakote, gabapentin, Oxtellar XR 600mg  twice daily (rash), oxcarbazepine, Dilantin, Keppra 1000mg  at bedtime, Lyrica 100mg  three times daily, Lamictal 25mg  at bedtime Past anti-CGRP:  Aimovig, Melodie Bouillon Past vitamins/Herbal/Supplements:  Feverfew Past antihistamines/decongestants:  none Other past therapies:  Botox, greater/lesser occipital nerve blocks/auriculotemporal nerve block/supraorbital nerve block, stem cell therapy, memantine   Family history of headache:  Mom had migraines when younger   Past Medical History: Past Medical History:  Diagnosis Date  . Headache(784.0)    migraines  . Hypothyroidism   . Migraines   . Occipital neuralgia     Medications: Outpatient Encounter Medications as of 12/09/2019  Medication Sig Note  . Alpha-Lipoic Acid 300 MG CAPS Take 300 mg by mouth 3 (three) times daily. 01/22/2019: Causing stomach upset.   Marland Kitchen ALPRAZolam (XANAX) 1 MG tablet Take 1 mg by mouth 2 (two) times daily as needed.    . baclofen (LIORESAL) 10 MG tablet Take 10 mg by mouth 3 (three) times daily.    Azucena Freed [place] (BOSWELLIA PO) Take by mouth daily. 2 TABS DAILY   . butalbital-acetaminophen-caffeine (FIORICET) 50-325-40 MG tablet TAKE (1) TABLET BY MOUTH EVERY SIX HOURS  AS NEEDED. MAX 10 PER MONTH   . cephALEXin (KEFLEX) 500 MG capsule Take 500 mg by mouth 4 (four) times daily.   . Cholecalciferol (VITAMIN D3) 50 MCG (2000 UT) TABS Take by mouth daily.   . fluticasone (CUTIVATE) 0.05 % cream fluticasone propionate 0.05 % topical cream   . gabapentin (NEURONTIN) 800 MG tablet Take 1 tablet (800 mg total) by mouth 3 (three) times daily.   Marland Kitchen HYDROcodone-acetaminophen (NORCO/VICODIN) 5-325 MG tablet Take 1 tablet by mouth every 6 (six) hours as needed for moderate pain. 01/22/2019: Prescribed by Dr Luan Pulling,  currently out of this medication as Dr Luan Pulling has retired as of December 2020.  Marland Kitchen levothyroxine (SYNTHROID, LEVOTHROID) 150 MCG tablet Take 150 mcg by mouth daily before breakfast. 11/18/2019: 175 on week day and 150 on weekend  . MAGNESIUM PO Take 2,000 mg by mouth daily.   . Melatonin 12 MG TABS Take by mouth daily.   . Multiple Vitamin (MULTIVITAMIN WITH MINERALS) TABS tablet Take 1 tablet by mouth at bedtime.   . Niacin (VITAMIN B-3 PO) Take 500 mg by mouth daily.   . NON FORMULARY 2,100 mg daily. [facility]   . NURTEC 75 MG TBDP TAKE 1 TABLET DAILY IF NEEDED. MAX 1 TABLET PER DAY.   Marland Kitchen Omega-3 Fatty Acids (FISH OIL) 1000 MG CAPS Take by mouth. 2 tabs daily   . ondansetron (ZOFRAN) 8 MG tablet TAKE (1) TABLET BY MOUTH EVERY FOUR HOURS AS NEEDED.   Marland Kitchen phenazopyridine (PYRIDIUM) 200 MG tablet phenazopyridine 200 mg tablet  TAKE 1 TABLET BY MOUTH THREE TIMES A DAY AS NEEDED FOR PAIN   . PREMARIN 0.3 MG tablet daily.   Marland Kitchen  progesterone (PROMETRIUM) 100 MG capsule daily.   . SUMAtriptan (IMITREX) 100 MG tablet Take 1 tablet earliest onset of migraine.  May repeat in 2 hours if headache persists or recurs.  Maximum 2 tablets in 24 hours   . SUMAtriptan 6 MG/0.5ML SOAJ Inject 6 mg into the skin as needed (Migraine).    Marland Kitchen tiZANidine (ZANAFLEX) 4 MG tablet TAKE ONE TABLET EVERY 6 HOURS AS NEEDED FOR PAIN   . valACYclovir (VALTREX) 1000 MG tablet Take 1 tablet (1,000 mg  total) by mouth 2 (two) times daily.   . vitamin C (ASCORBIC ACID) 500 MG tablet Take 500 mg by mouth at bedtime.   Marland Kitchen zolpidem (AMBIEN) 10 MG tablet zolpidem 10 mg tablet    No facility-administered encounter medications on file as of 12/09/2019.    Allergies: Allergies  Allergen Reactions  . Aimovig [Erenumab] Other (See Comments)    Burning in the back of the head, severe migraine  . Other Rash    Oxtellar  Oxcarbazepine   . Carbamazepine   . Compazine [Prochlorperazine] Other (See Comments)    IV form, can take PO    Family History: Family History  Problem Relation Age of Onset  . Migraines Mother        benign small bowel tumor  . Prostate cancer Father   . Seizures Brother        MVA  . Migraines Other     Social History: Social History   Socioeconomic History  . Marital status: Divorced    Spouse name: Not on file  . Number of children: 1  . Years of education: 71  . Highest education level: Associate degree: academic program  Occupational History  . Occupation: Nurse  Tobacco Use  . Smoking status: Never Smoker  . Smokeless tobacco: Never Used  Vaping Use  . Vaping Use: Never used  Substance and Sexual Activity  . Alcohol use: Yes    Comment: social  . Drug use: No  . Sexual activity: Not on file  Other Topics Concern  . Not on file  Social History Narrative   Lives with daughter   Caffeine use: rare      Patient is right-handed. She lives in a 2 level home with her adopted daughter. 2 cups coffee day.    Social Determinants of Health   Financial Resource Strain:   . Difficulty of Paying Living Expenses: Not on file  Food Insecurity:   . Worried About Charity fundraiser in the Last Year: Not on file  . Ran Out of Food in the Last Year: Not on file  Transportation Needs:   . Lack of Transportation (Medical): Not on file  . Lack of Transportation (Non-Medical): Not on file  Physical Activity:   . Days of Exercise per Week: Not on file  .  Minutes of Exercise per Session: Not on file  Stress:   . Feeling of Stress : Not on file  Social Connections:   . Frequency of Communication with Friends and Family: Not on file  . Frequency of Social Gatherings with Friends and Family: Not on file  . Attends Religious Services: Not on file  . Active Member of Clubs or Organizations: Not on file  . Attends Archivist Meetings: Not on file  . Marital Status: Not on file  Intimate Partner Violence:   . Fear of Current or Ex-Partner: Not on file  . Emotionally Abused: Not on file  . Physically Abused: Not on  file  . Sexually Abused: Not on file    Observations/Objective:   There were no vitals taken for this visit. No acute distress.  Alert and oriented.  Speech fluent and not dysarthric.  Language intact.   Assessment and Plan:   1.  Chronic migraine without aura, without status migrainosus, not intractable 2.  Bilateral occipital neuralgia  1.  Will increase Vyepti to 300mg  2.  For rescue, will have her try Tosymra.  Advised not to use with other triptan products 3.  Zofran for nausea 4.  Gabapentin 800mg  TID 5.  Limit use of pain relievers to no more than 2 days out of week to prevent risk of rebound or medication-overuse headache. 6.  Keep headache diary 7.  Follow up 6 months.   Follow Up Instructions:    -I discussed the assessment and treatment plan with the patient. The patient was provided an opportunity to ask questions and all were answered. The patient agreed with the plan and demonstrated an understanding of the instructions.   The patient was advised to call back or seek an in-person evaluation if the symptoms worsen or if the condition fails to improve as anticipated.    Dudley Major, DO

## 2019-12-09 NOTE — Telephone Encounter (Signed)
Script sent during visit

## 2019-12-19 ENCOUNTER — Encounter: Payer: Self-pay | Admitting: Neurology

## 2019-12-19 NOTE — Progress Notes (Signed)
Denial received on patient for the Tosymra medication. Medication was sent to [facility] and they should help patient. Sent denial letter to [employer] at fax #: [phone]. And sent denial letter for scanning.

## 2019-12-22 NOTE — Progress Notes (Signed)
Vyepti infusion approval fax received- approval valid from 12/10/19 to 12/08/20 for 1200 units total. Ref #: [id number]

## 2019-12-25 ENCOUNTER — Other Ambulatory Visit: Payer: Self-pay

## 2019-12-25 DIAGNOSIS — M542 Cervicalgia: Secondary | ICD-10-CM

## 2019-12-25 NOTE — Progress Notes (Signed)
Per pt Pt order to [facility] added For Dry Needling

## 2020-01-29 ENCOUNTER — Other Ambulatory Visit: Payer: Self-pay

## 2020-01-29 DIAGNOSIS — M542 Cervicalgia: Secondary | ICD-10-CM

## 2020-01-29 NOTE — Progress Notes (Unsigned)
Per pt request pain management referral sent to [facility]: DR. Brandy Hale

## 2020-01-30 ENCOUNTER — Telehealth: Payer: Self-pay

## 2020-01-30 NOTE — Telephone Encounter (Signed)
Martha Aguilar with [facility] calling to inform us that they do not accept Pain management referrals only substance abuse.   Staff message sent to pt, Advise pt that the referral was denied, and If you would like [name] to send out another referral please give [name] a call. Or try to see your current pain management provider can send somewhere or transfer to another provider in the office.

## 2020-02-04 ENCOUNTER — Other Ambulatory Visit: Payer: Self-pay

## 2020-02-04 DIAGNOSIS — M542 Cervicalgia: Secondary | ICD-10-CM

## 2020-02-04 NOTE — Progress Notes (Signed)
Per pt,request for pain mangement sent to [facility]  [phone]

## 2020-02-10 ENCOUNTER — Encounter: Payer: Self-pay | Admitting: Neurology

## 2020-02-10 ENCOUNTER — Telehealth: Payer: Self-pay | Admitting: Neurology

## 2020-02-10 MED ORDER — NURTEC 75 MG PO TBDP: ORAL_TABLET | ORAL | 4 refills | Status: DC

## 2020-02-10 NOTE — Telephone Encounter (Signed)
Patient called in stating [employer] has a PA for the  Vyetei 100 mg, but they don't have one for the 300 mg. Could and order for the 300mg  be put in? She said they did approve the Nurtec, so a refill needs to be sent in for that.

## 2020-02-10 NOTE — Progress Notes (Signed)
Nurtec approval valid from 02/06/20 to 02/04/21.

## 2020-02-10 NOTE — Telephone Encounter (Signed)
Chelsea when you get a chance can you start a PA for Vypeti.   Nurtec refill sent to pharmacy

## 2020-02-12 ENCOUNTER — Other Ambulatory Visit: Payer: Self-pay | Admitting: Neurology

## 2020-02-12 ENCOUNTER — Other Ambulatory Visit: Payer: Self-pay

## 2020-02-12 MED ORDER — TIZANIDINE HCL 4 MG PO TABS: 4.0000 mg | ORAL_TABLET | Freq: Four times a day (QID) | ORAL | 0 refills | Status: DC | PRN

## 2020-02-13 ENCOUNTER — Encounter: Payer: Self-pay | Admitting: Neurology

## 2020-02-13 NOTE — Progress Notes (Signed)
Per phone call with [employer] Rep Auth already on file for the [facility] PA. Auth #: [id number] valid from 02/02/20 to 01/31/21.

## 2020-03-10 ENCOUNTER — Other Ambulatory Visit: Payer: Self-pay | Admitting: Neurology

## 2020-05-13 ENCOUNTER — Other Ambulatory Visit: Payer: Self-pay | Admitting: Neurology

## 2020-05-13 DIAGNOSIS — M792 Neuralgia and neuritis, unspecified: Secondary | ICD-10-CM

## 2020-05-13 DIAGNOSIS — M5481 Occipital neuralgia: Secondary | ICD-10-CM

## 2020-05-13 NOTE — Telephone Encounter (Signed)
Enough given only till appt with Dr.Adam jaffe on 06/08/2020

## 2020-05-21 ENCOUNTER — Other Ambulatory Visit: Payer: Self-pay | Admitting: Neurology

## 2020-05-21 MED ORDER — QULIPTA 60 MG PO TABS: 60.0000 mg | ORAL_TABLET | Freq: Every day | ORAL | 5 refills | Status: DC

## 2020-06-04 ENCOUNTER — Ambulatory Visit: Payer: BLUE CROSS/BLUE SHIELD | Admitting: Neurology

## 2020-06-04 NOTE — Progress Notes (Deleted)
NEUROLOGY FOLLOW UP OFFICE NOTE  Martha Aguilar [id number]  Assessment/Plan:   1.  Chronic migraine without aura, without status migrainosus, not intractable 2.  Bilateral occipital neuralgia  1.  Migraine prevention:  *** 2.  Migraine rescue:  *** 3.  ***  Subjective:  Martha Aguilar is a50year old right-handed woman who follows up for migraine and occipital neuralgia.  UPDATE: Did not respond to Vyepti.  Started Lenoria Chime a month ago.  ***   Intensity:Moderate to severe Duration:1 day Frequency:4 to 5 a week Current NSAIDS:None  Rescue therapy: Sumatriptan with Nurtec; sumatriptan Elida if wakes up with migraine. Current analgesics:none Current triptans:Sumatriptan 100mg (gradual onset during day), sumatriptan 6mg  [place](if wakes up with it), Tosymra NS Current ergotamine:None Current anti-emetic:Zofran ODT 4mg  Current muscle relaxants:tizanidine 4mg  Q6 hrs PRN Current anti-anxiolytic:alprazolam Current sleep aide:alprazolam Current Antihypertensive medications:None Current Antidepressant medications:None Current Anticonvulsant medications: gabapentin 600mg  three times daily (neuralgia) Current anti-CGRP:Qulipta 60mg  QD Current Vitamins/Herbal/Supplements:Magnesium, B12, D, C, riboflavin, CoQ10, butterbur, alpha-lipoic acid Current Antihistamines/Decongestants:None Other therapy:PT neck pain, ketamine infusions *** Hormone/birth control: Estrogen  Caffeine:No coffee. 1 Coke a day Diet:Drinks 24 to 36 oz water daily Depression:yes; Anxiety:yes Other pain:No Sleep hygiene:better  HISTORY: Onset: 50 years old Location:Middle of head,frontal Quality:Pounding, pressure Initial intensity:Severe.Shedenies new headache, thunderclap headache Aura:no Prodrome:no Postdrome:no Associated symptoms: Photophobia, phonophobia.Shedenies associated nausea, vomiting, visual disturbance/aura, autonomic  symptoms,unilateral numbness or weakness. InitialDuration:Within 3 hours with sumatriptan injection InitialFrequency:Once a week InitialFrequency of abortive medication:every other day to every 2 days Triggers: Wine, change in weather, fatigue Exacerbating factors: movement Relieving factors: Laying in darkand quiet room. Activity:aggravates  She has associated neck painthat started on March 2019. She also has bilateral occipital neuralgia described as burning sensation in back of head. She underwent decompression excision of occipital nerves.  MRI of brain with and without contrast from 04/20/17 personally reviewed and was unremarkable. MRI of cervical spine without contrast from 04/20/17 also personally reviewed and demonstrated unremarkable small disc protrusions at C5-6 and C6-7 but no explanation for headaches. She was evaluated at [facility] and underwent a lumbar puncture on 07/11/17 which demonstrated an opening and closing pressure of 15 cm H2O.   Past NSAIDS:Ibuprofen, naproxen, Toradol Past steroid: Prednisone Past analgesics:Excedrin, tramadol, Norco, Percocet, Fioricet, Lidocaine 5% patch Past abortive triptans:Sumatriptan tablet/NS/injection, Relpax, Zomig tablet Other past abortive: Reyvow (ineffective) Past abortive ergotamine:DHE capsule, DHE injection Past muscle relaxants:Flexeril, tizanidine, baclofen Past anti-emetic:Reglan, Zofran, Compazine Past antihypertensive medications:Atenolol, propranolol, lisinopril, candesartan, HCTZ Past antidepressant medications:Venlafaxine, amitriptyline, nortriptyline, Cymbalta 60mg  daily; Pristiq, Wellbutrin Past anticonvulsant medications:Topiramate, Qudexy,Tegretol,zonisamide, Depakote, gabapentin,Oxtellar XR 600mg  twice daily(rash), oxcarbazepine, Dilantin,Keppra 1000mg  at bedtime, Lyrica 100mg  three times daily,Lamictal 25mg  at bedtime Past anti-CGRP:Aimovig,Ubrelvy, Ajovy, Emgality, Nurtec,  Vyepti 300mg  Past vitamins/Herbal/Supplements:Feverfew Past antihistamines/decongestants:none Other past therapies:Botox,greater/lesser occipital nerve blocks/auriculotemporal nerve block/supraorbital nerve block,stem cell therapy,memantine  Family history of headache:Mom had migraines when younger  PAST MEDICAL HISTORY: Past Medical History:  Diagnosis Date  . Headache(784.0)    migraines  . Hypothyroidism   . Migraines   . Occipital neuralgia     MEDICATIONS: Current Outpatient Medications on File Prior to Visit  Medication Sig Dispense Refill  . Alpha-Lipoic Acid 300 MG CAPS Take 300 mg by mouth 3 (three) times daily.    Marland Kitchen ALPRAZolam (XANAX) 1 MG tablet Take 1 mg by mouth 2 (two) times daily as needed.   5  . Atogepant (QULIPTA) 60 MG TABS Take 60 mg by mouth daily. 30 tablet 5  . baclofen (LIORESAL) 10 MG tablet Take 10 mg by  mouth 3 (three) times daily.     Martha Aguilar (BOSWELLIA PO) Take by mouth daily. 2 TABS DAILY    . butalbital-acetaminophen-caffeine (FIORICET) 50-325-40 MG tablet TAKE (1) TABLET BY MOUTH EVERY SIX HOURS AS NEEDED. MAX 10 PER MONTH 10 tablet 2  . cephALEXin (KEFLEX) 500 MG capsule Take 500 mg by mouth 4 (four) times daily.    . Cholecalciferol (VITAMIN D3) 50 MCG (2000 UT) TABS Take by mouth daily.    . fluticasone (CUTIVATE) 0.05 % cream fluticasone propionate 0.05 % topical cream    . gabapentin (NEURONTIN) 800 MG tablet TAKE 1 TABLET BY MOUTH 3 TIMES DAILY. 78 tablet 0  . HYDROcodone-acetaminophen (NORCO/VICODIN) 5-325 MG tablet Take 1 tablet by mouth every 6 (six) hours as needed for moderate pain.    Marland Kitchen levothyroxine (SYNTHROID, LEVOTHROID) 150 MCG tablet Take 150 mcg by mouth daily before breakfast.    . MAGNESIUM PO Take 2,000 mg by mouth daily.    . Melatonin 12 MG TABS Take by mouth daily.    . Multiple Vitamin (MULTIVITAMIN WITH MINERALS) TABS tablet Take 1 tablet by mouth at bedtime.    . Niacin (VITAMIN B-3 PO) Take 500 mg  by mouth daily.    . NON FORMULARY 2,100 mg daily. [facility]    . Omega-3 Fatty Acids (FISH OIL) 1000 MG CAPS Take by mouth. 2 tabs daily    . ondansetron (ZOFRAN) 8 MG tablet TAKE (1) TABLET BY MOUTH EVERY FOUR HOURS AS NEEDED. 10 tablet 5  . phenazopyridine (PYRIDIUM) 200 MG tablet phenazopyridine 200 mg tablet  TAKE 1 TABLET BY MOUTH THREE TIMES A DAY AS NEEDED FOR PAIN    . PREMARIN 0.3 MG tablet daily.    . progesterone (PROMETRIUM) 100 MG capsule daily.    . Rimegepant Sulfate (NURTEC) 75 MG TBDP TAKE 1 TABLET DAILY IF NEEDED. MAX 1 TABLET PER DAY. 8 tablet 4  . SUMAtriptan (TOSYMRA) 10 MG/ACT SOLN Place 10 mg into the nose every hour as needed (Maximum 3 sprays in 24 hours.). 6 each 5  . SUMAtriptan 6 MG/0.5ML SOAJ Inject 6 mg into the skin as needed (Migraine).     Marland Kitchen tiZANidine (ZANAFLEX) 4 MG tablet Take 1 tablet (4 mg total) by mouth every 6 (six) hours as needed. for pain 30 tablet 0  . valACYclovir (VALTREX) 1000 MG tablet Take 1 tablet (1,000 mg total) by mouth 2 (two) times daily. 20 tablet 0  . vitamin C (ASCORBIC ACID) 500 MG tablet Take 500 mg by mouth at bedtime.    Marland Kitchen zolpidem (AMBIEN) 10 MG tablet zolpidem 10 mg tablet     No current facility-administered medications on file prior to visit.    ALLERGIES: Allergies  Allergen Reactions  . Aimovig [Erenumab] Other (See Comments)    Burning in the back of the head, severe migraine  . Other Rash    Oxtellar  Oxcarbazepine   . Carbamazepine   . Compazine [Prochlorperazine] Other (See Comments)    IV form, can take PO    FAMILY HISTORY: Family History  Problem Relation Age of Onset  . Migraines Mother        benign small bowel tumor  . Prostate cancer Father   . Seizures Brother        MVA  . Migraines Other       Objective:  *** General: No acute distress.  Patient appears ***-groomed.   Head:  Normocephalic/atraumatic Eyes:  Fundi examined but not visualized Neck:  supple, no paraspinal tenderness,  full range of motion Heart:  Regular rate and rhythm Lungs:  Clear to auscultation bilaterally Back: No paraspinal tenderness Neurological Exam: alert and oriented to person, place, and time. Attention span and concentration intact, recent and remote memory intact, fund of knowledge intact.  Speech fluent and not dysarthric, language intact.  CN II-XII intact. Bulk and tone normal, muscle strength 5/5 throughout.  Sensation to light touch, temperature and vibration intact.  Deep tendon reflexes 2+ throughout, toes downgoing.  Finger to nose and heel to shin testing intact.  Gait normal, Romberg negative.     Metta Clines, DO  CC: Stevan Born, FNP

## 2020-06-08 ENCOUNTER — Ambulatory Visit: Payer: BC Managed Care – PPO | Admitting: Neurology

## 2020-06-09 ENCOUNTER — Encounter: Payer: Self-pay | Admitting: Neurology

## 2020-06-14 ENCOUNTER — Other Ambulatory Visit: Payer: Self-pay | Admitting: Neurology

## 2020-06-14 ENCOUNTER — Telehealth: Payer: Self-pay | Admitting: Neurology

## 2020-06-14 DIAGNOSIS — M5481 Occipital neuralgia: Secondary | ICD-10-CM

## 2020-06-14 DIAGNOSIS — M792 Neuralgia and neuritis, unspecified: Secondary | ICD-10-CM

## 2020-06-14 NOTE — Telephone Encounter (Signed)
Patient dismissed from [facility] by Metta Clines, DO, effective 06/09/20. Dismissal Letter sent out by 1st class mail. [employer]

## 2020-06-15 ENCOUNTER — Other Ambulatory Visit: Payer: Self-pay

## 2020-06-15 DIAGNOSIS — M5481 Occipital neuralgia: Secondary | ICD-10-CM

## 2020-06-15 DIAGNOSIS — M792 Neuralgia and neuritis, unspecified: Secondary | ICD-10-CM

## 2020-06-15 MED ORDER — GABAPENTIN 800 MG PO TABS: 800.0000 mg | ORAL_TABLET | Freq: Three times a day (TID) | ORAL | 0 refills | Status: AC

## 2020-06-16 ENCOUNTER — Telehealth: Payer: Self-pay | Admitting: Neurology

## 2020-06-16 NOTE — Telephone Encounter (Signed)
Martha Aguilar placed a my chart message,medication sent to pharmacy.

## 2020-06-16 NOTE — Telephone Encounter (Signed)
Called upset that she was discharged from the practice. Said she never recd a Agricultural consultant from [name]. Wants a refill on gabapentin, and wants Jaffe to continue refills till she is able to get into another clinic.

## 2020-06-17 NOTE — Telephone Encounter (Signed)
Patient came in wanting to get a Referral sent for her to [facility]

## 2020-06-17 NOTE — Telephone Encounter (Signed)
Pt advised per Dr.Jaffe, It should come from PCP for the referral.

## 2020-06-28 ENCOUNTER — Telehealth: Payer: Self-pay | Admitting: Neurology

## 2020-06-28 NOTE — Telephone Encounter (Signed)
error 

## 2020-08-25 DIAGNOSIS — I773 Arterial fibromuscular dysplasia: Secondary | ICD-10-CM | POA: Insufficient documentation

## 2020-08-25 HISTORY — DX: Arterial fibromuscular dysplasia: I77.3

## 2020-09-01 ENCOUNTER — Other Ambulatory Visit: Payer: Self-pay | Admitting: Neurology

## 2020-09-07 ENCOUNTER — Emergency Department (HOSPITAL_COMMUNITY)
Admission: EM | Admit: 2020-09-07 | Discharge: 2020-09-07 | Disposition: A | Discharge status: Home / Self Care | Payer: BC Managed Care – PPO | Attending: Emergency Medicine | Admitting: Emergency Medicine

## 2020-09-07 ENCOUNTER — Emergency Department (HOSPITAL_COMMUNITY): Payer: BC Managed Care – PPO

## 2020-09-07 ENCOUNTER — Other Ambulatory Visit: Payer: Self-pay

## 2020-09-07 ENCOUNTER — Encounter (HOSPITAL_COMMUNITY): Payer: Self-pay | Admitting: *Deleted

## 2020-09-07 DIAGNOSIS — M549 Dorsalgia, unspecified: Secondary | ICD-10-CM | POA: Diagnosis not present

## 2020-09-07 DIAGNOSIS — E039 Hypothyroidism, unspecified: Secondary | ICD-10-CM | POA: Diagnosis not present

## 2020-09-07 DIAGNOSIS — R350 Frequency of micturition: Secondary | ICD-10-CM | POA: Insufficient documentation

## 2020-09-07 DIAGNOSIS — N159 Renal tubulo-interstitial disease, unspecified: Secondary | ICD-10-CM | POA: Insufficient documentation

## 2020-09-07 DIAGNOSIS — R3915 Urgency of urination: Secondary | ICD-10-CM | POA: Insufficient documentation

## 2020-09-07 DIAGNOSIS — R112 Nausea with vomiting, unspecified: Secondary | ICD-10-CM | POA: Insufficient documentation

## 2020-09-07 DIAGNOSIS — R197 Diarrhea, unspecified: Secondary | ICD-10-CM | POA: Diagnosis not present

## 2020-09-07 DIAGNOSIS — R6883 Chills (without fever): Secondary | ICD-10-CM | POA: Insufficient documentation

## 2020-09-07 DIAGNOSIS — Z79899 Other long term (current) drug therapy: Secondary | ICD-10-CM | POA: Insufficient documentation

## 2020-09-07 DIAGNOSIS — R1030 Lower abdominal pain, unspecified: Secondary | ICD-10-CM | POA: Diagnosis not present

## 2020-09-07 DIAGNOSIS — R300 Dysuria: Secondary | ICD-10-CM | POA: Diagnosis present

## 2020-09-07 DIAGNOSIS — N390 Urinary tract infection, site not specified: Secondary | ICD-10-CM

## 2020-09-07 LAB — BASIC METABOLIC PANEL
Anion gap: 5 (ref 5–15)
BUN: 11 mg/dL (ref 6–20)
CO2: 29 mmol/L (ref 22–32)
Calcium: 8.7 mg/dL — ABNORMAL LOW (ref 8.9–10.3)
Chloride: 103 mmol/L (ref 98–111)
Creatinine, Ser: 0.78 mg/dL (ref 0.44–1.00)
GFR, Estimated: >60 mL/min (ref >60)
Glucose, Bld: 96 mg/dL (ref 70–99)
Potassium: 3.9 mmol/L (ref 3.5–5.1)
Sodium: 137 mmol/L (ref 135–145)

## 2020-09-07 LAB — URINALYSIS, ROUTINE W REFLEX MICROSCOPIC
Bilirubin Urine: NEGATIVE
Glucose, UA: NEGATIVE mg/dL
Hgb urine dipstick: NEGATIVE
Ketones, ur: NEGATIVE mg/dL
Leukocytes,Ua: NEGATIVE
Nitrite: POSITIVE — AB
Protein, ur: NEGATIVE mg/dL
Specific Gravity, Urine: 1.003 — ABNORMAL LOW (ref 1.005–1.030)
pH: 6 (ref 5.0–8.0)

## 2020-09-07 LAB — CBC WITH DIFFERENTIAL/PLATELET
Abs Immature Granulocytes: 0.01 10*3/uL (ref 0.00–0.07)
Basophils Absolute: 0 10*3/uL (ref 0.0–0.1)
Basophils Relative: 1 %
Eosinophils Absolute: 0.1 10*3/uL (ref 0.0–0.5)
Eosinophils Relative: 2 %
HCT: 43.8 % (ref 36.0–46.0)
Hemoglobin: 14.4 g/dL (ref 12.0–15.0)
Immature Granulocytes: 0 %
Lymphocytes Relative: 44 %
Lymphs Abs: 1.7 10*3/uL (ref 0.7–4.0)
MCH: 31.7 pg (ref 26.0–34.0)
MCHC: 32.9 g/dL (ref 30.0–36.0)
MCV: 96.5 fL (ref 80.0–100.0)
Monocytes Absolute: 0.4 10*3/uL (ref 0.1–1.0)
Monocytes Relative: 10 %
Neutro Abs: 1.7 10*3/uL (ref 1.7–7.7)
Neutrophils Relative %: 43 %
Platelets: 206 10*3/uL (ref 150–400)
RBC: 4.54 MIL/uL (ref 3.87–5.11)
RDW: 12.3 % (ref 11.5–15.5)
WBC: 3.9 10*3/uL — ABNORMAL LOW (ref 4.0–10.5)
nRBC: 0 % (ref 0.0–0.2)

## 2020-09-07 LAB — PREGNANCY, URINE: Preg Test, Ur: NEGATIVE

## 2020-09-07 MED ORDER — OXYCODONE-ACETAMINOPHEN 5-325 MG PO TABS
1.0000 | ORAL_TABLET | Freq: Once | ORAL | Status: AC
  Administered 2020-09-07: 1 via ORAL
  Filled 2020-09-07: qty 1

## 2020-09-07 NOTE — Discharge Instructions (Addendum)
[facility] to the [facility] today to be evaluated for your urinary symptoms.  Your lab work and ultrasound imaging were reassuring.  Please continue to take your prescribed antibiotics.  Please follow-up with your primary care provider after completion of your antibiotics.  Get help right away if: You have severe pain in your back or your lower abdomen. You have a fever or chills. You have nausea or vomiting.

## 2020-09-07 NOTE — ED Triage Notes (Signed)
Possible kidney infection. Treated for UTI last week without success

## 2020-09-07 NOTE — ED Provider Notes (Addendum)
[facility] Provider Note   CSN: [id number] Arrival date & time: 09/07/20  1553     History No chief complaint on file.   Martha Aguilar is a 50 y.o. female with a history of hypothyroidism and migraines.  Presents emergency department with a chief complaint of dysuria, urinary frequency, urinary urgency, suprapubic pain and back pain.  Patient reports that symptoms have been present since 8/24.  Symptoms have been constant over this time.  Patient rates her back pain at 8/10 on the pain scale.  No aggravating factors.  Patient has had minimal relief with over-the-counter pain medication.  Patient reports he did do some heavy lifting at home prior to back pain starting.   Patient states that she took leftover antibiotics pills of Cipro and Augmentin.  Patient went to urgent care on Saturday and was diagnosed with urinary tract infection.  Received IM ceftriaxone and was started on Ceftin.  Patient went to her primary care provider today due to continued symptoms urinalysis showed nitrite positive, leukocytes negative.  Patient was told to come to the emergency department for possible pyelonephritis.  Patient endorses nausea, vomiting, diarrhea, chills.  Patient vomited once in the last 24 hours.  Emesis was described as stomach contents.    Patient denies any vaginal pain, vaginal bleeding, vaginal discharge, genital sores or lesions, hematuria, fever, abdominal pain, rhinorrhea, nasal congestion, cough.  Patient is sexually active in a mutually monogamous relationship with a female partner.  Patient reports she has not had sexual intercourse in the last 3 months.  Patient reports she is currently going through menopause.  HPI     Past Medical History:  Diagnosis Date   Headache(784.0)    migraines   Hypothyroidism    Migraines    Occipital neuralgia     Patient Active Problem List   Diagnosis Date Noted   Bilateral occipital neuralgia 11/28/2018    Neuropathic pain 11/28/2018   Constipation 09/05/2018   Abdominal bloating 09/05/2018   Right upper quadrant pain 06/06/2018   Chronic migraine w/o aura w/o status migrainosus, not intractable 08/22/2015   Abdominal pain, epigastric 07/03/2013   Acute kidney injury ([place]) 10/20/2011   Nausea & vomiting 10/19/2011   Suprapubic abdominal pain 10/19/2011   Dehydration 10/19/2011   Hypothyroidism 10/19/2011   Migraine 10/19/2011    Past Surgical History:  Procedure Laterality Date   BIOPSY  10/23/2018   Procedure: BIOPSY;  Surgeon: Rogene Houston, MD;  Location: [facility];  Service: Endoscopy;;   COLONOSCOPY N/A 10/23/2018   Procedure: COLONOSCOPY;  Surgeon: Rogene Houston, MD;  Location: [facility];  Service: Endoscopy;  Laterality: N/A;  1:00   ESOPHAGOGASTRODUODENOSCOPY (EGD) WITH PROPOFOL N/A 06/13/2018   Procedure: ESOPHAGOGASTRODUODENOSCOPY (EGD) WITH PROPOFOL;  Surgeon: Rogene Houston, MD;  Location: [facility];  Service: Endoscopy;  Laterality: N/A;   POLYPECTOMY  10/23/2018   Procedure: POLYPECTOMY;  Surgeon: Rogene Houston, MD;  Location: [facility];  Service: Endoscopy;;   TUBAL LIGATION     uterine ablation     uterine polyps       OB History     Gravida      Para      Term      Preterm      AB      Living  0      SAB      IAB      Ectopic      Multiple  Live Births              Family History  Problem Relation Age of Onset   Migraines Mother        benign small bowel tumor   Prostate cancer Father    Seizures Brother        MVA   Migraines Other     Social History   Tobacco Use   Smoking status: Never   Smokeless tobacco: Never  Vaping Use   Vaping Use: Never used  Substance Use Topics   Alcohol use: Yes    Comment: social   Drug use: No    Home Medications Prior to Admission medications   Medication Sig Start Date End Date Taking? Authorizing Provider  Alpha-Lipoic Acid 300 MG CAPS Take 300 mg by  mouth 3 (three) times daily.    [provider]  ALPRAZolam Duanne Moron) 1 MG tablet Take 1 mg by mouth 2 (two) times daily as needed.  05/01/16   [provider]  Atogepant (QULIPTA) 60 MG TABS Take 60 mg by mouth daily. 05/21/20   Pieter Partridge, DO  baclofen (LIORESAL) 10 MG tablet Take 10 mg by mouth 3 (three) times daily.  08/09/18   [provider]  Boswellia Serrata (BOSWELLIA PO) Take by mouth daily. 2 TABS DAILY    [provider]  butalbital-acetaminophen-caffeine (FIORICET) 50-325-40 MG tablet TAKE (1) TABLET BY MOUTH EVERY SIX HOURS AS NEEDED. MAX 10 PER MONTH 03/10/20   Tomi Likens, Adam R, DO  cephALEXin (KEFLEX) 500 MG capsule Take 500 mg by mouth 4 (four) times daily. 03/24/19   [provider]  Cholecalciferol (VITAMIN D3) 50 MCG (2000 UT) TABS Take by mouth daily.    [provider]  fluticasone (CUTIVATE) 0.05 % cream fluticasone propionate 0.05 % topical cream 02/28/19   [provider]  gabapentin (NEURONTIN) 800 MG tablet Take 1 tablet (800 mg total) by mouth 3 (three) times daily. 06/15/20   Pieter Partridge, DO  HYDROcodone-acetaminophen (NORCO/VICODIN) 5-325 MG tablet Take 1 tablet by mouth every 6 (six) hours as needed for moderate pain.    [provider]  levothyroxine (SYNTHROID, LEVOTHROID) 150 MCG tablet Take 150 mcg by mouth daily before breakfast.    [provider]  MAGNESIUM PO Take 2,000 mg by mouth daily.    [provider]  Melatonin 12 MG TABS Take by mouth daily.    [provider]  Multiple Vitamin (MULTIVITAMIN WITH MINERALS) TABS tablet Take 1 tablet by mouth at bedtime.    [provider]  Niacin (VITAMIN B-3 PO) Take 500 mg by mouth daily.    [provider]  NON FORMULARY 2,100 mg daily. [facility]    [provider]  Omega-3 Fatty Acids (FISH OIL) 1000 MG CAPS Take by mouth. 2 tabs daily    [provider]  ondansetron (ZOFRAN) 8 MG tablet  TAKE (1) TABLET BY MOUTH EVERY FOUR HOURS AS NEEDED. 12/09/19   Tomi Likens, Adam R, DO  phenazopyridine (PYRIDIUM) 200 MG tablet phenazopyridine 200 mg tablet  TAKE 1 TABLET BY MOUTH THREE TIMES A DAY AS NEEDED FOR PAIN 03/21/19   [provider]  PREMARIN 0.3 MG tablet daily. 08/24/18   [provider]  progesterone (PROMETRIUM) 100 MG capsule daily. 08/24/18   [provider]  Rimegepant Sulfate (NURTEC) 75 MG TBDP TAKE 1 TABLET DAILY IF NEEDED. MAX 1 TABLET PER DAY. 02/10/20   Tomi Likens, Adam R, DO  SUMAtriptan [facility])  10 MG/ACT SOLN Place 10 mg into the nose every hour as needed (Maximum 3 sprays in 24 hours.). 12/09/19   Pieter Partridge, DO  SUMAtriptan 6 MG/0.5ML SOAJ Inject 6 mg into the skin as needed (Migraine).  07/19/18   [provider]  tiZANidine (ZANAFLEX) 4 MG tablet Take 1 tablet (4 mg total) by mouth every 6 (six) hours as needed. for pain 02/12/20   Pieter Partridge, DO  valACYclovir (VALTREX) 1000 MG tablet Take 1 tablet (1,000 mg total) by mouth 2 (two) times daily. 05/09/17   Cresenzo-Dishmon, Joaquim Lai, CNM  vitamin C (ASCORBIC ACID) 500 MG tablet Take 500 mg by mouth at bedtime.    [provider]  zolpidem (AMBIEN) 10 MG tablet zolpidem 10 mg tablet 03/14/19   [provider]    Allergies    Aimovig [erenumab], Other, Carbamazepine, and Compazine [prochlorperazine]  Review of Systems   Review of Systems  Constitutional:  Positive for chills. Negative for fever.  HENT:  Negative for congestion, rhinorrhea and sore throat.   Eyes:  Negative for visual disturbance.  Respiratory:  Negative for cough and shortness of breath.   Cardiovascular:  Negative for chest pain.  Gastrointestinal:  Positive for diarrhea, nausea and vomiting. Negative for abdominal pain, blood in stool and constipation.  Genitourinary:  Positive for dysuria, frequency and urgency. Negative for difficulty urinating, genital sores, hematuria, vaginal bleeding and vaginal  discharge.  Musculoskeletal:  Positive for back pain. Negative for neck pain.  Skin:  Negative for color change and rash.  Neurological:  Negative for dizziness, syncope, light-headedness and headaches.  Psychiatric/Behavioral:  Negative for confusion.    Physical Exam Updated Vital Signs BP 131/76 (BP Location: Left Arm)   Pulse 73   Temp 97.8 F (36.6 C) (Oral)   Resp 16   SpO2 97%   Physical Exam Vitals and nursing note reviewed.  Constitutional:      General: She is not in acute distress.    Appearance: She is not ill-appearing, toxic-appearing or diaphoretic.  HENT:     Head: Normocephalic.  Eyes:     General: No scleral icterus.       Right eye: No discharge.        Left eye: No discharge.  Cardiovascular:     Rate and Rhythm: Normal rate.  Pulmonary:     Effort: Pulmonary effort is normal. No tachypnea, bradypnea or respiratory distress.     Breath sounds: Normal breath sounds. No stridor.  Abdominal:     General: Abdomen is flat. There is no distension. There are no signs of injury.     Palpations: Abdomen is soft. There is no mass or pulsatile mass.     Tenderness: There is no abdominal tenderness. There is right CVA tenderness and left CVA tenderness. There is no guarding or rebound.     Hernia: There is no hernia in the umbilical area or ventral area.  Musculoskeletal:     Comments: No midline tenderness or deformity to cervical, thoracic, or lumbar spine.  Skin:    General: Skin is warm and dry.     Coloration: Skin is not jaundiced or pale.  Neurological:     General: No focal deficit present.     Mental Status: She is alert.  Psychiatric:        Behavior: Behavior is cooperative.    ED Results / Procedures / Treatments   Labs (all labs ordered are listed, but only abnormal results are displayed) Labs  Reviewed  URINALYSIS, ROUTINE W REFLEX MICROSCOPIC - Abnormal; Notable for the following components:      Result Value   Specific Gravity, Urine 1.003  (*)    Nitrite POSITIVE (*)    Bacteria, UA RARE (*)    All other components within normal limits  BASIC METABOLIC PANEL - Abnormal; Notable for the following components:   Calcium 8.7 (*)    All other components within normal limits  CBC WITH DIFFERENTIAL/PLATELET - Abnormal; Notable for the following components:   WBC 3.9 (*)    All other components within normal limits  URINE CULTURE  PREGNANCY, URINE    EKG None  Radiology US Renal  Result Date: 09/07/2020 CLINICAL DATA:  Kidney infection and fever for 2 days. EXAM: RENAL / URINARY TRACT ULTRASOUND COMPLETE COMPARISON:  None. FINDINGS: Right Kidney: Renal measurements: 10.2 cm x 3.9 cm x 4.1 cm = volume: 83.5 mL. Echogenicity within normal limits. No mass or hydronephrosis visualized. Left Kidney: Renal measurements: 11.3 cm x 4.1 cm x 4.5 cm = volume: 109 mL. Echogenicity within normal limits. No mass or hydronephrosis visualized. Bladder: Appears normal for degree of bladder distention. Other: None. IMPRESSION: Normal renal ultrasound. Electronically Signed   By: Virgina Norfolk M.D.   On: 09/07/2020 18:12    Procedures Procedures   Medications Ordered in ED Medications  oxyCODONE-acetaminophen (PERCOCET/ROXICET) 5-325 MG per tablet 1 tablet (1 tablet Oral Given 09/07/20 1748)    ED Course  I have reviewed the triage vital signs and the nursing notes.  Pertinent labs & imaging results that were available during my care of the patient were reviewed by me and considered in my medical decision making (see chart for details).    MDM Rules/Calculators/A&P                           Alert 50 year old female no acute distress, nontoxic appearing.  Presents to ED with chief complaint of dysuria, urinary frequency, urinary urgency, suprapubic pain, back pain.  Patient was sent to emergency department from her primary care provider for concern for pyelonephritis.  Patient diagnosed with UTI on Saturday, received IM ceftriaxone and  started on Ceftin.  Patient has continued symptoms despite antibiotics.  Will obtain urinalysis, urine culture, urine pregnancy test, basic lab work, and renal ultrasound for further evaluation.  Urine pregnancy test negative BMP unremarkable CBC shows no leukocytosis or anemia. Urinalysis shows bacteria rare, WBC 0-5, RBC 0-5, set negative, nitrite positive. US renal study unremarkable. Urine culture pending at this time.  Patient does not meet criteria for inpatient admission at this time.  We will have patient continue Ceftin antibiotics.  Patient to follow-up with primary care provider.  Discussed results, findings, treatment and follow up. Patient advised of return precautions. Patient verbalized understanding and agreed with plan.   Final Clinical Impression(s) / ED Diagnoses Final diagnoses:  None    Rx / DC Orders ED Discharge Orders     None        Loni Beckwith, PA-C 09/07/20 1845    Loni Beckwith, PA-C 09/07/20 1845    Sherwood Gambler, MD 09/12/20 [phone]

## 2020-09-14 DIAGNOSIS — N3010 Interstitial cystitis (chronic) without hematuria: Secondary | ICD-10-CM

## 2020-09-14 HISTORY — DX: Interstitial cystitis (chronic) without hematuria: N30.10

## 2020-09-14 LAB — URINE CULTURE: Culture: <10000 — AB

## 2020-09-20 ENCOUNTER — Other Ambulatory Visit: Payer: Self-pay | Admitting: Neurology

## 2020-10-27 ENCOUNTER — Ambulatory Visit: Payer: BC Managed Care – PPO | Admitting: Neurology

## 2020-11-08 ENCOUNTER — Encounter: Payer: Self-pay | Admitting: Urology

## 2020-11-08 ENCOUNTER — Ambulatory Visit (INDEPENDENT_AMBULATORY_CARE_PROVIDER_SITE_OTHER): Payer: BC Managed Care – PPO | Admitting: Urology

## 2020-11-08 ENCOUNTER — Other Ambulatory Visit: Payer: Self-pay

## 2020-11-08 VITALS — BP 111/75 | HR 105 | Ht 66.0 in | Wt 140.0 lb

## 2020-11-08 DIAGNOSIS — N3020 Other chronic cystitis without hematuria: Secondary | ICD-10-CM | POA: Diagnosis not present

## 2020-11-08 DIAGNOSIS — R1030 Lower abdominal pain, unspecified: Secondary | ICD-10-CM | POA: Diagnosis not present

## 2020-11-08 DIAGNOSIS — N3010 Interstitial cystitis (chronic) without hematuria: Secondary | ICD-10-CM | POA: Diagnosis not present

## 2020-11-08 MED ORDER — MELOXICAM 15 MG PO TABS: 15.0000 mg | ORAL_TABLET | Freq: Every day | ORAL | 1 refills | Status: DC

## 2020-11-08 MED ORDER — NITROFURANTOIN MACROCRYSTAL 100 MG PO CAPS: 100.0000 mg | ORAL_CAPSULE | Freq: Every day | ORAL | 11 refills | Status: DC

## 2020-11-08 NOTE — Progress Notes (Signed)
11/08/2020 9:58 AM   [employer] Douglass Rivers 12/22/1970 [id number]  Referring provider: Louretta Shorten, MD [address] [place],  [place] [address]  Chief Complaint  Patient presents with   Cystitis    HPI: I was consulted to assess the patient's suprapubic and back pain.  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  She self treated herself with an antibiotic and then went on to have Rocephin and then Keflex.  She describes a normal renal ultrasound in the emergency room and had a tentative diagnosis of pyelonephritis.  A CT scan was not performed based on insurance apparently.  She then took prednisone and muscle relaxants that helped the suprapubic and back pain quickly but her symptoms have recurred.  She does report dyspareunia  She normally gets recurrent bladder infections with a scratchy feeling in the bladder with urgency and frequency and back pain that respond favorably to antibiotics.  She has not had bladder infections for a few years but they generally respond favorably to antibiotics.  She voids every 1 hour and cannot hold it for 2 hours.  She is getting up once or twice a night.  Her flow was poor.  She hesitates.  She often feels empty but soon after asked to urinate.  She is taking Pyridium 4 times a day.  She still has a suprapubic discomfort like a vice grip.  Oxybutynin she currently takes and is not helping  She has not had a hysterectomy.  She has had uterine ablation she is irritable bowel syndrome with alternating constipation and diarrhea.  Her gynecologist thought that she might have interstitial cystitis.  She has a history of migraine  Reviewed medical record.  Normal renal ultrasound and a negative culture  No neurologic issues.   PMH: Past Medical History:  Diagnosis Date   Headache(784.0)    migraines   Hypothyroidism    Migraines    Occipital neuralgia      Surgical History: Past Surgical History:  Procedure Laterality Date   BIOPSY  10/23/2018   Procedure: BIOPSY;  Surgeon: Rogene Houston, MD;  [facility];  Service: Endoscopy;;   COLONOSCOPY N/A 10/23/2018   Procedure: COLONOSCOPY;  Surgeon: Rogene Houston, MD;  [facility];  Service: Endoscopy;  Laterality: N/A;  1:00   ESOPHAGOGASTRODUODENOSCOPY (EGD) WITH PROPOFOL N/A 06/13/2018   Procedure: ESOPHAGOGASTRODUODENOSCOPY (EGD) WITH PROPOFOL;  Surgeon: Rogene Houston, MD;  [facility];  Service: Endoscopy;  Laterality: N/A;   POLYPECTOMY  10/23/2018   Procedure: POLYPECTOMY;  Surgeon: Rogene Houston, MD;  [facility];  Service: Endoscopy;;   TUBAL LIGATION     uterine ablation     uterine polyps      Home Medications:  Allergies as of 11/08/2020       Reactions   Aimovig [erenumab] Other (See Comments)   Burning in the back of the head, severe migraine   Other Rash   Oxtellar Oxcarbazepine    Carbamazepine    Compazine [prochlorperazine] Other (See Comments)   IV form, can take PO        Medication List        Accurate as of November 08, 2020  9:58 AM. If you have any questions, ask your nurse or doctor.          Alpha-Lipoic Acid 300 MG Caps Take 300  mg by mouth 3 (three) times daily.   ALPRAZolam 1 MG tablet Commonly known as: XANAX Take 1 mg by mouth 2 (two) times daily as needed.   baclofen 10 MG tablet Commonly known as: LIORESAL Take 10 mg by mouth 3 (three) times daily.   BOSWELLIA PO Take by mouth daily. 2 TABS DAILY   butalbital-acetaminophen-caffeine 50-325-40 MG tablet Commonly known as: FIORICET TAKE (1) TABLET BY MOUTH EVERY SIX HOURS AS NEEDED. MAX 10 PER MONTH   cephALEXin 500 MG capsule Commonly known as: KEFLEX Take 500 mg by mouth 4 (four) times daily.   Fish Oil 1000 MG Caps Take by mouth. 2 tabs daily   fluticasone 0.05 % cream Commonly known as: CUTIVATE fluticasone  propionate 0.05 % topical cream   gabapentin 800 MG tablet Commonly known as: NEURONTIN Take 1 tablet (800 mg total) by mouth 3 (three) times daily.   HYDROcodone-acetaminophen 5-325 MG tablet Commonly known as: NORCO/VICODIN Take 1 tablet by mouth every 6 (six) hours as needed for moderate pain.   levothyroxine 150 MCG tablet Commonly known as: SYNTHROID Take 150 mcg by mouth daily before breakfast.   MAGNESIUM PO Take 2,000 mg by mouth daily.   Melatonin 12 MG Tabs Take by mouth daily.   multivitamin with minerals Tabs tablet Take 1 tablet by mouth at bedtime.   NON FORMULARY 2,100 mg daily. [place]   Nurtec 75 MG Tbdp Generic drug: Rimegepant Sulfate TAKE 1 TABLET DAILY IF NEEDED. MAX 1 TABLET PER DAY.   ondansetron 8 MG tablet Commonly known as: ZOFRAN TAKE (1) TABLET BY MOUTH EVERY FOUR HOURS AS NEEDED.   phenazopyridine 200 MG tablet Commonly known as: PYRIDIUM phenazopyridine 200 mg tablet  TAKE 1 TABLET BY MOUTH THREE TIMES A DAY AS NEEDED FOR PAIN   Premarin 0.3 MG tablet Generic drug: estrogens (conjugated) daily.   progesterone 100 MG capsule Commonly known as: PROMETRIUM daily.   Qulipta 60 MG Tabs Generic drug: Atogepant Take 60 mg by mouth daily.   SUMAtriptan 6 MG/0.5ML Soaj Inject 6 mg into the skin as needed (Migraine).   tiZANidine 4 MG tablet Commonly known as: ZANAFLEX Take 1 tablet (4 mg total) by mouth every 6 (six) hours as needed. for pain   Tosymra 10 MG/ACT Soln Generic drug: SUMAtriptan Place 10 mg into the nose every hour as needed (Maximum 3 sprays in 24 hours.).   valACYclovir 1000 MG tablet Commonly known as: VALTREX Take 1 tablet (1,000 mg total) by mouth 2 (two) times daily.   VITAMIN B-3 PO Take 500 mg by mouth daily.   vitamin C 500 MG tablet Commonly known as: ASCORBIC ACID Take 500 mg by mouth at bedtime.   Vitamin D3 50 MCG (2000 UT) Tabs Take by mouth daily.   zolpidem 10 MG tablet Commonly known  as: AMBIEN zolpidem 10 mg tablet        Allergies:  Allergies  Allergen Reactions   Aimovig [Erenumab] Other (See Comments)    Burning in the back of the head, severe migraine   Other Rash    Oxtellar  Oxcarbazepine    Carbamazepine    Compazine [Prochlorperazine] Other (See Comments)    IV form, can take PO    Family History: Family History  Problem Relation Age of Onset   Migraines Mother        benign small bowel tumor   Prostate cancer Father    Seizures Brother        MVA  Migraines Other     Social History:  reports that she has never smoked. She has never used smokeless tobacco. She reports current alcohol use. She reports that she does not use drugs.  ROS:                                        Physical Exam: There were no vitals taken for this visit.  Constitutional:  Alert and oriented, No acute distress. HEENT: [place] AT, moist mucus membranes.  Trachea midline, no masses. Cardiovascular: No clubbing, cyanosis, or edema. Respiratory: Normal respiratory effort, no increased work of breathing. GI: Abdomen is soft, nontender, nondistended, no abdominal masses GU: Mild hypermobility the bladder neck.  Bladder was a bit sensitive to touch.  No levator tenderness.  Levator muscles not tense. Skin: No rashes, bruises or suspicious lesions. Lymph: No cervical or inguinal adenopathy. Neurologic: Grossly intact, no focal deficits, moving all 4 extremities. Psychiatric: Normal mood and affect.  Laboratory Data: Lab Results  Component Value Date   WBC 3.9 (L) 09/07/2020   HGB 14.4 09/07/2020   HCT 43.8 09/07/2020   MCV 96.5 09/07/2020   PLT 206 09/07/2020    Lab Results  Component Value Date   CREATININE 0.78 09/07/2020    No results found for: PSA  No results found for: TESTOSTERONE  No results found for: HGBA1C  Urinalysis    Component Value Date/Time   COLORURINE YELLOW 09/07/2020 1700   APPEARANCEUR CLEAR 09/07/2020  1700   LABSPEC 1.003 (L) 09/07/2020 1700   PHURINE 6.0 09/07/2020 1700   GLUCOSEU NEGATIVE 09/07/2020 1700   HGBUR NEGATIVE 09/07/2020 1700   BILIRUBINUR NEGATIVE 09/07/2020 1700   KETONESUR NEGATIVE 09/07/2020 1700   PROTEINUR NEGATIVE 09/07/2020 1700   UROBILINOGEN 0.2 10/19/2011 1833   NITRITE POSITIVE (A) 09/07/2020 1700   LEUKOCYTESUR NEGATIVE 09/07/2020 1700    Pertinent Imaging: Urine reviewed.  Urine sent for culture.  Chart reviewed  Assessment & Plan: Patient is having suprapubic pain.  She might have interstitial cystitis but understands that the diagnosis is early.  She definitely could have had a bladder infection with up regulation of bladder pain.  She could have had a false negative on the urine culture because she self treat her self at home.  The role of urinary prophylaxis was discussed.  She can continue to take Pyridium.  She should have a CT scan and cystoscopy to rule out other pathology.  I would then discuss a bladder hydrodistention with her.  Call if urine culture is positive.  Patient lives in [place] and site of service can be discussed.  Patient took narcotics in the past for migraine surgery.  I gave her Mobic 15 mg once a day with 30 tablets and 1 refill.  I gave her Macrodantin 100 mg 30 tablets with 11 refills but likely will stop it in a few months and I want to prevent her from getting a possible other infection.  CT scan for significant pain ordered.  She will come back for cystoscopy.  I discussed a hydrodistention with full template today and this may be done in the future.  1. IC (interstitial cystitis)  - Urinalysis, Complete   No follow-ups on file.  Reece Packer, MD  [facility] [address] [place], [place] [address] [phone]

## 2020-11-08 NOTE — Patient Instructions (Signed)

## 2020-11-10 LAB — MICROSCOPIC EXAMINATION: RBC, Urine: NONE SEEN /hpf (ref 0–2)

## 2020-11-10 LAB — URINALYSIS, COMPLETE
Bilirubin, UA: NEGATIVE
Nitrite, UA: POSITIVE — AB
RBC, UA: NEGATIVE
Specific Gravity, UA: 1.015 (ref 1.005–1.030)
Urobilinogen, Ur: 4 mg/dL — ABNORMAL HIGH (ref 0.2–1.0)
pH, UA: 8.5 — ABNORMAL HIGH (ref 5.0–7.5)

## 2020-11-11 LAB — CULTURE, URINE COMPREHENSIVE

## 2020-11-15 ENCOUNTER — Encounter: Payer: Self-pay | Admitting: Urology

## 2020-11-15 ENCOUNTER — Ambulatory Visit (INDEPENDENT_AMBULATORY_CARE_PROVIDER_SITE_OTHER): Payer: BC Managed Care – PPO | Admitting: Urology

## 2020-11-15 ENCOUNTER — Other Ambulatory Visit: Payer: Self-pay

## 2020-11-15 VITALS — BP 100/67 | HR 57 | Ht 66.0 in | Wt 135.0 lb

## 2020-11-15 DIAGNOSIS — M5459 Other low back pain: Secondary | ICD-10-CM

## 2020-11-15 DIAGNOSIS — N3010 Interstitial cystitis (chronic) without hematuria: Secondary | ICD-10-CM

## 2020-11-15 DIAGNOSIS — Z87440 Personal history of urinary (tract) infections: Secondary | ICD-10-CM

## 2020-11-15 LAB — URINALYSIS, COMPLETE
Bilirubin, UA: NEGATIVE
Specific Gravity, UA: 1.015 (ref 1.005–1.030)
pH, UA: 6.5 (ref 5.0–7.5)

## 2020-11-15 LAB — MICROSCOPIC EXAMINATION

## 2020-11-15 NOTE — Progress Notes (Signed)
11/15/2020 11:13 AM   [employer] Douglass Rivers [birth year] [id number]  Referring provider: Wilburt Finlay, MD [address] [place],  [place] [address]  Chief Complaint  Patient presents with   Cysto    HPI: I was consulted to assess the patient's suprapubic and back pain.  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  She self treated herself with an antibiotic and then went on to have Rocephin and then Keflex.  She describes a normal renal ultrasound in the emergency room and had a tentative diagnosis of pyelonephritis.  A CT scan was not performed based on insurance apparently.  She then took prednisone and muscle relaxants that helped the suprapubic and back pain quickly but her symptoms have recurred.  She does report dyspareunia  She normally gets recurrent bladder infections with a scratchy feeling in the bladder with urgency and frequency and back pain that respond favorably to antibiotics.  She has not had bladder infections for a few years but they generally respond favorably to antibiotics.  She voids every 1 hour and cannot hold it for 2 hours.  She is getting up once or twice a night.  Her flow was poor.  She hesitates.  She often feels empty but soon after asked to urinate.  She is taking Pyridium 4 times a day.  She still has a suprapubic discomfort like a vice grip.  Oxybutynin she currently takes and is not helping  She has not had a hysterectomy.  She has had uterine ablation she is irritable bowel syndrome with alternating constipation and diarrhea.  Her gynecologist thought that she might have interstitial cystitis.  She has a history of migraine    No levator tenderness.  Levator muscles not tense.  Patient is having suprapubic pain.  She might have interstitial cystitis but understands that the diagnosis is early.  She definitely could have had a bladder infection with up regulation of bladder  pain.  She could have had a false negative on the urine culture because she self treat her self at home.  The role of urinary prophylaxis was discussed.  She can continue to take Pyridium.  She should have a CT scan and cystoscopy to rule out other pathology.  I would then discuss a bladder hydrodistention with her.  Call if urine culture is positive.  Patient lives in [place] and site of service can be discussed.   Patient took narcotics in the past for migraine surgery.  I gave her Mobic 15 mg once a day with 30 tablets and 1 refill.  I gave her Macrodantin 100 mg 30 tablets with 11 refills but likely will stop it in a few months and I want to prevent her from getting a possible other infection.  CT scan for significant pain ordered.  She will come back for cystoscopy.  I discussed a hydrodistention with full template today and this may be done in the future.  Today Frequency stable.  Suprapubic pain ongoing.  CT scan pending.  Last culture negative Cystoscopy: Patient underwent flexible cystoscopy.  Bladder mucosa and trigone were normal.  No foreign body.  No carcinoma.  No pain on bladder filling or on insertion of scope    PMH: Past Medical History:  Diagnosis Date   Headache(784.0)    migraines   Hypothyroidism    Migraines    Occipital neuralgia     Surgical History: Past  Surgical History:  Procedure Laterality Date   BIOPSY  10/23/2018   Procedure: BIOPSY;  Surgeon: Rogene Houston, MD;  [facility];  Service: Endoscopy;;   COLONOSCOPY N/A 10/23/2018   Procedure: COLONOSCOPY;  Surgeon: Rogene Houston, MD;  [facility];  Service: Endoscopy;  Laterality: N/A;  1:00   ESOPHAGOGASTRODUODENOSCOPY (EGD) WITH PROPOFOL N/A 06/13/2018   Procedure: ESOPHAGOGASTRODUODENOSCOPY (EGD) WITH PROPOFOL;  Surgeon: Rogene Houston, MD;  [facility];  Service: Endoscopy;  Laterality: N/A;   POLYPECTOMY  10/23/2018   Procedure: POLYPECTOMY;  Surgeon: Rogene Houston, MD;  [facility];  Service: Endoscopy;;   TUBAL LIGATION     uterine ablation     uterine polyps      Home Medications:  Allergies as of 11/15/2020       Reactions   Aimovig [erenumab] Other (See Comments)   Burning in the back of the head, severe migraine   Other Rash   Oxtellar Oxcarbazepine    Carbamazepine    Compazine [prochlorperazine] Other (See Comments)   IV form, can take PO        Medication List        Accurate as of November 15, 2020 11:13 AM. If you have any questions, ask your nurse or doctor.          Belbuca 75 MCG Film Generic drug: Buprenorphine HCl [place]uca 75 mcg buccal film  1 (ONE) FILM INSIDE CHEEKS TWO TIMES DAILY   Fish Oil 1000 MG Caps Take by mouth. 2 tabs daily   gabapentin 800 MG tablet Commonly known as: NEURONTIN Take 1 tablet (800 mg total) by mouth 3 (three) times daily.   levothyroxine 175 MCG tablet Commonly known as: SYNTHROID Take 175 mcg by mouth every morning.   MAGNESIUM PO Take 2,000 mg by mouth daily.   Melatonin 12 MG Tabs Take by mouth daily.   meloxicam 15 MG tablet Commonly known as: Mobic Take 1 tablet (15 mg total) by mouth daily.   nitrofurantoin 100 MG capsule Commonly known as: Macrodantin Take 1 capsule (100 mg total) by mouth daily.   Nurtec 75 MG Tbdp Generic drug: Rimegepant Sulfate TAKE 1 TABLET DAILY IF NEEDED. MAX 1 TABLET PER DAY.   ondansetron 4 MG disintegrating tablet Commonly known as: ZOFRAN-ODT Take 4 mg by mouth every 8 (eight) hours as needed.   ondansetron 8 MG tablet Commonly known as: ZOFRAN TAKE (1) TABLET BY MOUTH EVERY FOUR HOURS AS NEEDED.   oxybutynin 5 MG 24 hr tablet Commonly known as: DITROPAN-XL Take 5 mg by mouth daily.   Premarin 0.3 MG tablet Generic drug: estrogens (conjugated) daily.   progesterone 100 MG capsule Commonly known as: PROMETRIUM daily.   SUMAtriptan 6 MG/0.5ML Soaj Inject 6 mg into the skin as needed (Migraine).    Tosymra 10 MG/ACT Soln Generic drug: SUMAtriptan Place 10 mg into the nose every hour as needed (Maximum 3 sprays in 24 hours.).   valACYclovir 1000 MG tablet Commonly known as: VALTREX Take 1 tablet (1,000 mg total) by mouth 2 (two) times daily.   VITAMIN B-3 PO Take 500 mg by mouth daily.   vitamin C 500 MG tablet Commonly known as: ASCORBIC ACID Take 500 mg by mouth at bedtime.   Vitamin D3 50 MCG (2000 UT) Tabs Take by mouth daily.        Allergies:  Allergies  Allergen Reactions   Aimovig [Erenumab] Other (See Comments)  Burning in the back of the head, severe migraine   Other Rash    Oxtellar  Oxcarbazepine    Carbamazepine    Compazine [Prochlorperazine] Other (See Comments)    IV form, can take PO    Family History: Family History  Problem Relation Age of Onset   Migraines Mother        benign small bowel tumor   Prostate cancer Father    Seizures Brother        MVA   Migraines Other     Social History:  reports that she has never smoked. She has never used smokeless tobacco. She reports current alcohol use. She reports that she does not use drugs.  ROS:                                        Physical Exam: BP 100/67   Pulse (!) 57   Ht 5\' 6"  (1.676 m)   Wt 61.2 kg   BMI 21.79 kg/m   Constitutional:  Alert and oriented, No acute distress. HEENT: [place] AT, moist mucus membranes.  Trachea midline, no masses.   Laboratory Data: Lab Results  Component Value Date   WBC 3.9 (L) 09/07/2020   HGB 14.4 09/07/2020   HCT 43.8 09/07/2020   MCV 96.5 09/07/2020   PLT 206 09/07/2020    Lab Results  Component Value Date   CREATININE 0.78 09/07/2020    No results found for: PSA  No results found for: TESTOSTERONE  No results found for: HGBA1C  Urinalysis    Component Value Date/Time   COLORURINE YELLOW 09/07/2020 1700   APPEARANCEUR Hazy (A) 11/08/2020 1009   LABSPEC 1.003 (L) 09/07/2020 1700   PHURINE 6.0  09/07/2020 1700   GLUCOSEU 1+ (A) 11/08/2020 1009   HGBUR NEGATIVE 09/07/2020 1700   BILIRUBINUR Negative 11/08/2020 1009   KETONESUR NEGATIVE 09/07/2020 1700   PROTEINUR 2+ (A) 11/08/2020 1009   PROTEINUR NEGATIVE 09/07/2020 1700   UROBILINOGEN 0.2 10/19/2011 1833   NITRITE Positive (A) 11/08/2020 1009   NITRITE POSITIVE (A) 09/07/2020 1700   LEUKOCYTESUR 3+ (A) 11/08/2020 1009   LEUKOCYTESUR NEGATIVE 09/07/2020 1700    Pertinent Imaging:   Assessment & Plan: Patient may have interstitial cystitis.  We talked about hydrodistention again.  She would like to proceed.  I kept her on the Mobic for now recognizing is not helping very much.  I will keep her on the daily antibiotic and stop it in the future  1. IC (interstitial cystitis)  - Urinalysis, Complete   No follow-ups on file.  Reece Packer, MD  [facility] [address] [place], [place] [address] [phone]

## 2020-11-16 DIAGNOSIS — K589 Irritable bowel syndrome without diarrhea: Secondary | ICD-10-CM

## 2020-11-16 HISTORY — DX: Irritable bowel syndrome, unspecified: K58.9

## 2020-11-18 LAB — CULTURE, URINE COMPREHENSIVE

## 2020-11-23 ENCOUNTER — Telehealth: Payer: Self-pay | Admitting: Urology

## 2020-11-23 NOTE — Telephone Encounter (Signed)
I called pt. To schedule surgery and pt stated she does not want to proceed with surgery and has actually decided to see a Uro gynecologist. She had her first appointment on Thursday  11/18/20 and will be seeing this provider in the future.

## 2021-02-14 DIAGNOSIS — K589 Irritable bowel syndrome without diarrhea: Secondary | ICD-10-CM | POA: Insufficient documentation

## 2021-02-14 DIAGNOSIS — K582 Mixed irritable bowel syndrome: Secondary | ICD-10-CM | POA: Insufficient documentation

## 2021-02-16 DIAGNOSIS — I7300 Raynaud's syndrome without gangrene: Secondary | ICD-10-CM

## 2021-02-16 HISTORY — DX: Raynaud's syndrome without gangrene: I73.00

## 2021-03-04 DIAGNOSIS — R42 Dizziness and giddiness: Secondary | ICD-10-CM | POA: Insufficient documentation

## 2021-04-01 DIAGNOSIS — I7300 Raynaud's syndrome without gangrene: Secondary | ICD-10-CM | POA: Insufficient documentation

## 2021-05-03 ENCOUNTER — Encounter (HOSPITAL_COMMUNITY): Payer: Self-pay | Admitting: *Deleted

## 2021-05-03 ENCOUNTER — Emergency Department (HOSPITAL_COMMUNITY)
Admission: EM | Admit: 2021-05-03 | Discharge: 2021-05-03 | Disposition: A | Discharge status: Home / Self Care | Payer: Medicare Other | Attending: Student | Admitting: Student

## 2021-05-03 ENCOUNTER — Other Ambulatory Visit: Payer: Self-pay

## 2021-05-03 DIAGNOSIS — G43809 Other migraine, not intractable, without status migrainosus: Secondary | ICD-10-CM | POA: Insufficient documentation

## 2021-05-03 DIAGNOSIS — Z79899 Other long term (current) drug therapy: Secondary | ICD-10-CM | POA: Diagnosis not present

## 2021-05-03 DIAGNOSIS — E039 Hypothyroidism, unspecified: Secondary | ICD-10-CM | POA: Diagnosis not present

## 2021-05-03 DIAGNOSIS — G43909 Migraine, unspecified, not intractable, without status migrainosus: Secondary | ICD-10-CM | POA: Diagnosis present

## 2021-05-03 MED ORDER — DEXAMETHASONE SODIUM PHOSPHATE 10 MG/ML IJ SOLN
10.0000 mg | Freq: Once | INTRAMUSCULAR | Status: AC
  Administered 2021-05-03: 10 mg via INTRAVENOUS
  Filled 2021-05-03: qty 1

## 2021-05-03 MED ORDER — DROPERIDOL 2.5 MG/ML IJ SOLN
1.2500 mg | Freq: Once | INTRAMUSCULAR | Status: AC
  Administered 2021-05-03: 1.25 mg via INTRAVENOUS
  Filled 2021-05-03: qty 2

## 2021-05-03 MED ORDER — LACTATED RINGERS IV BOLUS
1000.0000 mL | Freq: Once | INTRAVENOUS | Status: AC
  Administered 2021-05-03: 1000 mL via INTRAVENOUS

## 2021-05-03 MED ORDER — METOCLOPRAMIDE HCL 5 MG/ML IJ SOLN
10.0000 mg | Freq: Once | INTRAMUSCULAR | Status: AC
  Administered 2021-05-03: 10 mg via INTRAVENOUS
  Filled 2021-05-03: qty 2

## 2021-05-03 MED ORDER — LORAZEPAM 2 MG/ML IJ SOLN
1.0000 mg | Freq: Once | INTRAMUSCULAR | Status: AC
  Administered 2021-05-03: 1 mg via INTRAVENOUS
  Filled 2021-05-03: qty 1

## 2021-05-03 MED ORDER — KETOROLAC TROMETHAMINE 15 MG/ML IJ SOLN
15.0000 mg | Freq: Once | INTRAMUSCULAR | Status: AC
  Administered 2021-05-03: 15 mg via INTRAVENOUS
  Filled 2021-05-03: qty 1

## 2021-05-03 MED ORDER — DIPHENHYDRAMINE HCL 50 MG/ML IJ SOLN
25.0000 mg | Freq: Once | INTRAMUSCULAR | Status: AC
  Administered 2021-05-03: 25 mg via INTRAVENOUS
  Filled 2021-05-03: qty 1

## 2021-05-03 NOTE — ED Triage Notes (Signed)
Pt with migraine since Friday.  Migraine meds not helping.  + N/V.  Sensitive to light and sound.  On BP meds as well.  ?

## 2021-05-03 NOTE — ED Provider Notes (Signed)
?[facility] ?Provider Note ? ?CSN: [id number] ?Arrival date & time: 05/03/21 1716 ? ?Chief Complaint(s) ?Migraine ? ?HPI ?Martha Aguilar is a 51 y.o. female with PMH chronic migraine headaches and occipital neuralgia on long-acting injectable medications and with a neurostimulator in place who presents to the emergency department for evaluation of a migraine.  Patient states that her symptoms are typical of her normal migraine, right-sided, pulsatile.  She denies associated numbness, tingling, weakness or other neurologic complaints. ? ? ?Past Medical History ?Past Medical History:  ?Diagnosis Date  ? Headache(784.0)   ? migraines  ? Hypothyroidism   ? Migraines   ? Occipital neuralgia   ? ?Patient Active Problem List  ? Diagnosis Date Noted  ? Bilateral occipital neuralgia 11/28/2018  ? Neuropathic pain 11/28/2018  ? Constipation 09/05/2018  ? Abdominal bloating 09/05/2018  ? Right upper quadrant pain 06/06/2018  ? Chronic migraine w/o aura w/o status migrainosus, not intractable 08/22/2015  ? Abdominal pain, epigastric 07/03/2013  ? Acute kidney injury ([place]) 10/20/2011  ? Nausea & vomiting 10/19/2011  ? Suprapubic abdominal pain 10/19/2011  ? Dehydration 10/19/2011  ? Hypothyroidism 10/19/2011  ? Migraine 10/19/2011  ? ?Home Medication(s) ?Prior to Admission medications   ?Medication Sig Start Date End Date Taking? Authorizing Provider  ?ALPRAZolam (XANAX) 0.5 MG tablet Take 0.5 mg by mouth 2 (two) times daily. 04/14/21  Yes [provider]  ?amitriptyline (ELAVIL) 100 MG tablet Take 100 mg by mouth at bedtime. 04/25/21  Yes [provider]  ?baclofen (LIORESAL) 20 MG tablet Take 20 mg by mouth 2 (two) times daily. 02/14/21  Yes [provider]  ?EMGALITY 120 MG/ML SOAJ Inject 1 mL into the skin every 30 (thirty) days. 04/28/21  Yes [provider]  ?gabapentin (NEURONTIN) 800 MG tablet Take 1 tablet (800 mg total) by mouth 3 (three) times daily. 06/15/20  Yes  Pieter Partridge, DO  ?hydrOXYzine (ATARAX) 50 MG tablet Take 100 mg by mouth 3 (three) times daily as needed. 04/25/21  Yes [provider]  ?levothyroxine (SYNTHROID) 150 MCG tablet Take 150 mcg by mouth daily before breakfast. Only on Sat and Sunday   Yes [provider]  ?levothyroxine (SYNTHROID) 175 MCG tablet Take 175 mcg by mouth every morning. Monday thru Friday 10/21/20  Yes [provider]  ?MAGNESIUM PO Take 2,000 mg by mouth daily.   Yes [provider]  ?Melatonin 12 MG TABS Take by mouth daily.   Yes [provider]  ?memantine (NAMENDA) 10 MG tablet Take 10 mg by mouth 2 (two) times daily. 04/29/21  Yes [provider]  ?Niacin (VITAMIN B-3 PO) Take 500 mg by mouth daily.   Yes [provider]  ?ondansetron (ZOFRAN-ODT) 4 MG disintegrating tablet Take 4 mg by mouth every 8 (eight) hours as needed. 10/30/20  Yes [provider]  ?PREMARIN 0.3 MG tablet Take 0.3 mg by mouth daily. 08/24/18  Yes [provider]  ?progesterone (PROMETRIUM) 100 MG capsule daily. 08/24/18  Yes [provider]  ?SUMAtriptan 6 MG/0.5ML SOAJ Inject 6 mg into the skin as needed (Migraine).  07/19/18  Yes [provider]  ?valACYclovir (VALTREX) 1000 MG tablet Take 1 tablet (1,000 mg total) by mouth 2 (two) times daily. 05/09/17  Yes Cresenzo-Dishmon, Joaquim Lai, CNM  ?vitamin C (ASCORBIC ACID) 500 MG tablet Take 500 mg by mouth at bedtime.   Yes [provider]  ?meloxicam (MOBIC) 15 MG tablet Take 1 tablet (15 mg total) by mouth  daily. ?Patient not taking: Reported on 05/03/2021 11/08/20   Bjorn Loser, MD  ?nitrofurantoin (MACRODANTIN) 100 MG capsule Take 1 capsule (100 mg total) by mouth daily. ?Patient not taking: Reported on 05/03/2021 11/08/20   Bjorn Loser, MD  ?ondansetron (ZOFRAN) 8 MG tablet TAKE (1) TABLET BY MOUTH EVERY FOUR HOURS AS NEEDED. ?Patient not taking: Reported on 05/03/2021 12/09/19   Pieter Partridge, DO   ?propranolol ER (INDERAL LA) 60 MG 24 hr capsule Take 60 mg by mouth daily. 04/12/21   [provider]  ?Rimegepant Sulfate (NURTEC) 75 MG TBDP TAKE 1 TABLET DAILY IF NEEDED. MAX 1 TABLET PER DAY. ?Patient not taking: Reported on 05/03/2021 02/10/20   Pieter Partridge, DO  ?SUMAtriptan (TOSYMRA) 10 MG/ACT SOLN Place 10 mg into the nose every hour as needed (Maximum 3 sprays in 24 hours.). ?Patient not taking: Reported on 05/03/2021 12/09/19   Pieter Partridge, DO  ?                                                                                                                                  ?Past Surgical History ?Past Surgical History:  ?Procedure Laterality Date  ? BIOPSY  10/23/2018  ? Procedure: BIOPSY;  Surgeon: Rogene Houston, MD;  [facility]: [facility];  Service: Endoscopy;;  ? COLONOSCOPY N/A 10/23/2018  ? Procedure: COLONOSCOPY;  Surgeon: Rogene Houston, MD;  [facility]: [facility];  Service: Endoscopy;  Laterality: N/A;  1:00  ? ESOPHAGOGASTRODUODENOSCOPY (EGD) WITH PROPOFOL N/A 06/13/2018  ? Procedure: ESOPHAGOGASTRODUODENOSCOPY (EGD) WITH PROPOFOL;  Surgeon: Rogene Houston, MD;  Location: [facility];  Service: Endoscopy;  Laterality: N/A;  ? POLYPECTOMY  10/23/2018  ? Procedure: POLYPECTOMY;  Surgeon: Rogene Houston, MD;  Location: [facility];  Service: Endoscopy;;  ? TUBAL LIGATION    ? uterine ablation    ? uterine polyps    ? ?Family History ?Family History  ?Problem Relation Age of Onset  ? Migraines Mother   ?     benign small bowel tumor  ? Prostate cancer Father   ? Seizures Brother   ?     MVA  ? Migraines Other   ? ? ?Social History ?Social History  ? ?Tobacco Use  ? Smoking status: Never  ? Smokeless tobacco: Never  ?Vaping Use  ? Vaping Use: Never used  ?Substance Use Topics  ? Alcohol use: Yes  ?  Comment: social  ? Drug use: No  ? ?Allergies ?Aimovig [erenumab], Other, Carbamazepine, Compazine [prochlorperazine], and Amoxicillin-pot clavulanate ? ?Review of Systems ?Review of  Systems  ?Neurological:  Positive for headaches.  ? ?Physical Exam ?Vital Signs  ?I have reviewed the triage vital signs ?BP (!) 151/82   Pulse 70   Temp 98.1 ?F (36.7 ?C) (Oral)   Resp (!) 68   Ht '5\' 6"'$  (1.676 m)   Wt 63.5 kg   SpO2 98%  BMI 22.60 kg/m?  ? ?Physical Exam ?Vitals and nursing note reviewed.  ?Constitutional:   ?   General: She is not in acute distress. ?   Appearance: She is well-developed.  ?HENT:  ?   Head: Normocephalic and atraumatic.  ?Eyes:  ?   Conjunctiva/sclera: Conjunctivae normal.  ?Cardiovascular:  ?   Rate and Rhythm: Normal rate and regular rhythm.  ?   Heart sounds: No murmur heard. ?Pulmonary:  ?   Effort: Pulmonary effort is normal. No respiratory distress.  ?   Breath sounds: Normal breath sounds.  ?Abdominal:  ?   Palpations: Abdomen is soft.  ?   Tenderness: There is no abdominal tenderness.  ?Musculoskeletal:     ?   General: No swelling.  ?   Cervical back: Neck supple.  ?Skin: ?   General: Skin is warm and dry.  ?   Capillary Refill: Capillary refill takes less than 2 seconds.  ?Neurological:  ?   Mental Status: She is alert.  ?   Cranial Nerves: No cranial nerve deficit.  ?   Sensory: No sensory deficit.  ?   Motor: No weakness.  ?Psychiatric:     ?   Mood and Affect: Mood normal.  ? ? ?ED Results and Treatments ?Labs ?(all labs ordered are listed, but only abnormal results are displayed) ?Labs Reviewed - No data to display                                                                                                                       ? ?Radiology ?No results found. ? ?Pertinent labs & imaging results that were available during my care of the patient were reviewed by me and considered in my medical decision making (see MDM for details). ? ?Medications Ordered in ED ?Medications  ?metoCLOPramide (REGLAN) injection 10 mg (10 mg Intravenous Given 05/03/21 1918)  ?LORazepam (ATIVAN) injection 1 mg (1 mg Intravenous Given 05/03/21 1919)  ?lactated ringers bolus 1,000 mL  (0 mLs Intravenous Stopped 05/03/21 2107)  ?diphenhydrAMINE (BENADRYL) injection 25 mg (25 mg Intravenous Given 05/03/21 2105)  ?ketorolac (TORADOL) 15 MG/ML injection 15 mg (15 mg Intravenous Given 05/03/21 2104)

## 2021-05-06 ENCOUNTER — Encounter (HOSPITAL_COMMUNITY): Payer: Self-pay | Admitting: *Deleted

## 2021-05-06 ENCOUNTER — Other Ambulatory Visit: Payer: Self-pay

## 2021-05-06 ENCOUNTER — Emergency Department (HOSPITAL_COMMUNITY)
Admission: EM | Admit: 2021-05-06 | Discharge: 2021-05-06 | Disposition: A | Discharge status: Home / Self Care | Payer: Medicare Other | Attending: Emergency Medicine | Admitting: Emergency Medicine

## 2021-05-06 DIAGNOSIS — G43909 Migraine, unspecified, not intractable, without status migrainosus: Secondary | ICD-10-CM | POA: Diagnosis present

## 2021-05-06 MED ORDER — VALPROATE SODIUM 100 MG/ML IV SOLN
500.0000 mg | Freq: Once | INTRAVENOUS | Status: AC
  Administered 2021-05-06: 500 mg via INTRAVENOUS
  Filled 2021-05-06: qty 5

## 2021-05-06 MED ORDER — SODIUM CHLORIDE 0.9 % IV BOLUS
1000.0000 mL | Freq: Once | INTRAVENOUS | Status: AC
  Administered 2021-05-06: 1000 mL via INTRAVENOUS

## 2021-05-06 MED ORDER — METOCLOPRAMIDE HCL 5 MG/ML IJ SOLN
10.0000 mg | Freq: Once | INTRAMUSCULAR | Status: AC
  Administered 2021-05-06: 10 mg via INTRAVENOUS
  Filled 2021-05-06: qty 2

## 2021-05-06 MED ORDER — DIPHENHYDRAMINE HCL 50 MG/ML IJ SOLN
25.0000 mg | Freq: Once | INTRAMUSCULAR | Status: AC
  Administered 2021-05-06: 25 mg via INTRAVENOUS
  Filled 2021-05-06: qty 1

## 2021-05-06 MED ORDER — KETOROLAC TROMETHAMINE 30 MG/ML IJ SOLN
30.0000 mg | Freq: Once | INTRAMUSCULAR | Status: AC
  Administered 2021-05-06: 30 mg via INTRAVENOUS
  Filled 2021-05-06: qty 1

## 2021-05-06 MED ORDER — METOCLOPRAMIDE HCL 10 MG PO TABS: 10.0000 mg | ORAL_TABLET | Freq: Four times a day (QID) | ORAL | 0 refills | Status: DC | PRN

## 2021-05-06 MED ORDER — VALPROATE SODIUM 500 MG/5ML IV SOLN
INTRAVENOUS | Status: AC
  Filled 2021-05-06: qty 5

## 2021-05-06 NOTE — ED Provider Notes (Signed)
?[facility] ?Provider Note ? ? ?CSN: [id number] ?Arrival date & time: 05/06/21  1716 ? ?  ? ?History ? ?Chief Complaint  ?Patient presents with  ? Migraine  ? ? ?Earleen CHANIA KOCHANSKI is a 51 y.o. female. ? ? ?Migraine ? ? ?This patient is a 51 year old female, she has a known history of headaches in fact has had multiple different surgeries for headache conditions in the past, these were nonpathological headache such as migraine type headaches, occipital neuralgia etc.  She is on multiple different medications including Emgality, Elavil, magnesium, propanolol and in the past has been on sumatriptan which she still takes and Nurtec which she no longer takes.  She was actually here within the last 3 days because of an ongoing headache that she felt was similar to her prior migraines, she had improvement with the medication she was given in the form of a headache cocktail but states that within the next 24 hours the headache came back.  It is bitemporal, squeezing associated with nausea and sensitivity to light similar to prior headaches.  She is requesting different medications to help try to break the headache.  No fevers, no chills, no stiff neck. ? ?Home Medications ?Prior to Admission medications   ?Medication Sig Start Date End Date Taking? Authorizing Provider  ?metoCLOPramide (REGLAN) 10 MG tablet Take 1 tablet (10 mg total) by mouth every 6 (six) hours as needed for nausea (and headache). 05/06/21  Yes Noemi Chapel, MD  ?ALPRAZolam Duanne Moron) 0.5 MG tablet Take 0.5 mg by mouth 2 (two) times daily. 04/14/21   [provider]  ?amitriptyline (ELAVIL) 100 MG tablet Take 100 mg by mouth at bedtime. 04/25/21   [provider]  ?baclofen (LIORESAL) 20 MG tablet Take 20 mg by mouth 2 (two) times daily. 02/14/21   [provider]  ?EMGALITY 120 MG/ML SOAJ Inject 1 mL into the skin every 30 (thirty) days. 04/28/21   [provider]  ?gabapentin (NEURONTIN) 800 MG tablet Take 1  tablet (800 mg total) by mouth 3 (three) times daily. 06/15/20   Pieter Partridge, DO  ?hydrOXYzine (ATARAX) 50 MG tablet Take 100 mg by mouth 3 (three) times daily as needed. 04/25/21   [provider]  ?levothyroxine (SYNTHROID) 150 MCG tablet Take 150 mcg by mouth daily before breakfast. Only on Sat and Sunday    [provider]  ?levothyroxine (SYNTHROID) 175 MCG tablet Take 175 mcg by mouth every morning. Monday thru Friday 10/21/20   [provider]  ?MAGNESIUM PO Take 2,000 mg by mouth daily.    [provider]  ?Melatonin 12 MG TABS Take by mouth daily.    [provider]  ?meloxicam (MOBIC) 15 MG tablet Take 1 tablet (15 mg total) by mouth daily. ?Patient not taking: Reported on 05/03/2021 11/08/20   Bjorn Loser, MD  ?memantine (NAMENDA) 10 MG tablet Take 10 mg by mouth 2 (two) times daily. 04/29/21   [provider]  ?Niacin (VITAMIN B-3 PO) Take 500 mg by mouth daily.    [provider]  ?nitrofurantoin (MACRODANTIN) 100 MG capsule Take 1 capsule (100 mg total) by mouth daily. ?Patient not taking: Reported on 05/03/2021 11/08/20   Bjorn Loser, MD  ?ondansetron (ZOFRAN) 8 MG tablet TAKE (1) TABLET BY MOUTH EVERY FOUR HOURS AS NEEDED. ?Patient not taking: Reported on 05/03/2021 12/09/19   Pieter Partridge, DO  ?ondansetron (ZOFRAN-ODT) 4 MG disintegrating tablet Take 4 mg by mouth every 8 (eight) hours as  needed. 10/30/20   [provider]  ?PREMARIN 0.3 MG tablet Take 0.3 mg by mouth daily. 08/24/18   [provider]  ?progesterone (PROMETRIUM) 100 MG capsule daily. 08/24/18   [provider]  ?propranolol ER (INDERAL LA) 60 MG 24 hr capsule Take 60 mg by mouth daily. 04/12/21   [provider]  ?Rimegepant Sulfate (NURTEC) 75 MG TBDP TAKE 1 TABLET DAILY IF NEEDED. MAX 1 TABLET PER DAY. ?Patient not taking: Reported on 05/03/2021 02/10/20   Pieter Partridge, DO  ?SUMAtriptan (TOSYMRA) 10 MG/ACT SOLN Place 10 mg into the  nose every hour as needed (Maximum 3 sprays in 24 hours.). ?Patient not taking: Reported on 05/03/2021 12/09/19   Pieter Partridge, DO  ?SUMAtriptan 6 MG/0.5ML SOAJ Inject 6 mg into the skin as needed (Migraine).  07/19/18   [provider]  ?valACYclovir (VALTREX) 1000 MG tablet Take 1 tablet (1,000 mg total) by mouth 2 (two) times daily. 05/09/17   Christin Fudge, CNM  ?vitamin C (ASCORBIC ACID) 500 MG tablet Take 500 mg by mouth at bedtime.    [provider]  ?   ? ?Allergies    ?Aimovig [erenumab], Other, Carbamazepine, Compazine [prochlorperazine], and Amoxicillin-pot clavulanate   ? ?Review of Systems   ?Review of Systems  ?All other systems reviewed and are negative. ? ?Physical Exam ?Updated Vital Signs ?BP (!) 141/86 (BP Location: Right Arm)   Pulse 79   Temp (!) 97.1 ?F (36.2 ?C) (Axillary)   Resp 14   SpO2 100%  ?Physical Exam ?Vitals and nursing note reviewed.  ?Constitutional:   ?   General: She is not in acute distress. ?   Appearance: She is well-developed.  ?HENT:  ?   Head: Normocephalic and atraumatic.  ?   Mouth/Throat:  ?   Pharynx: No oropharyngeal exudate.  ?Eyes:  ?   General: No scleral icterus.    ?   Right eye: No discharge.     ?   Left eye: No discharge.  ?   Conjunctiva/sclera: Conjunctivae normal.  ?   Pupils: Pupils are equal, round, and reactive to light.  ?Neck:  ?   Thyroid: No thyromegaly.  ?   Vascular: No JVD.  ?Cardiovascular:  ?   Rate and Rhythm: Normal rate and regular rhythm.  ?   Heart sounds: Normal heart sounds. No murmur heard. ?  No friction rub. No gallop.  ?Pulmonary:  ?   Effort: Pulmonary effort is normal. No respiratory distress.  ?   Breath sounds: Normal breath sounds. No wheezing or rales.  ?Abdominal:  ?   General: Bowel sounds are normal. There is no distension.  ?   Palpations: Abdomen is soft. There is no mass.  ?   Tenderness: There is no abdominal tenderness.  ?Musculoskeletal:     ?   General: No tenderness. Normal range of  motion.  ?   Cervical back: Normal range of motion and neck supple.  ?Lymphadenopathy:  ?   Cervical: No cervical adenopathy.  ?Skin: ?   General: Skin is warm and dry.  ?   Findings: No erythema or rash.  ?Neurological:  ?   Mental Status: She is alert.  ?   Coordination: Coordination normal.  ?   Comments: Awake alert able to follow commands, speaks slowly  ?Psychiatric:     ?   Behavior: Behavior normal.  ? ? ?ED Results / Procedures / Treatments   ?Labs ?(all labs ordered are listed, but  only abnormal results are displayed) ?Labs Reviewed - No data to display ? ?EKG ?None ? ?Radiology ?No results found. ? ?Procedures ?Procedures  ? ? ?Medications Ordered in ED ?Medications  ?ketorolac (TORADOL) 30 MG/ML injection 30 mg (30 mg Intravenous Given 05/06/21 1831)  ?metoCLOPramide (REGLAN) injection 10 mg (10 mg Intravenous Given 05/06/21 1832)  ?diphenhydrAMINE (BENADRYL) injection 25 mg (25 mg Intravenous Given 05/06/21 1832)  ?valproate (DEPACON) 500 mg in dextrose 5 % 50 mL IVPB (0 mg Intravenous Stopped 05/06/21 2001)  ?sodium chloride 0.9 % bolus 1,000 mL (0 mLs Intravenous Stopped 05/06/21 1912)  ? ? ?ED Course/ Medical Decision Making/ A&P ?  ?                        ?Medical Decision Making ?Risk ?Prescription drug management. ? ? ?This patient presents to the ED for concern of going headache, this involves an extensive number of treatment options, and is a complaint that carries with it a high risk of complications and morbidity.  The differential diagnosis includes migraine or tension headache, unlikely to be infectious, vital signs are unremarkable ? ? ?Co morbidities that complicate the patient evaluation ? ?Chronic headaches ? ? ?Additional history obtained: ? ?Additional history obtained from electronic medical record ?External records from outside source obtained and reviewed including prior medications that she has been given in the past ? ? ?Lab Tests: ? ?I Ordered, and personally interpreted labs.  The  pertinent results include: None ? ? ?Imaging Studies ordered: ? ?I ordered imaging studies including none ? ? ?Problem List / ED Course / Critical interventions / Medication management ? ?Headache, will attem

## 2021-05-06 NOTE — Discharge Instructions (Signed)
I would strongly encourage you to follow-up with your neurologist this week.  We have tried multiple different medications to try to help break your headache.  If your headache continues to come back I would strongly recommend going to [facility] emergency department for further evaluation of your headache where your neurologist is. ? ?You may try taking Reglan 1 tablet every 6 hours as needed for severe or worsening headache nausea or migraine type symptoms.  If you have a feeling of severe anxiety, muscle contractions or stiffness I want you to take 25 mg of Benadryl and do not take the Reglan anymore. ? ?Emergency department for severe or worsening symptoms ?

## 2021-05-06 NOTE — ED Notes (Signed)
AC called to locate depacon  ?

## 2021-05-06 NOTE — ED Triage Notes (Signed)
Pt with continued migraine HA. Seen on Tuesday for same. ?

## 2021-05-06 NOTE — ED Notes (Signed)
Verbal orders given from Sabra Heck, MD to run valproate (depacon) over 15 minutes.  ?

## 2021-05-16 ENCOUNTER — Encounter (HOSPITAL_COMMUNITY): Payer: Self-pay

## 2021-05-16 ENCOUNTER — Ambulatory Visit (INDEPENDENT_AMBULATORY_CARE_PROVIDER_SITE_OTHER): Payer: Medicare Other | Admitting: Psychiatry

## 2021-05-16 ENCOUNTER — Encounter (HOSPITAL_COMMUNITY): Payer: Self-pay | Admitting: Psychiatry

## 2021-05-16 DIAGNOSIS — F321 Major depressive disorder, single episode, moderate: Secondary | ICD-10-CM

## 2021-05-16 DIAGNOSIS — I871 Compression of vein: Secondary | ICD-10-CM | POA: Insufficient documentation

## 2021-05-16 NOTE — Plan of Care (Signed)
This is a new plan, Pt participated in development of plan ?Problem: Depression CCP sleeping excessively, depressed mood, negative thoughts  ?Goal: I want help with depression and anxiety ?Outcome: Not Progressing ?Goal: LTG: Martha Aguilar WILL SCORE LESS THAN 10 ON THE PATIENT HEALTH QUESTIONNAIRE (PHQ-9) ?Outcome: Not Progressing ?Goal: STG: Martha Aguilar WILL IDENTIFY 3 COGNITIVE PATTERNS AND BELIEFS THAT SUPPORT DEPRESSION ?Outcome: Not Progressing ?  ?
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
## 2021-06-01 ENCOUNTER — Ambulatory Visit (HOSPITAL_COMMUNITY): Payer: Medicare Other | Admitting: Psychiatry

## 2021-06-01 ENCOUNTER — Encounter (HOSPITAL_COMMUNITY): Payer: Self-pay

## 2021-06-13 ENCOUNTER — Other Ambulatory Visit: Payer: Self-pay

## 2021-06-13 ENCOUNTER — Ambulatory Visit (HOSPITAL_COMMUNITY): Payer: Medicare Other | Admitting: Psychiatry

## 2021-06-13 ENCOUNTER — Emergency Department (HOSPITAL_COMMUNITY)
Admission: EM | Admit: 2021-06-13 | Discharge: 2021-06-13 | Disposition: A | Discharge status: Home / Self Care | Payer: Medicare Other | Attending: Emergency Medicine | Admitting: Emergency Medicine

## 2021-06-13 ENCOUNTER — Encounter (HOSPITAL_COMMUNITY): Payer: Self-pay | Admitting: *Deleted

## 2021-06-13 DIAGNOSIS — R519 Headache, unspecified: Secondary | ICD-10-CM | POA: Diagnosis present

## 2021-06-13 DIAGNOSIS — G43009 Migraine without aura, not intractable, without status migrainosus: Secondary | ICD-10-CM | POA: Insufficient documentation

## 2021-06-13 MED ORDER — DIPHENHYDRAMINE HCL 50 MG/ML IJ SOLN
12.5000 mg | Freq: Once | INTRAMUSCULAR | Status: AC
Start: 2021-06-13 — End: 2021-06-13
  Administered 2021-06-13: 12.5 mg via INTRAVENOUS
  Filled 2021-06-13: qty 1

## 2021-06-13 MED ORDER — SODIUM CHLORIDE 0.9 % IV BOLUS
1000.0000 mL | Freq: Once | INTRAVENOUS | Status: AC
  Administered 2021-06-13: 1000 mL via INTRAVENOUS

## 2021-06-13 MED ORDER — KETOROLAC TROMETHAMINE 30 MG/ML IJ SOLN
15.0000 mg | Freq: Once | INTRAMUSCULAR | Status: AC
  Administered 2021-06-13: 15 mg via INTRAVENOUS
  Filled 2021-06-13: qty 1

## 2021-06-13 MED ORDER — METOCLOPRAMIDE HCL 5 MG/ML IJ SOLN
10.0000 mg | Freq: Once | INTRAMUSCULAR | Status: AC
  Administered 2021-06-13: 10 mg via INTRAVENOUS
  Filled 2021-06-13: qty 2

## 2021-06-13 MED ORDER — DEXAMETHASONE SODIUM PHOSPHATE 10 MG/ML IJ SOLN
10.0000 mg | Freq: Once | INTRAMUSCULAR | Status: AC
  Administered 2021-06-13: 10 mg via INTRAVENOUS
  Filled 2021-06-13: qty 1

## 2021-06-13 MED ORDER — SUMATRIPTAN SUCCINATE 100 MG PO TABS: 100.0000 mg | ORAL_TABLET | ORAL | 0 refills | Status: AC | PRN

## 2021-06-13 NOTE — ED Triage Notes (Addendum)
Pt with recurrent migraine HA since Thursday night.  Pt has taken 6 Imitrex shots with no relief. Taken Depakote and Reglan as well.

## 2021-06-13 NOTE — ED Provider Notes (Signed)
[facility] EMERGENCY DEPARTMENT Provider Note   CSN: [id number] Arrival date & time: 06/13/21  1324     History  Chief Complaint  Patient presents with   Migraine    Martha Aguilar is a 51 y.o. female with a complicated history of occipital neuralgia and migraine headaches, who has undergone multiple treatment including surgical neurostimulator placement for her headaches, currently receiving Emgality treatments and using Imitrex for breakthrough headaches presenting for a migraine headache which started 3 days ago and has not responded to 6 Imitrex injections.  She reports a typical migraine for her without any new findings, including having photosensitivity, nausea and emesis which she has treated with Zofran.  She denies fevers or chills, denies focal weakness or numbness.  This is a typical migraine for her. The history is provided by the patient.      Home Medications Prior to Admission medications   Medication Sig Start Date End Date Taking? Authorizing Provider  SUMAtriptan (IMITREX) 100 MG tablet Take 1 tablet (100 mg total) by mouth every 2 (two) hours as needed for migraine. May repeat in 2 hours if headache persists or recurs. 06/13/21  Yes Karsten Vaughn, Almyra Free, PA-C  ALPRAZolam Duanne Moron) 0.5 MG tablet Take 0.5 mg by mouth 2 (two) times daily. 04/14/21   [provider]  amitriptyline (ELAVIL) 100 MG tablet Take 100 mg by mouth at bedtime. 04/25/21   [provider]  baclofen (LIORESAL) 20 MG tablet Take 20 mg by mouth 2 (two) times daily. 02/14/21   [provider]  Divalproex Sodium (DEPAKOTE PO) Take by mouth.    [provider]  EMGALITY 120 MG/ML SOAJ Inject 1 mL into the skin every 30 (thirty) days. 04/28/21   [provider]  gabapentin (NEURONTIN) 800 MG tablet Take 1 tablet (800 mg total) by mouth 3 (three) times daily. 06/15/20   Pieter Partridge, DO  hydrOXYzine (ATARAX) 50 MG tablet Take 100 mg by mouth 3 (three) times daily as needed.  04/25/21   [provider]  levothyroxine (SYNTHROID) 150 MCG tablet Take 150 mcg by mouth daily before breakfast. Only on Sat and Sunday    [provider]  levothyroxine (SYNTHROID) 175 MCG tablet Take 175 mcg by mouth every morning. Monday thru Friday 10/21/20   [provider]  MAGNESIUM PO Take 2,000 mg by mouth daily.    [provider]  Melatonin 12 MG TABS Take by mouth daily. Patient not taking: Reported on 05/16/2021    [provider]  meloxicam (MOBIC) 15 MG tablet Take 1 tablet (15 mg total) by mouth daily. Patient not taking: Reported on 05/03/2021 11/08/20   Bjorn Loser, MD  memantine (NAMENDA) 10 MG tablet Take 10 mg by mouth 2 (two) times daily. 04/29/21   [provider]  metoCLOPramide (REGLAN) 10 MG tablet Take 1 tablet (10 mg total) by mouth every 6 (six) hours as needed for nausea (and headache). 05/06/21   Noemi Chapel, MD  Niacin (VITAMIN B-3 PO) Take 500 mg by mouth daily.    [provider]  nitrofurantoin (MACRODANTIN) 100 MG capsule Take 1 capsule (100 mg total) by mouth daily. Patient not taking: Reported on 05/03/2021 11/08/20   Bjorn Loser, MD  ondansetron (ZOFRAN) 8 MG tablet TAKE (1) TABLET BY MOUTH EVERY FOUR HOURS AS NEEDED. 12/09/19   Tomi Likens, Adam R, DO  ondansetron (ZOFRAN-ODT) 4 MG disintegrating tablet Take 4 mg by mouth every 8 (eight) hours as needed. 10/30/20   [provider]  PREMARIN 0.3 MG tablet Take 0.3 mg by mouth daily. 08/24/18   [provider]  progesterone (PROMETRIUM) 100 MG capsule daily. 08/24/18   [provider]  propranolol ER (INDERAL LA) 60 MG 24 hr capsule Take 60 mg by mouth daily. 04/12/21   [provider]  Rimegepant Sulfate (NURTEC) 75 MG TBDP TAKE 1 TABLET DAILY IF NEEDED. MAX 1 TABLET PER DAY. Patient not taking: Reported on 05/03/2021 02/10/20   Pieter Partridge, DO  SUMAtriptan (TOSYMRA) 10 MG/ACT SOLN Place 10 mg into the nose every  hour as needed (Maximum 3 sprays in 24 hours.). 12/09/19   Pieter Partridge, DO  SUMAtriptan 6 MG/0.5ML SOAJ Inject 6 mg into the skin as needed (Migraine).  07/19/18   [provider]  valACYclovir (VALTREX) 1000 MG tablet Take 1 tablet (1,000 mg total) by mouth 2 (two) times daily. 05/09/17   Cresenzo-Dishmon, Joaquim Lai, CNM  vitamin C (ASCORBIC ACID) 500 MG tablet Take 500 mg by mouth at bedtime.    [provider]      Allergies    Aimovig [erenumab], Other, Carbamazepine, Compazine [prochlorperazine], and Amoxicillin-pot clavulanate    Review of Systems   Review of Systems  Constitutional:  Negative for fever.  HENT:  Negative for congestion and sore throat.   Eyes:  Positive for photophobia.  Respiratory:  Negative for chest tightness and shortness of breath.   Cardiovascular:  Negative for chest pain.  Gastrointestinal:  Negative for abdominal pain and nausea.  Genitourinary: Negative.   Musculoskeletal:  Negative for arthralgias, joint swelling and neck pain.  Skin: Negative.  Negative for rash and wound.  Neurological:  Positive for headaches. Negative for dizziness, weakness, light-headedness and numbness.  Psychiatric/Behavioral: Negative.    All other systems reviewed and are negative.  Physical Exam Updated Vital Signs BP 128/70 (BP Location: Left Arm)   Pulse 72   Temp 97.6 F (36.4 C) (Oral)   Resp 16   Ht '5\' 6"'$  (1.676 m)   Wt 66.3 kg   SpO2 100%   BMI 23.58 kg/m  Physical Exam Vitals and nursing note reviewed.  Constitutional:      Appearance: She is well-developed.     Comments: Uncomfortable appearing  HENT:     Head: Normocephalic and atraumatic.     Right Ear: Tympanic membrane normal.     Left Ear: Tympanic membrane normal.  Eyes:     Extraocular Movements: Extraocular movements intact.     Pupils: Pupils are equal, round, and reactive to light.  Cardiovascular:     Rate and Rhythm: Normal rate.     Heart sounds: Normal heart sounds.   Pulmonary:     Effort: Pulmonary effort is normal.  Abdominal:     Palpations: Abdomen is soft.     Tenderness: There is no abdominal tenderness.  Musculoskeletal:        General: Normal range of motion.     Cervical back: Normal range of motion and neck supple.  Lymphadenopathy:     Cervical: No cervical adenopathy.  Skin:    General: Skin is warm and dry.     Findings: No rash.  Neurological:     General: No focal deficit present.     Mental Status: She is alert and oriented to person, place, and time.     GCS: GCS eye subscore is 4. GCS verbal subscore is 5. GCS motor subscore is 6.     Cranial Nerves: No cranial nerve deficit.  Sensory: No sensory deficit.     Coordination: Coordination normal.     Gait: Gait normal.     Deep Tendon Reflexes: Reflexes normal.     Comments: Normal heel-shin, normal rapid alternating movements. Cranial nerves III-XII intact.  No pronator drift.  Psychiatric:        Speech: Speech normal.        Behavior: Behavior normal.        Thought Content: Thought content normal.    ED Results / Procedures / Treatments   Labs (all labs ordered are listed, but only abnormal results are displayed) Labs Reviewed - No data to display  EKG None  Radiology No results found.  Procedures Procedures    Medications Ordered in ED Medications  sodium chloride 0.9 % bolus 1,000 mL (0 mLs Intravenous Stopped 06/13/21 1641)  metoCLOPramide (REGLAN) injection 10 mg (10 mg Intravenous Given 06/13/21 1547)  diphenhydrAMINE (BENADRYL) injection 12.5 mg (12.5 mg Intravenous Given 06/13/21 1549)  ketorolac (TORADOL) 30 MG/ML injection 15 mg (15 mg Intravenous Given 06/13/21 1545)  dexamethasone (DECADRON) injection 10 mg (10 mg Intravenous Given 06/13/21 1548)    ED Course/ Medical Decision Making/ A&P                           Medical Decision Making Patient with atypical migraine headache without any neurologic deficits on exam.  She was given IV fluids along  with Reglan, Benadryl, Toradol and Decadron after which her headache had completely resolved.  She was symptom-free at time of discharge.  Risk Prescription drug management.           Final Clinical Impression(s) / ED Diagnoses Final diagnoses:  Migraine without aura and without status migrainosus, not intractable    Rx / DC Orders ED Discharge Orders          Ordered    SUMAtriptan (IMITREX) 100 MG tablet  Every 2 hours PRN        06/13/21 1830              Evalee Jefferson, PA-C 06/13/21 1928    Godfrey Pick, MD 06/14/21 [phone]

## 2021-06-13 NOTE — Discharge Instructions (Signed)
Followup with your neurologist as needed.

## 2021-06-16 ENCOUNTER — Ambulatory Visit (INDEPENDENT_AMBULATORY_CARE_PROVIDER_SITE_OTHER): Payer: Medicare Other | Admitting: Psychiatry

## 2021-06-16 DIAGNOSIS — F321 Major depressive disorder, single episode, moderate: Secondary | ICD-10-CM | POA: Diagnosis not present
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
## 2021-06-20 ENCOUNTER — Ambulatory Visit (HOSPITAL_COMMUNITY): Payer: Medicare Other | Admitting: Psychiatry

## 2021-06-24 ENCOUNTER — Encounter (HOSPITAL_COMMUNITY): Payer: Self-pay

## 2021-06-24 ENCOUNTER — Ambulatory Visit (HOSPITAL_COMMUNITY): Payer: Medicare Other | Admitting: Psychiatry

## 2021-07-01 ENCOUNTER — Ambulatory Visit (INDEPENDENT_AMBULATORY_CARE_PROVIDER_SITE_OTHER): Payer: Medicare Other | Admitting: Psychiatry

## 2021-07-01 DIAGNOSIS — F321 Major depressive disorder, single episode, moderate: Secondary | ICD-10-CM

## 2021-07-05 ENCOUNTER — Ambulatory Visit (HOSPITAL_COMMUNITY): Payer: Medicare Other | Admitting: Psychiatry

## 2021-07-14 ENCOUNTER — Ambulatory Visit (HOSPITAL_COMMUNITY): Payer: Medicare Other | Admitting: Psychiatry

## 2021-07-25 DIAGNOSIS — N3010 Interstitial cystitis (chronic) without hematuria: Secondary | ICD-10-CM | POA: Diagnosis not present

## 2021-08-09 ENCOUNTER — Telehealth (HOSPITAL_COMMUNITY): Payer: Self-pay | Admitting: *Deleted

## 2021-08-09 NOTE — Telephone Encounter (Signed)
Patient called and [facility] stating she would like to sch appt staff called her and was not able to reach her or leave message due to VM being full

## 2021-10-03 ENCOUNTER — Telehealth (HOSPITAL_COMMUNITY): Payer: Self-pay | Admitting: Psychiatry

## 2021-10-03 ENCOUNTER — Ambulatory Visit (HOSPITAL_COMMUNITY): Payer: Medicare HMO | Admitting: Psychiatry

## 2021-10-03 NOTE — Telephone Encounter (Signed)
Therapist attempted to contact patient via phone for scheduled appointment and received voicemail recording.  Therapist left message indicating attempt and requesting patient call office.

## 2021-10-11 DIAGNOSIS — G43019 Migraine without aura, intractable, without status migrainosus: Secondary | ICD-10-CM | POA: Diagnosis not present

## 2021-10-11 DIAGNOSIS — Z4542 Encounter for adjustment and management of neuropacemaker (brain) (peripheral nerve) (spinal cord): Secondary | ICD-10-CM | POA: Diagnosis not present

## 2021-11-03 ENCOUNTER — Ambulatory Visit (HOSPITAL_COMMUNITY): Payer: Medicare HMO | Admitting: Psychiatry

## 2021-11-06 ENCOUNTER — Other Ambulatory Visit: Payer: Self-pay

## 2021-11-06 ENCOUNTER — Emergency Department (HOSPITAL_COMMUNITY)
Admission: EM | Admit: 2021-11-06 | Discharge: 2021-11-06 | Disposition: A | Discharge status: Home / Self Care | Payer: Medicare HMO | Attending: Emergency Medicine | Admitting: Emergency Medicine

## 2021-11-06 ENCOUNTER — Encounter (HOSPITAL_COMMUNITY): Payer: Self-pay | Admitting: *Deleted

## 2021-11-06 DIAGNOSIS — H53149 Visual discomfort, unspecified: Secondary | ICD-10-CM | POA: Insufficient documentation

## 2021-11-06 DIAGNOSIS — R519 Headache, unspecified: Secondary | ICD-10-CM | POA: Insufficient documentation

## 2021-11-06 LAB — CBC WITH DIFFERENTIAL/PLATELET
Abs Immature Granulocytes: 0.01 10*3/uL (ref 0.00–0.07)
Basophils Absolute: 0 10*3/uL (ref 0.0–0.1)
Basophils Relative: 1 %
Eosinophils Absolute: 0.1 10*3/uL (ref 0.0–0.5)
Eosinophils Relative: 1 %
HCT: 39.8 % (ref 36.0–46.0)
Hemoglobin: 13 g/dL (ref 12.0–15.0)
Immature Granulocytes: 0 %
Lymphocytes Relative: 26 %
Lymphs Abs: 1.3 10*3/uL (ref 0.7–4.0)
MCH: 33.2 pg (ref 26.0–34.0)
MCHC: 32.7 g/dL (ref 30.0–36.0)
MCV: 101.8 fL — ABNORMAL HIGH (ref 80.0–100.0)
Monocytes Absolute: 0.5 10*3/uL (ref 0.1–1.0)
Monocytes Relative: 9 %
Neutro Abs: 3.2 10*3/uL (ref 1.7–7.7)
Neutrophils Relative %: 63 %
Platelets: 190 10*3/uL (ref 150–400)
RBC: 3.91 MIL/uL (ref 3.87–5.11)
RDW: 12.6 % (ref 11.5–15.5)
WBC: 5 10*3/uL (ref 4.0–10.5)
nRBC: 0 % (ref 0.0–0.2)

## 2021-11-06 LAB — COMPREHENSIVE METABOLIC PANEL
ALT: 13 U/L (ref 0–44)
AST: 19 U/L (ref 15–41)
Albumin: 4.2 g/dL (ref 3.5–5.0)
Alkaline Phosphatase: 45 U/L (ref 38–126)
Anion gap: 6 (ref 5–15)
BUN: 14 mg/dL (ref 6–20)
CO2: 30 mmol/L (ref 22–32)
Calcium: 8.9 mg/dL (ref 8.9–10.3)
Chloride: 102 mmol/L (ref 98–111)
Creatinine, Ser: 0.76 mg/dL (ref 0.44–1.00)
GFR, Estimated: >60 mL/min (ref >60)
Glucose, Bld: 106 mg/dL — ABNORMAL HIGH (ref 70–99)
Potassium: 4 mmol/L (ref 3.5–5.1)
Sodium: 138 mmol/L (ref 135–145)
Total Bilirubin: 0.8 mg/dL (ref 0.3–1.2)
Total Protein: 6.8 g/dL (ref 6.5–8.1)

## 2021-11-06 MED ORDER — KETOROLAC TROMETHAMINE 15 MG/ML IJ SOLN
15.0000 mg | Freq: Once | INTRAMUSCULAR | Status: AC
  Administered 2021-11-06: 15 mg via INTRAVENOUS
  Filled 2021-11-06: qty 1

## 2021-11-06 MED ORDER — ONDANSETRON HCL 4 MG/2ML IJ SOLN
4.0000 mg | Freq: Once | INTRAMUSCULAR | Status: AC
  Administered 2021-11-06: 4 mg via INTRAVENOUS
  Filled 2021-11-06: qty 2

## 2021-11-06 MED ORDER — DIPHENHYDRAMINE HCL 50 MG/ML IJ SOLN
12.5000 mg | Freq: Once | INTRAMUSCULAR | Status: AC
Start: 2021-11-06 — End: 2021-11-06
  Administered 2021-11-06: 12.5 mg via INTRAVENOUS
  Filled 2021-11-06: qty 1

## 2021-11-06 MED ORDER — SODIUM CHLORIDE 0.9 % IV BOLUS
1000.0000 mL | Freq: Once | INTRAVENOUS | Status: AC
  Administered 2021-11-06: 1000 mL via INTRAVENOUS

## 2021-11-06 MED ORDER — DEXAMETHASONE SODIUM PHOSPHATE 10 MG/ML IJ SOLN
10.0000 mg | Freq: Once | INTRAMUSCULAR | Status: AC
Start: 2021-11-06 — End: 2021-11-06
  Administered 2021-11-06: 10 mg via INTRAVENOUS
  Filled 2021-11-06: qty 1

## 2021-11-06 MED ORDER — SODIUM CHLORIDE 0.9 % IV SOLN: INTRAVENOUS | Status: DC

## 2021-11-06 NOTE — Discharge Instructions (Signed)
Evaluation for headache revealed that your symptoms are likely related to an ongoing migraine.  Encouraged that you showed some improvement of your symptoms with headache cocktail given today in the emergency department.  Recommend that you follow-up with your neurologist within the next week.  If you have new headache that is the worst in your life, new facial droop, slurred speech, weakness or numbness in your extremities or changes in your gait please return to the emergency department for further evaluation.

## 2021-11-06 NOTE — ED Provider Notes (Signed)
Martha Aguilar Provider Note   CSN: [id number] Arrival date & time: 11/06/21  1320     History  Chief Complaint  Patient presents with   Migraine   [name] is a 51 y.o. female with migraines, fibromuscular dysplasia, and occipital neuralgia presenting for headache.  Headache started this morning.  States that is very similar to migraine she had in the past.  States that most of her migraines start in the morning.  Endorses photophobia and phonophobia.  Endorses extensive history of migraines status post migraine stimulator placed in 2021 and frontal migraine surgery in 2022.  States that she took an Imitrex injection and Benadryl at home today which is her normal "headache cocktail".  States that her symptoms have have worsened which prompted her to be evaluated in the emergency department.  States that she has close follow-up with neurology at [facility].  Patient not on blood thinners.  Denies trauma.   Migraine Associated symptoms include headaches.   Migraine Associated symptoms include headaches.       Home Medications Prior to Admission medications   Medication Sig Start Date End Date Taking? Authorizing Provider  ALPRAZolam Duanne Moron) 0.5 MG tablet Take 0.5 mg by mouth 2 (two) times daily. 04/14/21   [provider]  amitriptyline (ELAVIL) 100 MG tablet Take 100 mg by mouth at bedtime. 04/25/21   [provider]  baclofen (LIORESAL) 20 MG tablet Take 20 mg by mouth 2 (two) times daily. 02/14/21   [provider]  Divalproex Sodium (DEPAKOTE PO) Take by mouth.    [provider]  EMGALITY 120 MG/ML SOAJ Inject 1 mL into the skin every 30 (thirty) days. 04/28/21   [provider]  gabapentin (NEURONTIN) 800 MG tablet Take 1 tablet (800 mg total) by mouth 3 (three) times daily. 06/15/20   Pieter Partridge, DO  hydrOXYzine (ATARAX) 50 MG tablet Take 100 mg by mouth 3 (three) times daily as needed. 04/25/21    [provider]  levothyroxine (SYNTHROID) 150 MCG tablet Take 150 mcg by mouth daily before breakfast. Only on Sat and Sunday    [provider]  levothyroxine (SYNTHROID) 175 MCG tablet Take 175 mcg by mouth every morning. Monday thru Friday 10/21/20   [provider]  MAGNESIUM PO Take 2,000 mg by mouth daily.    [provider]  Melatonin 12 MG TABS Take by mouth daily. Patient not taking: Reported on 05/16/2021    [provider]  meloxicam (MOBIC) 15 MG tablet Take 1 tablet (15 mg total) by mouth daily. Patient not taking: Reported on 05/03/2021 11/08/20   Bjorn Loser, MD  memantine (NAMENDA) 10 MG tablet Take 10 mg by mouth 2 (two) times daily. 04/29/21   [provider]  metoCLOPramide (REGLAN) 10 MG tablet Take 1 tablet (10 mg total) by mouth every 6 (six) hours as needed for nausea (and headache). 05/06/21   Noemi Chapel, MD  Niacin (VITAMIN B-3 PO) Take 500 mg by mouth daily.    [provider]  nitrofurantoin (MACRODANTIN) 100 MG capsule Take 1 capsule (100 mg total) by mouth daily. Patient not taking: Reported on 05/03/2021 11/08/20   Bjorn Loser, MD  ondansetron (ZOFRAN) 8 MG tablet TAKE (1) TABLET BY MOUTH EVERY FOUR HOURS AS NEEDED. 12/09/19   Tomi Likens, Adam R, DO  ondansetron (ZOFRAN-ODT) 4 MG disintegrating tablet Take 4 mg by mouth every 8 (eight) hours as needed. 10/30/20   [provider]  Randa Lynn  0.3 MG tablet Take 0.3 mg by mouth daily. 08/24/18   [provider]  progesterone (PROMETRIUM) 100 MG capsule daily. 08/24/18   [provider]  propranolol ER (INDERAL LA) 60 MG 24 hr capsule Take 60 mg by mouth daily. 04/12/21   [provider]  Rimegepant Sulfate (NURTEC) 75 MG TBDP TAKE 1 TABLET DAILY IF NEEDED. MAX 1 TABLET PER DAY. Patient not taking: Reported on 05/03/2021 02/10/20   Pieter Partridge, DO  SUMAtriptan (IMITREX) 100 MG tablet Take 1 tablet (100 mg total) by mouth  every 2 (two) hours as needed for migraine. May repeat in 2 hours if headache persists or recurs. 06/13/21   Evalee Jefferson, PA-C  SUMAtriptan (TOSYMRA) 10 MG/ACT SOLN Place 10 mg into the nose every hour as needed (Maximum 3 sprays in 24 hours.). 12/09/19   Pieter Partridge, DO  SUMAtriptan 6 MG/0.5ML SOAJ Inject 6 mg into the skin as needed (Migraine).  07/19/18   [provider]  valACYclovir (VALTREX) 1000 MG tablet Take 1 tablet (1,000 mg total) by mouth 2 (two) times daily. 05/09/17   Cresenzo-Dishmon, Joaquim Lai, CNM  vitamin C (ASCORBIC ACID) 500 MG tablet Take 500 mg by mouth at bedtime.    [provider]      Allergies    Aimovig [erenumab], Other, Carbamazepine, Compazine [prochlorperazine], and Amoxicillin-pot clavulanate    Review of Systems   Review of Systems  Neurological:  Positive for headaches.    Physical Exam Updated Vital Signs BP 137/68 (BP Location: Left Arm)   Pulse 77   Temp 97.6 F (36.4 C) (Oral)   Resp 18   Ht '5\' 6"'$  (1.676 m)   Wt 63.5 kg   SpO2 99%   BMI 22.60 kg/m  Physical Exam Vitals and nursing note reviewed.  Constitutional:      Comments: GCS 15. Speech is goal oriented. No deficits appreciated to CN III-XII; symmetric eyebrow raise, no facial drooping, tongue midline. Patient has equal grip strength bilaterally with 5/5 strength against resistance in all major muscle groups bilaterally. Sensation to light touch intact. Patient moves extremities without ataxia. Normal finger-nose-finger. Patient ambulatory with steady gait.   HENT:     Head: Normocephalic and atraumatic.     Mouth/Throat:     Mouth: Mucous membranes are moist.  Eyes:     General:        Right eye: No discharge.        Left eye: No discharge.     Conjunctiva/sclera: Conjunctivae normal.  Cardiovascular:     Rate and Rhythm: Normal rate and regular rhythm.     Pulses: Normal pulses.     Heart sounds: Normal heart sounds.  Pulmonary:     Effort: Pulmonary effort is  normal.     Breath sounds: Normal breath sounds.  Abdominal:     General: Abdomen is flat.     Palpations: Abdomen is soft.  Skin:    General: Skin is warm and dry.  Neurological:     General: No focal deficit present.  Psychiatric:        Mood and Affect: Mood normal.     ED Results / Procedures / Treatments   Labs (all labs ordered are listed, but only abnormal results are displayed) Labs Reviewed  CBC WITH DIFFERENTIAL/PLATELET - Abnormal; Notable for the following components:      Result Value   MCV 101.8 (*)    All other components within normal limits  COMPREHENSIVE METABOLIC PANEL -  Abnormal; Notable for the following components:   Glucose, Bld 106 (*)    All other components within normal limits    EKG None  Radiology No results found.  Procedures Procedures    Medications Ordered in ED Medications  sodium chloride 0.9 % bolus 1,000 mL (1,000 mLs Intravenous New Bag/Given 11/06/21 1616)    And  0.9 %  sodium chloride infusion (has no administration in time range)  ketorolac (TORADOL) 15 MG/ML injection 15 mg (15 mg Intravenous Given 11/06/21 1617)  ondansetron (ZOFRAN) injection 4 mg (4 mg Intravenous Given 11/06/21 1617)  diphenhydrAMINE (BENADRYL) injection 12.5 mg (12.5 mg Intravenous Given 11/06/21 1617)  dexamethasone (DECADRON) injection 10 mg (10 mg Intravenous Given 11/06/21 1617)    ED Course/ Medical Decision Making/ A&P                           Medical Decision Making  This patient presents to the ED for concern of headache, this involves a number of treatment options, and is a complaint that carries with it a high risk of complications and morbidity.  The differential diagnosis includes SAH, migraines, hypertensive emergency, and and meningitis.    Co morbidities: Discussed in HPI   EMR reviewed including pt PMHx, past surgical history and past visits to ER.   See HPI for more details   Lab Tests:   I ordered and independently  interpreted labs. Labs notable for hyperglycemia   Imaging Studies:  No imaging studies ordered for this patient    Cardiac Monitoring:  The patient was maintained on a cardiac monitor.  I personally viewed and interpreted the cardiac monitored which showed an underlying rhythm of: NSR NA   Medicines ordered:  I ordered medication including Decadron Benadryl, Toradol, and Zofran for headache.  Normal saline for volume resuscitation. Reevaluation of the patient after these medicines showed that the patient improved I have reviewed the patients home medicines and have made adjustments as needed    Consults/Attending Physician   I discussed this case with my attending physician who cosigned this note including patient's presenting symptoms, physical exam, and planned diagnostics and interventions. Attending physician stated agreement with plan or made changes to plan which were implemented.   Reevaluation:  After the interventions noted above I re-evaluated patient and found that they have :improved    Problem List / ED Course: Patient presented for headache.  Patient stated that her headache is similar to others albeit worse than usual.  Also endorses photophobia and phonophobia.  Per chart review, patient has extensive history of migraines.  Doubt SAH, patient did not endorse this is the worst headache she is ever had in her life, patient is normotensive and no focal neurodeficits on exam.  Also consider meningitis but unlikely given no fever and no nuchal rigidity.  Also considered hypertensive emergency but unlikely given patient is normotensive denies visual disturbance.  Symptoms are consistent with ongoing migraine.  Treat her migraine with headache cocktail.  Upon reevaluation patient stated that she felt much better.  Did consider CT scan but given that her headache today is very similar to others and the fact the patient has close follow-up with neurology at [facility], we discussed that we could forego the head CT today.  Recommended that she would follow-up with her neurologist within the next week for reevaluation and review of her encounter today in the emergency department.  Discussed return precautions.  Dispostion:  After consideration of the diagnostic results and the patients response to treatment, I feel that the patient would benefit from discharge with close follow-up with her neurologist for ongoing migraines.         Final Clinical Impression(s) / ED Diagnoses Final diagnoses:  Intractable headache, unspecified chronicity pattern, unspecified headache type    Rx / DC Orders ED Discharge Orders     None         Harriet Pho, PA-C 11/06/21 1738    Milton Ferguson, MD 11/08/21 [phone]

## 2021-11-06 NOTE — ED Triage Notes (Signed)
Pt c/o migraine with nausea and light sensitivity that started this am, has hx of same,

## 2021-11-08 DIAGNOSIS — G43909 Migraine, unspecified, not intractable, without status migrainosus: Secondary | ICD-10-CM | POA: Diagnosis not present

## 2021-11-08 DIAGNOSIS — G4700 Insomnia, unspecified: Secondary | ICD-10-CM | POA: Diagnosis not present

## 2021-11-08 DIAGNOSIS — D529 Folate deficiency anemia, unspecified: Secondary | ICD-10-CM | POA: Diagnosis not present

## 2021-11-08 DIAGNOSIS — R030 Elevated blood-pressure reading, without diagnosis of hypertension: Secondary | ICD-10-CM | POA: Diagnosis not present

## 2021-11-08 DIAGNOSIS — Z6821 Body mass index (BMI) 21.0-21.9, adult: Secondary | ICD-10-CM | POA: Diagnosis not present

## 2021-11-08 DIAGNOSIS — D519 Vitamin B12 deficiency anemia, unspecified: Secondary | ICD-10-CM | POA: Diagnosis not present

## 2021-11-08 DIAGNOSIS — N3010 Interstitial cystitis (chronic) without hematuria: Secondary | ICD-10-CM | POA: Diagnosis not present

## 2021-11-17 DIAGNOSIS — N3010 Interstitial cystitis (chronic) without hematuria: Secondary | ICD-10-CM | POA: Diagnosis not present

## 2021-11-27 ENCOUNTER — Encounter (INDEPENDENT_AMBULATORY_CARE_PROVIDER_SITE_OTHER): Payer: Self-pay | Admitting: Gastroenterology

## 2021-11-28 ENCOUNTER — Other Ambulatory Visit: Payer: Self-pay | Admitting: Plastic Surgery

## 2021-11-28 DIAGNOSIS — M7989 Other specified soft tissue disorders: Secondary | ICD-10-CM | POA: Diagnosis not present

## 2021-11-28 DIAGNOSIS — L720 Epidermal cyst: Secondary | ICD-10-CM | POA: Diagnosis not present

## 2021-12-02 ENCOUNTER — Ambulatory Visit (HOSPITAL_COMMUNITY): Payer: Medicare HMO | Admitting: Psychiatry

## 2021-12-08 DIAGNOSIS — I959 Hypotension, unspecified: Secondary | ICD-10-CM | POA: Diagnosis not present

## 2021-12-08 DIAGNOSIS — G43019 Migraine without aura, intractable, without status migrainosus: Secondary | ICD-10-CM | POA: Diagnosis not present

## 2021-12-08 DIAGNOSIS — Z9889 Other specified postprocedural states: Secondary | ICD-10-CM | POA: Diagnosis not present

## 2021-12-08 DIAGNOSIS — Z9682 Presence of neurostimulator: Secondary | ICD-10-CM | POA: Diagnosis not present

## 2021-12-08 DIAGNOSIS — R0683 Snoring: Secondary | ICD-10-CM | POA: Diagnosis not present

## 2021-12-08 DIAGNOSIS — G4710 Hypersomnia, unspecified: Secondary | ICD-10-CM | POA: Diagnosis not present

## 2021-12-08 DIAGNOSIS — G4700 Insomnia, unspecified: Secondary | ICD-10-CM | POA: Diagnosis not present

## 2021-12-08 DIAGNOSIS — F32A Depression, unspecified: Secondary | ICD-10-CM | POA: Diagnosis not present

## 2021-12-08 DIAGNOSIS — I6523 Occlusion and stenosis of bilateral carotid arteries: Secondary | ICD-10-CM | POA: Diagnosis not present

## 2021-12-20 DIAGNOSIS — G43719 Chronic migraine without aura, intractable, without status migrainosus: Secondary | ICD-10-CM | POA: Diagnosis not present

## 2021-12-20 DIAGNOSIS — G43709 Chronic migraine without aura, not intractable, without status migrainosus: Secondary | ICD-10-CM | POA: Diagnosis not present

## 2022-01-05 DIAGNOSIS — G43011 Migraine without aura, intractable, with status migrainosus: Secondary | ICD-10-CM | POA: Diagnosis not present

## 2022-01-06 ENCOUNTER — Encounter (HOSPITAL_COMMUNITY): Payer: Self-pay

## 2022-01-06 DIAGNOSIS — R519 Headache, unspecified: Secondary | ICD-10-CM | POA: Diagnosis not present

## 2022-01-06 DIAGNOSIS — Z79899 Other long term (current) drug therapy: Secondary | ICD-10-CM | POA: Insufficient documentation

## 2022-01-06 DIAGNOSIS — E039 Hypothyroidism, unspecified: Secondary | ICD-10-CM | POA: Insufficient documentation

## 2022-01-06 DIAGNOSIS — G43909 Migraine, unspecified, not intractable, without status migrainosus: Secondary | ICD-10-CM | POA: Diagnosis not present

## 2022-01-06 NOTE — ED Triage Notes (Signed)
Pt reports migraine since 12/24.  Saw neurology yesterday and was given several medications that brought it down to a level 4 on the pain scale but has now worsened again.

## 2022-01-07 ENCOUNTER — Emergency Department (HOSPITAL_COMMUNITY)
Admission: EM | Admit: 2022-01-07 | Discharge: 2022-01-07 | Disposition: A | Discharge status: Home / Self Care | Payer: Medicare HMO | Attending: Emergency Medicine | Admitting: Emergency Medicine

## 2022-01-07 DIAGNOSIS — G43909 Migraine, unspecified, not intractable, without status migrainosus: Secondary | ICD-10-CM

## 2022-01-07 MED ORDER — DEXAMETHASONE SODIUM PHOSPHATE 10 MG/ML IJ SOLN
10.0000 mg | Freq: Once | INTRAMUSCULAR | Status: AC
  Administered 2022-01-07: 10 mg via INTRAVENOUS
  Filled 2022-01-07: qty 1

## 2022-01-07 MED ORDER — KETOROLAC TROMETHAMINE 30 MG/ML IJ SOLN
30.0000 mg | Freq: Once | INTRAMUSCULAR | Status: AC
  Administered 2022-01-07: 30 mg via INTRAVENOUS
  Filled 2022-01-07: qty 1

## 2022-01-07 MED ORDER — METOCLOPRAMIDE HCL 5 MG/ML IJ SOLN
10.0000 mg | Freq: Once | INTRAMUSCULAR | Status: AC
  Administered 2022-01-07: 10 mg via INTRAVENOUS
  Filled 2022-01-07: qty 2

## 2022-01-07 MED ORDER — SODIUM CHLORIDE 0.9 % IV BOLUS
1000.0000 mL | Freq: Once | INTRAVENOUS | Status: AC
  Administered 2022-01-07: 1000 mL via INTRAVENOUS

## 2022-01-07 MED ORDER — DIPHENHYDRAMINE HCL 50 MG/ML IJ SOLN
25.0000 mg | Freq: Once | INTRAMUSCULAR | Status: AC
  Administered 2022-01-07: 25 mg via INTRAVENOUS
  Filled 2022-01-07: qty 1

## 2022-01-07 NOTE — Discharge Instructions (Signed)
Continue medications as previously prescribed.  Follow-up with your urologist if your symptoms persist, and return to the ER if symptoms significantly worsen or change.

## 2022-01-07 NOTE — ED Provider Notes (Signed)
[facility] EMERGENCY DEPARTMENT Provider Note   CSN: [id number] Arrival date & time: 01/06/22  2100     History  Chief Complaint  Patient presents with   Migraine    Martha Aguilar is a 51 y.o. female.  Patient is a 51 year old female with past medical history of migraines, hypothyroidism, interstitial cystitis.  Patient presenting today with complaints of headache.  She has had this for the past 5 days.  She was seen by her neurologist yesterday and was given IV fluids and medications.  She achieved some relief, but headache has returned this evening and is worse than before.  She describes a pressure to the front of her head with no visual disturbances, weakness, numbness.  The history is provided by the patient.       Home Medications Prior to Admission medications   Medication Sig Start Date End Date Taking? Authorizing Provider  ALPRAZolam Duanne Moron) 0.5 MG tablet Take 0.5 mg by mouth 2 (two) times daily. 04/14/21   [provider]  amitriptyline (ELAVIL) 100 MG tablet Take 100 mg by mouth at bedtime. 04/25/21   [provider]  baclofen (LIORESAL) 20 MG tablet Take 20 mg by mouth 2 (two) times daily. 02/14/21   [provider]  Divalproex Sodium (DEPAKOTE PO) Take by mouth.    [provider]  EMGALITY 120 MG/ML SOAJ Inject 1 mL into the skin every 30 (thirty) days. 04/28/21   [provider]  gabapentin (NEURONTIN) 800 MG tablet Take 1 tablet (800 mg total) by mouth 3 (three) times daily. 06/15/20   Pieter Partridge, DO  hydrOXYzine (ATARAX) 50 MG tablet Take 100 mg by mouth 3 (three) times daily as needed. 04/25/21   [provider]  levothyroxine (SYNTHROID) 150 MCG tablet Take 150 mcg by mouth daily before breakfast. Only on Sat and Sunday    [provider]  levothyroxine (SYNTHROID) 175 MCG tablet Take 175 mcg by mouth every morning. Monday thru Friday 10/21/20   [provider]  MAGNESIUM PO Take 2,000 mg by  mouth daily.    [provider]  Melatonin 12 MG TABS Take by mouth daily. Patient not taking: Reported on 05/16/2021    [provider]  meloxicam (MOBIC) 15 MG tablet Take 1 tablet (15 mg total) by mouth daily. Patient not taking: Reported on 05/03/2021 11/08/20   Bjorn Loser, MD  memantine (NAMENDA) 10 MG tablet Take 10 mg by mouth 2 (two) times daily. 04/29/21   [provider]  metoCLOPramide (REGLAN) 10 MG tablet Take 1 tablet (10 mg total) by mouth every 6 (six) hours as needed for nausea (and headache). 05/06/21   Noemi Chapel, MD  Niacin (VITAMIN B-3 PO) Take 500 mg by mouth daily.    [provider]  nitrofurantoin (MACRODANTIN) 100 MG capsule Take 1 capsule (100 mg total) by mouth daily. Patient not taking: Reported on 05/03/2021 11/08/20   Bjorn Loser, MD  ondansetron (ZOFRAN) 8 MG tablet TAKE (1) TABLET BY MOUTH EVERY FOUR HOURS AS NEEDED. 12/09/19   Tomi Likens, Adam R, DO  ondansetron (ZOFRAN-ODT) 4 MG disintegrating tablet Take 4 mg by mouth every 8 (eight) hours as needed. 10/30/20   [provider]  PREMARIN 0.3 MG tablet Take 0.3 mg by mouth daily. 08/24/18   [provider]  progesterone (PROMETRIUM) 100 MG capsule daily. 08/24/18   [provider]  propranolol ER (INDERAL LA) 60 MG 24 hr capsule Take 60 mg by mouth daily. 04/12/21  [provider]  Rimegepant Sulfate (NURTEC) 75 MG TBDP TAKE 1 TABLET DAILY IF NEEDED. MAX 1 TABLET PER DAY. Patient not taking: Reported on 05/03/2021 02/10/20   Pieter Partridge, DO  SUMAtriptan (IMITREX) 100 MG tablet Take 1 tablet (100 mg total) by mouth every 2 (two) hours as needed for migraine. May repeat in 2 hours if headache persists or recurs. 06/13/21   Evalee Jefferson, PA-C  SUMAtriptan (TOSYMRA) 10 MG/ACT SOLN Place 10 mg into the nose every hour as needed (Maximum 3 sprays in 24 hours.). 12/09/19   Pieter Partridge, DO  SUMAtriptan 6 MG/0.5ML SOAJ Inject 6 mg into the skin as  needed (Migraine).  07/19/18   [provider]  valACYclovir (VALTREX) 1000 MG tablet Take 1 tablet (1,000 mg total) by mouth 2 (two) times daily. 05/09/17   Cresenzo-Dishmon, Joaquim Lai, CNM  vitamin C (ASCORBIC ACID) 500 MG tablet Take 500 mg by mouth at bedtime.    [provider]      Allergies    Aimovig [erenumab], Other, Carbamazepine, Compazine [prochlorperazine], and Amoxicillin-pot clavulanate    Review of Systems   Review of Systems  All other systems reviewed and are negative.   Physical Exam Updated Vital Signs BP 112/72   Pulse 69   Temp 97.7 F (36.5 C) (Oral)   Resp 18   Ht '5\' 7"'$  (1.702 m)   Wt 63.5 kg   SpO2 100%   BMI 21.93 kg/m  Physical Exam Vitals and nursing note reviewed.  Constitutional:      General: She is not in acute distress.    Appearance: She is well-developed. She is not diaphoretic.  HENT:     Head: Normocephalic and atraumatic.  Cardiovascular:     Rate and Rhythm: Normal rate and regular rhythm.     Heart sounds: No murmur heard.    No friction rub. No gallop.  Pulmonary:     Effort: Pulmonary effort is normal. No respiratory distress.     Breath sounds: Normal breath sounds. No wheezing.  Abdominal:     General: Bowel sounds are normal. There is no distension.     Palpations: Abdomen is soft.     Tenderness: There is no abdominal tenderness.  Musculoskeletal:        General: Normal range of motion.     Cervical back: Normal range of motion and neck supple.  Skin:    General: Skin is warm and dry.  Neurological:     General: No focal deficit present.     Mental Status: She is alert and oriented to person, place, and time.     ED Results / Procedures / Treatments   Labs (all labs ordered are listed, but only abnormal results are displayed) Labs Reviewed - No data to display  EKG None  Radiology No results found.  Procedures Procedures    Medications Ordered in ED Medications  sodium chloride 0.9 %  bolus 1,000 mL (has no administration in time range)  ketorolac (TORADOL) 30 MG/ML injection 30 mg (has no administration in time range)  diphenhydrAMINE (BENADRYL) injection 25 mg (has no administration in time range)  dexamethasone (DECADRON) injection 10 mg (has no administration in time range)  metoCLOPramide (REGLAN) injection 10 mg (has no administration in time range)    ED Course/ Medical Decision Making/ A&P  Patient presenting today with complaints of headache as described in the HPI.  She has a history of migraines and this feels similar.  She arrives here  with stable vital signs and is neurologically intact.  There is no nuchal rigidity or other concerning findings.  IV fluids administered consisting of 1 L of normal saline along with a migraine cocktail consisting of Reglan, Benadryl, Decadron, and Toradol providing near complete relief.  She is now feeling better and wants to go home.  I see no indication for further workup.  There are no red flags that would suggest a more emergent condition.  Final Clinical Impression(s) / ED Diagnoses Final diagnoses:  None    Rx / DC Orders ED Discharge Orders     None         Veryl Speak, MD 01/07/22 [phone]

## 2022-01-26 DIAGNOSIS — G43709 Chronic migraine without aura, not intractable, without status migrainosus: Secondary | ICD-10-CM | POA: Diagnosis not present

## 2022-01-26 DIAGNOSIS — Z888 Allergy status to other drugs, medicaments and biological substances status: Secondary | ICD-10-CM | POA: Diagnosis not present

## 2022-01-26 DIAGNOSIS — G43909 Migraine, unspecified, not intractable, without status migrainosus: Secondary | ICD-10-CM | POA: Diagnosis not present

## 2022-01-26 DIAGNOSIS — Z880 Allergy status to penicillin: Secondary | ICD-10-CM | POA: Diagnosis not present

## 2022-03-09 DIAGNOSIS — G43709 Chronic migraine without aura, not intractable, without status migrainosus: Secondary | ICD-10-CM | POA: Diagnosis not present

## 2022-03-28 ENCOUNTER — Ambulatory Visit (INDEPENDENT_AMBULATORY_CARE_PROVIDER_SITE_OTHER): Payer: Medicare HMO | Admitting: Psychiatry

## 2022-03-28 DIAGNOSIS — F321 Major depressive disorder, single episode, moderate: Secondary | ICD-10-CM

## 2022-03-28 NOTE — Progress Notes (Signed)
IN- PERSON  THERAPIST PROGRESS NOTE  Session Time: Tuesday 03/28/2022 2:07 PM  -  2:58 PM   Participation Level: Active  Behavioral Response: CasualAlertAnxious/depressed/tearful  Type of Therapy: Individual Therapy  Treatment Goals addressed: Patient will score less than 10 on the patient health questionnaire (PHQ-9), patient will identify 3 cognitive patterns and beliefs that support depression  ProgressTowards Goals: Not Progressing  Interventions: CBT and Supportive  Summary: Martha Aguilar is a 52 y.o. female who is referred for services by PA Marcelle Overlie due to pt experiencing symptoms of anxiety and depression. She denies any psychiatric hospitalizations. She reports participated in marriage counseling about 10 years ago.  Patient reports chronic pain diagnoses of fibromyalgia, severe migraines and interstitial cystitis cause her a lot of anxiety and depression.  Current symptoms include excessive worry, fatigue, hopelessness, irritability, excessive sleeping, tearfulness, thoughts and feelings of hopelessness/worthlessness, and muscle tension.  Patient last was seen about 9 months ago.  She reports continued symptoms of depression as well as anxiety as reflected in the PHQ 2 and 9 and GAD-7.  Patient reports stress regarding continued health issues especially migraines as she is often unable to participate in activities.  She expresses guilt as she relies on her parents to help her with her 26 year old daughter when patient is experiencing severe migraines that typically occur 2-3 times per week.  Patient reports being unable to do anything except lying in bed.  She also reports sleep difficulty as she worked as a Marine scientist on the night shift for 23 years.  She has not worked in the hospital setting since 2017 but still reports difficulty sleeping at night.  Patient also reports stress regarding her daughter who has been in therapy since last year.  Per patient's report, daughter has diagnosis  of ADHD,ODD, and separation anxiety.  She reports daughter has exhibited significant behavioral problems in school and recently was placed on ISS for physically aggressive behavior.  Patient also reports stress related to daughter recently sending inappropriate pictures of self via text to a female last night who later shared pictures with other people at school.  She expresses frustration as she reports it is difficult to coparent with her child's father who also has anger issues.  Patient verbalizes should and ought statements regarding her role as a parent and expresses guilt about not being as involved with her daughter due to migraines.   Suicidal/Homicidal: Nowithout intent/plan  Therapist Response: Reviewed symptoms, discussed stressors, facilitated expression of thoughts and feelings, validated feelings, began to assist patient identify ways to increase behavioral activation within her capability, discussed pain level chart and activities patient could pursue at different pain levels, assisted patient identify realistic expectations of self at each level, assisted patient identify possible activities she could do with her daughter and discussed staying in the den more as opposed to lying in bed, praised and reinforced patient's use of her support system and discussed ways to set maintain limits, also discussed referral to psychiatrist Dr. Nehemiah Settle for medication evaluation   Plan: Return again in 1-2 weeks.  Diagnosis: Major depressive disorder, single episode, moderate ([place])  Collaboration of Care: Primary Care Provider AEB encouraging patient to work with PCP for medication management at this time.  Referral will be made to psychiatrist who will join this practice in the next couple of months  Patient/Guardian was advised Release of Information must be obtained prior to any record release in order to collaborate their care with an outside provider. Patient/Guardian was advised if  they have not  already done so to contact the registration department to sign all necessary forms in order for [name] to release information regarding their care.   Consent: Patient/Guardian gives verbal consent for treatment and assignment of benefits for services provided during this visit. Patient/Guardian expressed understanding and agreed to proceed.   Alonza Smoker, LCSW 03/28/2022

## 2022-05-11 ENCOUNTER — Ambulatory Visit (INDEPENDENT_AMBULATORY_CARE_PROVIDER_SITE_OTHER): Payer: Medicare HMO | Admitting: Psychiatry

## 2022-05-11 DIAGNOSIS — G43719 Chronic migraine without aura, intractable, without status migrainosus: Secondary | ICD-10-CM | POA: Diagnosis not present

## 2022-05-11 DIAGNOSIS — G43E19 Chronic migraine with aura, intractable, without status migrainosus: Secondary | ICD-10-CM | POA: Diagnosis not present

## 2022-05-11 DIAGNOSIS — F321 Major depressive disorder, single episode, moderate: Secondary | ICD-10-CM

## 2022-05-11 NOTE — Progress Notes (Signed)
IN- PERSON  THERAPIST PROGRESS NOTE  Session Time: Thursday 05/11/2022 4:10 PM  - 4:47 PM  Participation Level: Active  Behavioral Response: CasualAlertAnxious/depressed/tearful  Type of Therapy: Individual Therapy  Treatment Goals addressed: Patient will score less than 10 on the patient health questionnaire (PHQ-9), patient will identify 3 cognitive patterns and beliefs that support depression  ProgressTowards Goals: Not Progressing  Interventions: CBT and Supportive  Summary: Martha Aguilar is a 52 y.o. female who is referred for services by PA Bunnie Pion due to pt experiencing symptoms of anxiety and depression. She denies any psychiatric hospitalizations. She reports participated in marriage counseling about 10 years ago.  Patient reports chronic pain diagnoses of fibromyalgia, severe migraines and interstitial cystitis cause her a lot of anxiety and depression.  Current symptoms include excessive worry, fatigue, hopelessness, irritability, excessive sleeping, tearfulness, thoughts and feelings of hopelessness/worthlessness, and muscle tension.  Patient last was seen about 5-6 weeks ago.  She reports continued symptoms of depression  as reflected in the PHQ 2 and 9 but decreased intensity and duration. She also experiences symptoms of anxiety but reports decreased worry particularly related to her daughter.  Per patient's report, there has been significant improvement in their relationship.  She is his daughter is more respectful.  She also reports daughter has improved her grades in school.  Patient reports implementing strategies discussed in session regarding using pain and activity level chart.  As a result, patient reports picking up daughter from school more frequently rather than asking her parents to pick up daughter.  She also reports being more engaged with her daughter and doing such activities as preparing meals together.  Patient also reports making more efforts to stay in the  den with daughter rather than just lying in the bed.patient is very pleased with her efforts and expresses increased confidence in coping with migraines.  She also expresses less guilt now that she spends more time with her daughter.  She also reports decreased stress regarding bladder issues that she has found a medication regimen that has reduced some of her issues.  Patient expresses sadness regarding the abrupt end of a renewed relationship with a man she dated 22 years go. They recently reconnected but the man abruptly ended their relationship after 6 weeks. Patient continued to express concerns regarding current medications and is requesting medication evaluation.    Suicidal/Homicidal: Nowithout intent/plan  Therapist Response: Reviewed symptoms, administered  PHQ 2 and 9, discussed results, praised and  reinforced patient's implementation of strategies discussed last session, discussed effects on her thoughts/mood/behavior, encouraged patient to maintain consistent efforts, discussed stressors, facilitated expression of thoughts and feelings, validated feelings, discussed referral to psychiatrist Dr. Adrian Blackwater for medication evaluation    Plan: Return again in 1-2 weeks.  Diagnosis: Major depressive disorder, single episode, moderate (HCC)  Collaboration of Care: Primary Care Provider AEB encouraging patient to work with PCP for medication management at this time.  Referral will be made to psychiatrist who will join this practice in the next couple of months  Patient/Guardian was advised Release of Information must be obtained prior to any record release in order to collaborate their care with an outside provider. Patient/Guardian was advised if they have not already done so to contact the registration department to sign all necessary forms in order for [name] to release information regarding their care.   Consent: Patient/Guardian gives verbal consent for treatment and assignment of benefits for  services provided during this visit. Patient/Guardian expressed understanding and agreed to  proceed.   Adah Salvage, LCSW 05/11/2022

## 2022-05-23 DIAGNOSIS — G43E19 Chronic migraine with aura, intractable, without status migrainosus: Secondary | ICD-10-CM | POA: Diagnosis not present

## 2022-05-23 DIAGNOSIS — G43719 Chronic migraine without aura, intractable, without status migrainosus: Secondary | ICD-10-CM | POA: Diagnosis not present

## 2022-05-25 ENCOUNTER — Ambulatory Visit (HOSPITAL_COMMUNITY): Payer: Medicare HMO | Admitting: Psychiatry

## 2022-05-25 DIAGNOSIS — N3010 Interstitial cystitis (chronic) without hematuria: Secondary | ICD-10-CM | POA: Diagnosis not present

## 2022-06-08 ENCOUNTER — Ambulatory Visit (INDEPENDENT_AMBULATORY_CARE_PROVIDER_SITE_OTHER): Payer: Medicare HMO | Admitting: Psychiatry

## 2022-06-08 DIAGNOSIS — F321 Major depressive disorder, single episode, moderate: Secondary | ICD-10-CM

## 2022-06-08 NOTE — Progress Notes (Signed)
IN- PERSON  THERAPIST PROGRESS NOTE  Session Time: Thursday 06/08/2022 4:17 PM  - 5:00 PM   Participation Level: Active  Behavioral Response: CasualAlertAnxious/  Type of Therapy: Individual Therapy  Treatment Goals addressed: Patient will score less than 10 on the patient health questionnaire (PHQ-9), patient will identify 3 cognitive patterns and beliefs that support depression  ProgressTowards Goals: Progressing  Interventions: CBT and Supportive  Summary: CYNDRA GUNDY is a 52 y.o. female who is referred for services by PA Bunnie Pion due to pt experiencing symptoms of anxiety and depression. She denies any psychiatric hospitalizations. She reports participated in marriage counseling about 10 years ago.  Patient reports chronic pain diagnoses of fibromyalgia, severe migraines and interstitial cystitis cause her a lot of anxiety and depression.  Current symptoms include excessive worry, fatigue, hopelessness, irritability, excessive sleeping, tearfulness, thoughts and feelings of hopelessness/worthlessness, and muscle tension.  Patient last was seen about 4-5 weeks ago.  She reports much improved mood and decreased stress since last session.  She attributes this to decreased intensity and frequency migraines.  Per patient's report, she has had only 2 migraines in the past month.  She reports working with neurologist and recently starting a medication she used 30 years ago that was not helpful at the time but now is helpful.  Patient is very pleased about this and reports increased energy, improved mood, increased involvement in activities, and increased motivation.  She is very pleased with the positive effects this has had on her relationship with her daughter.  She has been doing more activities with daughter.  Patient reports now singing and dancing around the house.  She also planted a garden.  She has resumed attendance at church and says she had not been to church in years prior to this.   She also recently went  to the beach with her friend.  However, she reports having a severe panic attack while at the beach but managed using deep breathing and support from her friend.  Patient is pleased with the effects of her current medication on the reduction of migraines but also expresses fear migraines may return.  Patient reports ruminating thoughts about this.     Suicidal/Homicidal: Nowithout intent/plan  Therapist Response: Reviewed symptoms, administered  PHQ 2, discussed results, facilitated patient expressing thoughts and feelings about taking new medication and the effects on her functioning, praised and reinforced patient's increased involvement in activity as well as socialization, discussed effects, discussed rationale for and assisted patient practice leaves on a stream exercise to cope with ruminating thoughts, developed plan with patient to practice daily, checked out interactive audio activity to patient and provided with access code, encouraged patient to follow through with mid evaluation appointment with psychiatrist Dr. Adrian Blackwater.    Plan: Return again in 1-2 weeks.  Diagnosis: Major depressive disorder, single episode, moderate (HCC)  Collaboration of Care: Primary Care Provider AEB encouraging patient to work with PCP for medication management at this time.  Referral will be made to psychiatrist who will join this practice in the next couple of months  Patient/Guardian was advised Release of Information must be obtained prior to any record release in order to collaborate their care with an outside provider. Patient/Guardian was advised if they have not already done so to contact the registration department to sign all necessary forms in order for [name] to release information regarding their care.   Consent: Patient/Guardian gives verbal consent for treatment and assignment of benefits for services provided during this visit.  Patient/Guardian expressed understanding and agreed  to proceed.   Adah Salvage, LCSW 06/08/2022

## 2022-06-13 DIAGNOSIS — S90111A Contusion of right great toe without damage to nail, initial encounter: Secondary | ICD-10-CM | POA: Diagnosis not present

## 2022-06-13 DIAGNOSIS — Z6823 Body mass index (BMI) 23.0-23.9, adult: Secondary | ICD-10-CM | POA: Diagnosis not present

## 2022-06-13 DIAGNOSIS — M79676 Pain in unspecified toe(s): Secondary | ICD-10-CM | POA: Diagnosis not present

## 2022-06-13 DIAGNOSIS — R030 Elevated blood-pressure reading, without diagnosis of hypertension: Secondary | ICD-10-CM | POA: Diagnosis not present

## 2022-06-15 ENCOUNTER — Ambulatory Visit (INDEPENDENT_AMBULATORY_CARE_PROVIDER_SITE_OTHER): Payer: Medicare HMO | Admitting: Psychiatry

## 2022-06-15 ENCOUNTER — Encounter (HOSPITAL_COMMUNITY): Payer: Self-pay | Admitting: Psychiatry

## 2022-06-15 DIAGNOSIS — F410 Panic disorder [episodic paroxysmal anxiety] without agoraphobia: Secondary | ICD-10-CM

## 2022-06-15 DIAGNOSIS — Z758 Other problems related to medical facilities and other health care: Secondary | ICD-10-CM | POA: Insufficient documentation

## 2022-06-15 DIAGNOSIS — Z01419 Encounter for gynecological examination (general) (routine) without abnormal findings: Secondary | ICD-10-CM | POA: Diagnosis not present

## 2022-06-15 DIAGNOSIS — F4310 Post-traumatic stress disorder, unspecified: Secondary | ICD-10-CM

## 2022-06-15 DIAGNOSIS — G43709 Chronic migraine without aura, not intractable, without status migrainosus: Secondary | ICD-10-CM | POA: Diagnosis not present

## 2022-06-15 DIAGNOSIS — F3341 Major depressive disorder, recurrent, in partial remission: Secondary | ICD-10-CM

## 2022-06-15 DIAGNOSIS — N3010 Interstitial cystitis (chronic) without hematuria: Secondary | ICD-10-CM

## 2022-06-15 DIAGNOSIS — F411 Generalized anxiety disorder: Secondary | ICD-10-CM

## 2022-06-15 DIAGNOSIS — R0683 Snoring: Secondary | ICD-10-CM | POA: Insufficient documentation

## 2022-06-15 DIAGNOSIS — G4709 Other insomnia: Secondary | ICD-10-CM

## 2022-06-15 DIAGNOSIS — Z6823 Body mass index (BMI) 23.0-23.9, adult: Secondary | ICD-10-CM | POA: Diagnosis not present

## 2022-06-15 DIAGNOSIS — G4700 Insomnia, unspecified: Secondary | ICD-10-CM | POA: Diagnosis not present

## 2022-06-15 DIAGNOSIS — Z789 Other specified health status: Secondary | ICD-10-CM

## 2022-06-15 DIAGNOSIS — F324 Major depressive disorder, single episode, in partial remission: Secondary | ICD-10-CM | POA: Insufficient documentation

## 2022-06-15 DIAGNOSIS — M5481 Occipital neuralgia: Secondary | ICD-10-CM

## 2022-06-15 DIAGNOSIS — N959 Unspecified menopausal and perimenopausal disorder: Secondary | ICD-10-CM | POA: Diagnosis not present

## 2022-06-15 DIAGNOSIS — Z1231 Encounter for screening mammogram for malignant neoplasm of breast: Secondary | ICD-10-CM | POA: Diagnosis not present

## 2022-06-15 MED ORDER — TRAZODONE HCL 100 MG PO TABS: 100.0000 mg | ORAL_TABLET | Freq: Every day | ORAL | 1 refills | Status: DC
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
## 2022-06-15 NOTE — Patient Instructions (Signed)
We did not make any medication changes today in favor of doing more medical workup and then seeing if other changes were needed.  We continue trazodone 100 mg nightly for now but over time we may look to consolidate this into just the amitriptyline versus discontinuing amitriptyline in favor of starting Cymbalta.  Please contact your endocrinologist to get a TSH, free T4, total T3 checked to make sure we are not missing uncontrolled hyperthyroidism as the cause for your recent panic attacks.  And if you can call the number provided by my front desk staff to get established with primary care in the same building we will plan on getting a vitamin D, B12, folate, CBC, CMP, iron panel to rule out other physical processes that could be occurring.  They can also order a sleep study once they see you as it is possible that you could have sleep apnea but would overall benefit from seeing a sleep physician for your insomnia.  You can consider beginning the taper of the Ambien by speaking with her gynecologist.  I would recommend by decreasing by 2.5 mg every month as well as decreasing the amount of caffeine you are consuming by half a cup of coffee every 5 days to prevent withdrawal headaches.

## 2022-06-26 DIAGNOSIS — M79672 Pain in left foot: Secondary | ICD-10-CM | POA: Diagnosis not present

## 2022-06-26 DIAGNOSIS — S9032XA Contusion of left foot, initial encounter: Secondary | ICD-10-CM | POA: Diagnosis not present

## 2022-07-05 ENCOUNTER — Ambulatory Visit (INDEPENDENT_AMBULATORY_CARE_PROVIDER_SITE_OTHER): Payer: Medicare HMO | Admitting: Psychiatry

## 2022-07-05 DIAGNOSIS — F321 Major depressive disorder, single episode, moderate: Secondary | ICD-10-CM

## 2022-07-05 NOTE — Progress Notes (Signed)
IN- PERSON  THERAPIST PROGRESS NOTE  Session Time: Wednesday  07/05/2022 4:17 PM - 5:00 PM   Participation Level: Active  Behavioral Response: CasualAlertAnxious/  Type of Therapy: Individual Therapy  Treatment Goals addressed: Patient will score less than 10 on the patient health questionnaire (PHQ-9), patient will identify 3 cognitive patterns and beliefs that support depression  ProgressTowards Goals: Progressing  Interventions: CBT and Supportive  Summary: MAYUKO HOTTENSTEIN is a 52 y.o. female who is referred for services by PA Bunnie Pion due to pt experiencing symptoms of anxiety and depression. She denies any psychiatric hospitalizations. She reports participated in marriage counseling about 10 years ago.  Patient reports chronic pain diagnoses of fibromyalgia, severe migraines and interstitial cystitis cause her a lot of anxiety and depression.  Current symptoms include excessive worry, fatigue, hopelessness, irritability, excessive sleeping, tearfulness, thoughts and feelings of hopelessness/worthlessness, and muscle tension.  Patient last was seen about 4-5 weeks ago.  She reports continued improved mood since last session.  She continues to report positive results from medication to treat her migraines.  Per her report, she has had only about 3-4 migraines since last session and reports these have been manageable.  Per patient's report, she no longer has fear or ruminating thoughts about migraines returning in frequency and intensity.  She reports she did practice leaves on a stream exercise.  She continues to report increased energy, increased involvement in activities, and increased motivation.  She continues to do household projects, working her garden, [employer], and socialize with family/friends.  Per patient's report, she has renewed contact with her sister and has initiated phone calls.  She reports continued positive relationship with her daughter.  Patient also is pleased  regarding her relationship with a female friend.  She reports increased stress regarding recent interaction with her ex-husband regarding removing his vehicle from her property.  However, she is pleased she was able to use assertiveness skills and set limits with ex-husband.     Suicidal/Homicidal: Nowithout intent/plan  Therapist Response: Reviewed symptoms, praised and reinforced patient's continued increased behavioral activation/socialization, developed plan with patient to maintain consistency, discussed stressors, facilitated expression of thoughts and feelings, validated feelings, praised and reinforced patient's efforts to use assertiveness skills, discussed the effects of use on patient's mood/thoughts/behavior, began to discuss patient's trauma history and possible effects on current functioning  Plan: Return again in 1-2 weeks.  Diagnosis: Major depressive disorder, single episode, moderate (HCC)  Collaboration of Care: Primary Care Provider AEB encouraging patient to work with PCP for medication management at this time.  Referral will be made to psychiatrist who will join this practice in the next couple of months  Patient/Guardian was advised Release of Information must be obtained prior to any record release in order to collaborate their care with an outside provider. Patient/Guardian was advised if they have not already done so to contact the registration department to sign all necessary forms in order for [name] to release information regarding their care.   Consent: Patient/Guardian gives verbal consent for treatment and assignment of benefits for services provided during this visit. Patient/Guardian expressed understanding and agreed to proceed.   Adah Salvage, LCSW 07/05/2022

## 2022-07-06 ENCOUNTER — Ambulatory Visit (INDEPENDENT_AMBULATORY_CARE_PROVIDER_SITE_OTHER): Payer: Medicare HMO | Admitting: Family Medicine

## 2022-07-06 ENCOUNTER — Encounter: Payer: Self-pay | Admitting: Family Medicine

## 2022-07-06 VITALS — BP 121/77 | HR 86 | Temp 97.4°F | Ht 67.0 in | Wt 142.0 lb

## 2022-07-06 DIAGNOSIS — Z13220 Encounter for screening for lipoid disorders: Secondary | ICD-10-CM

## 2022-07-06 DIAGNOSIS — L853 Xerosis cutis: Secondary | ICD-10-CM | POA: Diagnosis not present

## 2022-07-06 DIAGNOSIS — G4730 Sleep apnea, unspecified: Secondary | ICD-10-CM

## 2022-07-06 DIAGNOSIS — Z114 Encounter for screening for human immunodeficiency virus [HIV]: Secondary | ICD-10-CM

## 2022-07-06 DIAGNOSIS — E538 Deficiency of other specified B group vitamins: Secondary | ICD-10-CM | POA: Diagnosis not present

## 2022-07-06 DIAGNOSIS — G4709 Other insomnia: Secondary | ICD-10-CM | POA: Diagnosis not present

## 2022-07-06 DIAGNOSIS — G43809 Other migraine, not intractable, without status migrainosus: Secondary | ICD-10-CM | POA: Diagnosis not present

## 2022-07-06 DIAGNOSIS — Z1159 Encounter for screening for other viral diseases: Secondary | ICD-10-CM

## 2022-07-06 DIAGNOSIS — E559 Vitamin D deficiency, unspecified: Secondary | ICD-10-CM

## 2022-07-06 DIAGNOSIS — Z1329 Encounter for screening for other suspected endocrine disorder: Secondary | ICD-10-CM

## 2022-07-06 DIAGNOSIS — R0683 Snoring: Secondary | ICD-10-CM

## 2022-07-06 DIAGNOSIS — Z131 Encounter for screening for diabetes mellitus: Secondary | ICD-10-CM

## 2022-07-06 NOTE — Patient Instructions (Signed)

## 2022-07-06 NOTE — Assessment & Plan Note (Signed)
Explained to go to bed at the same time each night and get up at the same time each morning, including on the weekends. Make sure your bedroom is quiet, dark, relaxing, and at a comfortable temperature. Remove electronic devices, such as TVs, computers, and smart phones, from the bedroom.   

## 2022-07-06 NOTE — Assessment & Plan Note (Signed)
STOP Bang score 4 points  Intermediate Risk for moderate to severe OSA Referral placed for sleep study

## 2022-07-06 NOTE — Assessment & Plan Note (Signed)
Advise methods include deep breathing, cognitive behavioral therapy focusing on changing thoughts, Limit or avoid alcohol, caffeine, chocolate, canner foods, MSG and aspartame.  Implement sleep hygiene includes not watching TV or listen music in bed, don't eat heavy meals within a couple of hours of bedtime, don't use your phone, laptop, or tablet at bedtime

## 2022-07-06 NOTE — Progress Notes (Signed)
New Patient Office Visit   Subjective   Patient ID: Martha Aguilar, female    DOB: 1970-05-10  Age: 53 y.o. MRN: [id number]  CC:  Chief Complaint  Patient presents with   Establish Care    [name] 52 year old female, presents to establish care. She  has a past medical history of Constipation (09/05/2018), Dehydration (10/19/2011), Fibromuscular dysplasia (HCC) (08/25/2020), Headache(784.0), Hypothyroidism, IBS (irritable bowel syndrome) (11/16/2020), Interstitial cystitis (09/14/2020), Migraines, Occipital neuralgia, Raynaud disease (02/16/2021), and Venous stenosis of upper extremity.For the details of today's visit, please refer to assessment and plan.   HPI    Outpatient Encounter Medications as of 07/06/2022  Medication Sig   amitriptyline (ELAVIL) 100 MG tablet Take 100 mg by mouth at bedtime.   estradiol (ESTRACE) 1 MG tablet Take 1 tablet by mouth daily.   gabapentin (NEURONTIN) 800 MG tablet Take 1 tablet (800 mg total) by mouth 3 (three) times daily.   levothyroxine (SYNTHROID) 150 MCG tablet Take 150 mcg by mouth daily before breakfast. Only on Sat and Sunday   levothyroxine (SYNTHROID) 175 MCG tablet Take 175 mcg by mouth every morning. Monday thru Friday   OnabotulinumtoxinA (BOTOX IJ) Inject into the skin. Migraine prn   ondansetron (ZOFRAN-ODT) 4 MG disintegrating tablet Take 4 mg by mouth every 8 (eight) hours as needed.   progesterone (PROMETRIUM) 100 MG capsule daily.   SUMAtriptan (IMITREX) 100 MG tablet Take 1 tablet (100 mg total) by mouth every 2 (two) hours as needed for migraine. May repeat in 2 hours if headache persists or recurs.   SUMAtriptan (TOSYMRA) 10 MG/ACT SOLN Place 10 mg into the nose every hour as needed (Maximum 3 sprays in 24 hours.).   SUMAtriptan 6 MG/0.5ML SOAJ Inject 6 mg into the skin as needed (Migraine).    topiramate (TOPAMAX) 25 MG tablet Take 100 mg by mouth at bedtime.   traZODone (DESYREL) 100 MG tablet Take 1 tablet  (100 mg total) by mouth at bedtime.   valACYclovir (VALTREX) 1000 MG tablet Take 1 tablet (1,000 mg total) by mouth 2 (two) times daily.   zolpidem (AMBIEN CR) 12.5 MG CR tablet Take 12.5 mg by mouth at bedtime.   No facility-administered encounter medications on file as of 07/06/2022.    Past Surgical History:  Procedure Laterality Date   BIOPSY  10/23/2018   Procedure: BIOPSY;  Surgeon: Malissa Hippo, MD;  Location: [facility];  Service: Endoscopy;;   COLONOSCOPY N/A 10/23/2018   Procedure: COLONOSCOPY;  Surgeon: Malissa Hippo, MD;  Location: [facility];  Service: Endoscopy;  Laterality: N/A;  1:00   Decompression Migraine surgery  10/16/2017   ESOPHAGOGASTRODUODENOSCOPY (EGD) WITH PROPOFOL N/A 06/13/2018   Procedure: ESOPHAGOGASTRODUODENOSCOPY (EGD) WITH PROPOFOL;  Surgeon: Malissa Hippo, MD;  Location: [facility];  Service: Endoscopy;  Laterality: N/A;   INSERTION / PLACEMENT / REVISION NEUROSTIMULATOR  02/18/2019   POLYPECTOMY  10/23/2018   Procedure: POLYPECTOMY;  Surgeon: Malissa Hippo, MD;  Location: [facility];  Service: Endoscopy;;   TUBAL LIGATION     uterine ablation     uterine polyps      Review of Systems  Constitutional:  Negative for chills and fever.  Eyes:  Negative for blurred vision.  Respiratory:  Negative for shortness of breath.   Cardiovascular:  Negative for chest pain.  Gastrointestinal:  Negative for abdominal pain.  Genitourinary:  Negative for dysuria.  Musculoskeletal:  Negative for myalgias.  Neurological:  Positive for headaches.      Objective    BP 121/77   Pulse 86   Temp (!) 97.4 F (36.3 C) (Oral)   Ht 5\' 7"  (1.702 m)   Wt 142 lb (64.4 kg)   SpO2 94%   BMI 22.24 kg/m   Physical Exam Vitals reviewed.  Constitutional:      General: She is not in acute distress.    Appearance: Normal appearance. She is not ill-appearing, toxic-appearing or diaphoretic.  HENT:     Head: Normocephalic.  Eyes:     General:         Right eye: No discharge.        Left eye: No discharge.     Conjunctiva/sclera: Conjunctivae normal.  Cardiovascular:     Rate and Rhythm: Normal rate.     Pulses: Normal pulses.     Heart sounds: Normal heart sounds.  Pulmonary:     Effort: Pulmonary effort is normal. No respiratory distress.     Breath sounds: Normal breath sounds.  Abdominal:     General: Bowel sounds are normal.     Palpations: Abdomen is soft.     Tenderness: There is no abdominal tenderness. There is no right CVA tenderness, left CVA tenderness or guarding.  Musculoskeletal:        General: Normal range of motion.     Cervical back: Normal range of motion.  Skin:    General: Skin is warm and dry.     Capillary Refill: Capillary refill takes less than 2 seconds.  Neurological:     General: No focal deficit present.     Mental Status: She is alert and oriented to person, place, and time.     Coordination: Coordination normal.     Gait: Gait normal.  Psychiatric:        Mood and Affect: Mood normal.        Behavior: Behavior normal.       Assessment & Plan:  Sleep apnea, unspecified type -     Ambulatory referral to Sleep Studies  Screening for diabetes mellitus -     Microalbumin / creatinine urine ratio -     Hemoglobin A1c  Need for hepatitis C screening test -     Hepatitis C antibody  Vitamin D deficiency -     VITAMIN D 25 Hydroxy (Vit-D Deficiency, Fractures)  Encounter for screening for HIV -     HIV Antibody (routine testing w rflx)  Screening for thyroid disorder -     TSH + free T4  Screening for lipid disorders -     Lipid panel -     CMP14+EGFR -     CBC with Differential/Platelet  Vitamin B 12 deficiency -     Vitamin B12  Dry skin -     ANA w/Reflex  Snoring rule out sleep apnea Assessment & Plan: STOP Bang score 4 points  Intermediate Risk for moderate to severe OSA Referral placed for sleep study   Other insomnia Assessment & Plan: Explained to go to  bed at the same time each night and get up at the same time each morning, including on the weekends. Make sure your bedroom is quiet, dark, relaxing, and at a comfortable temperature. Remove electronic devices, such as TVs, computers, and smart phones, from the bedroom.    Other migraine without status migrainosus, not intractable Assessment & Plan: Advise methods include deep breathing, cognitive behavioral therapy focusing on changing thoughts, Limit or avoid  alcohol, caffeine, chocolate, canner foods, MSG and aspartame.  Implement sleep hygiene includes not watching TV or listen music in bed, don't eat heavy meals within a couple of hours of bedtime, don't use your phone, laptop, or tablet at bedtime       Return in about 6 months (around 01/05/2023), or if symptoms worsen or fail to improve, for routine labs.   Cruzita Lederer Newman Nip, FNP

## 2022-07-18 DIAGNOSIS — E039 Hypothyroidism, unspecified: Secondary | ICD-10-CM | POA: Diagnosis not present

## 2022-07-20 ENCOUNTER — Telehealth (INDEPENDENT_AMBULATORY_CARE_PROVIDER_SITE_OTHER): Payer: Medicare HMO | Admitting: Psychiatry

## 2022-07-20 ENCOUNTER — Encounter (HOSPITAL_COMMUNITY): Payer: Self-pay | Admitting: Psychiatry

## 2022-07-20 DIAGNOSIS — F4310 Post-traumatic stress disorder, unspecified: Secondary | ICD-10-CM

## 2022-07-20 DIAGNOSIS — G4709 Other insomnia: Secondary | ICD-10-CM

## 2022-07-20 DIAGNOSIS — F410 Panic disorder [episodic paroxysmal anxiety] without agoraphobia: Secondary | ICD-10-CM

## 2022-07-20 DIAGNOSIS — R0683 Snoring: Secondary | ICD-10-CM

## 2022-07-20 DIAGNOSIS — F411 Generalized anxiety disorder: Secondary | ICD-10-CM | POA: Diagnosis not present

## 2022-07-20 DIAGNOSIS — G43709 Chronic migraine without aura, not intractable, without status migrainosus: Secondary | ICD-10-CM

## 2022-07-20 DIAGNOSIS — F3341 Major depressive disorder, recurrent, in partial remission: Secondary | ICD-10-CM
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
## 2022-07-20 NOTE — Patient Instructions (Signed)
We will plan for cross taper at a low-dose: Decrease trazodone to 50 mg nightly for 1 week then plan on discontinuing.  Increase amitriptyline to 100 mg nightly and once off of trazodone if you need to increase to 125 mg or 150 mg nightly you may do so.  Once we can establish this is effective for your sleep then you can continue the monthly 2.5 mg decrease of the Ambien.  Keep up the good work with cutting back on caffeine and when you get a chance to get the blood work from PCP.

## 2022-07-31 DIAGNOSIS — E039 Hypothyroidism, unspecified: Secondary | ICD-10-CM | POA: Diagnosis not present

## 2022-08-10 ENCOUNTER — Ambulatory Visit (HOSPITAL_COMMUNITY): Payer: Medicare HMO | Admitting: Psychiatry

## 2022-08-16 DIAGNOSIS — G43719 Chronic migraine without aura, intractable, without status migrainosus: Secondary | ICD-10-CM | POA: Diagnosis not present

## 2022-08-16 DIAGNOSIS — R519 Headache, unspecified: Secondary | ICD-10-CM | POA: Diagnosis not present

## 2022-08-21 DIAGNOSIS — L853 Xerosis cutis: Secondary | ICD-10-CM | POA: Diagnosis not present

## 2022-08-21 DIAGNOSIS — Z114 Encounter for screening for human immunodeficiency virus [HIV]: Secondary | ICD-10-CM | POA: Diagnosis not present

## 2022-08-21 DIAGNOSIS — Z131 Encounter for screening for diabetes mellitus: Secondary | ICD-10-CM | POA: Diagnosis not present

## 2022-08-21 DIAGNOSIS — E559 Vitamin D deficiency, unspecified: Secondary | ICD-10-CM | POA: Diagnosis not present

## 2022-08-21 DIAGNOSIS — Z1329 Encounter for screening for other suspected endocrine disorder: Secondary | ICD-10-CM | POA: Diagnosis not present

## 2022-08-21 DIAGNOSIS — Z1159 Encounter for screening for other viral diseases: Secondary | ICD-10-CM | POA: Diagnosis not present

## 2022-08-21 DIAGNOSIS — Z13220 Encounter for screening for lipoid disorders: Secondary | ICD-10-CM | POA: Diagnosis not present

## 2022-08-24 ENCOUNTER — Telehealth (HOSPITAL_COMMUNITY): Payer: Self-pay | Admitting: Psychiatry

## 2022-08-24 ENCOUNTER — Ambulatory Visit (HOSPITAL_COMMUNITY): Payer: Medicare HMO | Admitting: Psychiatry

## 2022-08-24 ENCOUNTER — Emergency Department (HOSPITAL_COMMUNITY)
Admission: EM | Admit: 2022-08-24 | Discharge: 2022-08-24 | Disposition: A | Discharge status: Home / Self Care | Payer: Medicare HMO | Attending: Emergency Medicine | Admitting: Emergency Medicine

## 2022-08-24 ENCOUNTER — Encounter (HOSPITAL_COMMUNITY): Payer: Self-pay

## 2022-08-24 DIAGNOSIS — R519 Headache, unspecified: Secondary | ICD-10-CM | POA: Insufficient documentation

## 2022-08-24 DIAGNOSIS — G43909 Migraine, unspecified, not intractable, without status migrainosus: Secondary | ICD-10-CM | POA: Diagnosis present

## 2022-08-24 MED ORDER — SODIUM CHLORIDE 0.9 % IV BOLUS
500.0000 mL | Freq: Once | INTRAVENOUS | Status: AC
  Administered 2022-08-24: 500 mL via INTRAVENOUS

## 2022-08-24 MED ORDER — DEXAMETHASONE SODIUM PHOSPHATE 10 MG/ML IJ SOLN
10.0000 mg | Freq: Once | INTRAMUSCULAR | Status: AC
  Administered 2022-08-24: 10 mg via INTRAVENOUS
  Filled 2022-08-24: qty 1

## 2022-08-24 MED ORDER — METOCLOPRAMIDE HCL 5 MG/ML IJ SOLN
10.0000 mg | Freq: Once | INTRAMUSCULAR | Status: AC
  Administered 2022-08-24: 10 mg via INTRAVENOUS
  Filled 2022-08-24: qty 2

## 2022-08-24 MED ORDER — DIPHENHYDRAMINE HCL 50 MG/ML IJ SOLN
25.0000 mg | Freq: Once | INTRAMUSCULAR | Status: AC
  Administered 2022-08-24: 25 mg via INTRAVENOUS
  Filled 2022-08-24: qty 1

## 2022-08-24 MED ORDER — KETOROLAC TROMETHAMINE 15 MG/ML IJ SOLN
15.0000 mg | Freq: Once | INTRAMUSCULAR | Status: AC
  Administered 2022-08-24: 15 mg via INTRAVENOUS
  Filled 2022-08-24: qty 1

## 2022-08-24 MED ORDER — SODIUM CHLORIDE 0.9 % IV BOLUS: 1000.0000 mL | Freq: Once | INTRAVENOUS | Status: DC

## 2022-08-24 NOTE — ED Provider Notes (Signed)
[place] EMERGENCY DEPARTMENT AT [facility] Provider Note   CSN: [id number] Arrival date & time: 08/24/22  1034     History  Chief Complaint  Patient presents with   Migraine    Martha Aguilar is a 52 y.o. female.   Migraine   51 year old female presents emergency department with complaints of migraine.  Patient reports long history of migraines being on "every CGRP."  Patient states that she currently is on Topamax for treatment of her headache with recent increase in dosage over the past day or 2.  Reports headache over the past week.  States she has been trying at home Imitrex injections as well as pills, over-the-counter medications without significant relief.  States that headache feels similar to other headaches he was in the past.  Reports some frontal squeezing/pressure without radiation.  Denies any visual disturbance, gait abnormality, slurred speech, facial droop, weakness/sensory deficits in upper or lower extremities, fever, neck stiffness/pain.  States that she typically gets a migraine cocktail when her headaches last this long.  Does follow with neurology in the outpatient setting for headaches.  Past medical history significant for migraine, Raynaud's disease, fibromuscular dysplasia, occipital neuralgia, IBS, Home Medications Prior to Admission medications   Medication Sig Start Date End Date Taking? Authorizing Provider  amitriptyline (ELAVIL) 100 MG tablet Take 100 mg by mouth at bedtime. 04/25/21   [provider]  estradiol (ESTRACE) 1 MG tablet Take 1 tablet by mouth daily. 12/19/21   [provider]  gabapentin (NEURONTIN) 800 MG tablet Take 1 tablet (800 mg total) by mouth 3 (three) times daily. 06/15/20   Drema Dallas, DO  levothyroxine (SYNTHROID) 150 MCG tablet Take 150 mcg by mouth daily before breakfast. Only on Sat and Sunday    [provider]  levothyroxine (SYNTHROID) 175 MCG tablet Take 175 mcg by mouth every  morning. Monday thru Friday 10/21/20   [provider]  meloxicam (MOBIC) 7.5 MG tablet Take 7.5 mg by mouth 2 (two) times daily. 06/26/22   [provider]  OnabotulinumtoxinA (BOTOX IJ) Inject into the skin. Migraine prn    [provider]  ondansetron (ZOFRAN-ODT) 4 MG disintegrating tablet Take 4 mg by mouth every 8 (eight) hours as needed. 10/30/20   [provider]  progesterone (PROMETRIUM) 100 MG capsule daily. 08/24/18   [provider]  SUMAtriptan (IMITREX) 100 MG tablet Take 1 tablet (100 mg total) by mouth every 2 (two) hours as needed for migraine. May repeat in 2 hours if headache persists or recurs. 06/13/21   Burgess Amor, PA-C  SUMAtriptan (TOSYMRA) 10 MG/ACT SOLN Place 10 mg into the nose every hour as needed (Maximum 3 sprays in 24 hours.). 12/09/19   Drema Dallas, DO  SUMAtriptan 6 MG/0.5ML SOAJ Inject 6 mg into the skin as needed (Migraine).  07/19/18   [provider]  topiramate (TOPAMAX) 25 MG tablet Take 100 mg by mouth at bedtime. 05/11/22   [provider]  valACYclovir (VALTREX) 1000 MG tablet Take 1 tablet (1,000 mg total) by mouth 2 (two) times daily. 05/09/17   Cresenzo-Dishmon, Scarlette Calico, CNM  zolpidem (AMBIEN CR) 12.5 MG CR tablet Take 12.5 mg by mouth at bedtime. 04/14/21   [provider]      Allergies    Aimovig [erenumab], Other, Carbamazepine, Compazine [prochlorperazine], and Amoxicillin-pot clavulanate    Review of Systems   Review of Systems  All other systems reviewed and are negative.   Physical Exam Updated  Vital Signs BP 125/89 (BP Location: Right Arm)   Pulse 86   Temp 97.7 F (36.5 C) (Oral)   Resp 18   SpO2 97%  Physical Exam Vitals and nursing note reviewed.  Constitutional:      General: She is not in acute distress.    Appearance: She is well-developed.  HENT:     Head: Normocephalic and atraumatic.  Eyes:     Conjunctiva/sclera: Conjunctivae normal.  Cardiovascular:      Rate and Rhythm: Normal rate and regular rhythm.     Heart sounds: No murmur heard. Pulmonary:     Effort: Pulmonary effort is normal. No respiratory distress.     Breath sounds: Normal breath sounds. No wheezing, rhonchi or rales.  Abdominal:     Palpations: Abdomen is soft.     Tenderness: There is no abdominal tenderness.  Musculoskeletal:        General: No swelling.     Cervical back: Neck supple.  Skin:    General: Skin is warm and dry.     Capillary Refill: Capillary refill takes less than 2 seconds.  Neurological:     Mental Status: She is alert.     Comments: Alert and oriented to self, place, time and event.   Speech is fluent, clear without dysarthria or dysphasia.   Strength 5/5 in upper/lower extremities   Sensation intact in upper/lower extremities   Normal gait.  CN I not tested  CN II not tested CN III, IV, VI PERRLA and EOMs intact bilaterally  CN V Intact sensation to sharp and light touch to the face  CN VII facial movements symmetric  CN VIII not tested  CN IX, X no uvula deviation, symmetric rise of soft palate  CN XI 5/5 SCM and trapezius strength bilaterally  CN XII Midline tongue protrusion, symmetric L/R movements     Psychiatric:        Mood and Affect: Mood normal.     ED Results / Procedures / Treatments   Labs (all labs ordered are listed, but only abnormal results are displayed) Labs Reviewed - No data to display  EKG None  Radiology No results found.  Procedures Procedures    Medications Ordered in ED Medications  ketorolac (TORADOL) 15 MG/ML injection 15 mg (15 mg Intravenous Given 08/24/22 1159)  diphenhydrAMINE (BENADRYL) injection 25 mg (25 mg Intravenous Given 08/24/22 1157)  dexamethasone (DECADRON) injection 10 mg (10 mg Intravenous Given 08/24/22 1158)  metoCLOPramide (REGLAN) injection 10 mg (10 mg Intravenous Given 08/24/22 1159)  sodium chloride 0.9 % bolus 500 mL (0 mLs Intravenous Stopped 08/24/22 1328)    ED  Course/ Medical Decision Making/ A&P Clinical Course as of 08/24/22 1419  Thu Aug 24, 2022  1107 Hx of headaches. Headache x 1 week.  [CR]    Clinical Course User Index [CR] Peter Garter, PA                                 Medical Decision Making Risk Prescription drug management.   This patient presents to the ED for concern of headache, this involves an extensive number of treatment options, and is a complaint that carries with it a high risk of complications and morbidity.  The differential diagnosis includes migraine/tension/cluster headache, CVA, cerebral venous thrombosis, meningitis/encephalitis, carotid artery/vertebral artery dissection, etc.   Co morbidities that complicate the patient evaluation  See HPI   Additional history obtained:  Additional history obtained from EMR External records from outside source obtained and reviewed including hospital records   Lab Tests:  Patient declined  Imaging Studies ordered:  N/a   Cardiac Monitoring: / EKG:  The patient was maintained on a cardiac monitor.  I personally viewed and interpreted the cardiac monitored which showed an underlying rhythm of: Sinus rhythm   Consultations Obtained:  N/a   Problem List / ED Course / Critical interventions / Medication management  Headache I ordered medication including dexamethasone, Benadryl, Reglan, Toradol, 500 cc normal saline   Reevaluation of the patient after these medicines showed that the patient improved I have reviewed the patients home medicines and have made adjustments as needed   Social Determinants of Health:  Denies tobacco, licit drug use   Test / Admission - Considered:  Headache Vitals signs within normal range and stable throughout visit. 52 year old female presents emergency department with complaints of migraine type headache similar to headaches she has had in the past.  Reports gradual onset headache with worsening since onset.  Pain  seems to have been refractory to medications she has tried in the outpatient setting.  Patient without any current neurologic deficits with similar reporting headache from headaches in the past with imaging studies were deemed unnecessary.  Patient treated with migraine cocktail on the emergency department notes significant improvement of symptoms.  Suspect patient's symptoms are likely secondary to migraine.  Recommend follow-up with neurology in the outpatient setting for reassessment of symptoms.  Treatment plan discussed at length with patient and she knowledge understanding was agreeable to said plan.  Patient overall well-appearing, afebrile in no acute distress. Worrisome signs and symptoms were discussed with the patient, and the patient acknowledged understanding to return to the ED if noticed. Patient was stable upon discharge.          Final Clinical Impression(s) / ED Diagnoses Final diagnoses:  Acute nonintractable headache, unspecified headache type    Rx / DC Orders ED Discharge Orders     None         Peter Garter, [place] 08/24/22 1419    Pricilla Loveless, MD 08/25/22 [phone]

## 2022-08-24 NOTE — Discharge Instructions (Signed)
As discussed, visit to the emergency department today overall reassuring.  Recommend follow-up with neurology for reassessment of your headaches.  Please do not hesitate to return to emergency department if there are worrisome signs and symptoms we discussed to become apparent.

## 2022-08-24 NOTE — Telephone Encounter (Signed)
Therapist attempted to contact patient via phone regarding scheduled in office appointment and received voicemail message.  Therapist called patient, left message indicating attempt and requesting patient call office.

## 2022-08-24 NOTE — ED Triage Notes (Signed)
Pt arrives via POV with c/o worsening migraine x 1 week that has become severe over the last 2 days. Pt has a hx of migraines, states she takes Topamax and Zyprexa. Rates pain 9/10.

## 2022-08-31 ENCOUNTER — Telehealth: Payer: Self-pay

## 2022-08-31 NOTE — Telephone Encounter (Signed)
Transition Care Management Follow-up Telephone Call Date of discharge and from where: 08/24/2022 [facility] How have you been since you were released from the hospital? Patient stated she is feeling much better. Any questions or concerns? No  Items Reviewed: Did the pt receive and understand the discharge instructions provided? Yes  Medications obtained and verified?  No medication prescribed. Other? No  Any new allergies since your discharge? No  Dietary orders reviewed? Yes Do you have support at home? Yes   Follow up appointments reviewed:  PCP Hospital f/u appt confirmed? No  Scheduled to see  on  @ . Specialist Hospital f/u appt confirmed? Yes  Scheduled to see Dorian Pod, NP on 09/12/2022 @ [facility] Neurology. Are transportation arrangements needed? No  If their condition worsens, is the pt aware to call PCP or go to the Emergency Dept.? Yes Was the patient provided with contact information for the PCP's office or ED? Yes Was to pt encouraged to call back with questions or concerns? Yes  Darya Bigler [facility]  [facility] Population Health Community Resource Care Guide   ??[email or web address]  ?? [phone]   Website: [email or web address]  [email or web address]

## 2022-09-20 ENCOUNTER — Encounter (HOSPITAL_COMMUNITY): Payer: Self-pay | Admitting: Psychiatry

## 2022-09-21 ENCOUNTER — Telehealth (HOSPITAL_COMMUNITY): Payer: Medicare HMO | Admitting: Psychiatry

## 2022-09-22 ENCOUNTER — Telehealth (HOSPITAL_COMMUNITY): Payer: Medicare HMO | Admitting: Psychiatry

## 2022-09-27 ENCOUNTER — Ambulatory Visit: Payer: Medicare HMO | Admitting: Family Medicine

## 2022-09-27 ENCOUNTER — Ambulatory Visit (HOSPITAL_COMMUNITY): Payer: Medicare HMO | Admitting: Psychiatry

## 2022-10-10 ENCOUNTER — Ambulatory Visit: Payer: Medicare HMO | Admitting: Family Medicine

## 2022-10-10 ENCOUNTER — Encounter: Payer: Self-pay | Admitting: Family Medicine

## 2022-10-11 DIAGNOSIS — G43719 Chronic migraine without aura, intractable, without status migrainosus: Secondary | ICD-10-CM | POA: Diagnosis not present

## 2022-10-11 DIAGNOSIS — R202 Paresthesia of skin: Secondary | ICD-10-CM | POA: Diagnosis not present

## 2022-10-17 ENCOUNTER — Encounter: Payer: Self-pay | Admitting: Family Medicine

## 2022-10-17 ENCOUNTER — Ambulatory Visit: Payer: Medicare HMO | Admitting: Family Medicine

## 2022-10-21 ENCOUNTER — Encounter (HOSPITAL_COMMUNITY): Payer: Self-pay

## 2022-10-21 ENCOUNTER — Other Ambulatory Visit: Payer: Self-pay

## 2022-10-21 ENCOUNTER — Emergency Department (HOSPITAL_COMMUNITY)
Admission: EM | Admit: 2022-10-21 | Discharge: 2022-10-21 | Disposition: A | Discharge status: Home / Self Care | Payer: Medicare HMO | Attending: Emergency Medicine | Admitting: Emergency Medicine

## 2022-10-21 DIAGNOSIS — G43109 Migraine with aura, not intractable, without status migrainosus: Secondary | ICD-10-CM | POA: Insufficient documentation

## 2022-10-21 DIAGNOSIS — R519 Headache, unspecified: Secondary | ICD-10-CM | POA: Diagnosis present

## 2022-10-21 MED ORDER — KETOROLAC TROMETHAMINE 30 MG/ML IJ SOLN
15.0000 mg | Freq: Once | INTRAMUSCULAR | Status: AC
  Administered 2022-10-21: 15 mg via INTRAVENOUS
  Filled 2022-10-21: qty 1

## 2022-10-21 MED ORDER — DEXAMETHASONE SODIUM PHOSPHATE 10 MG/ML IJ SOLN
10.0000 mg | Freq: Once | INTRAMUSCULAR | Status: AC
  Administered 2022-10-21: 10 mg via INTRAVENOUS
  Filled 2022-10-21: qty 1

## 2022-10-21 MED ORDER — DIPHENHYDRAMINE HCL 50 MG/ML IJ SOLN
12.5000 mg | Freq: Once | INTRAMUSCULAR | Status: AC
  Administered 2022-10-21: 12.5 mg via INTRAVENOUS
  Filled 2022-10-21: qty 1

## 2022-10-21 MED ORDER — METOCLOPRAMIDE HCL 5 MG/ML IJ SOLN
10.0000 mg | Freq: Once | INTRAMUSCULAR | Status: AC
  Administered 2022-10-21: 10 mg via INTRAVENOUS
  Filled 2022-10-21: qty 2

## 2022-10-21 NOTE — ED Triage Notes (Signed)
Migraine started this morning Took 2 Imitrex injections without relief Migraines x30 years

## 2022-10-21 NOTE — Discharge Instructions (Signed)
Plan to follow up with this your neurologist as needed.

## 2022-10-21 NOTE — ED Provider Notes (Signed)
[place] EMERGENCY DEPARTMENT AT [facility] Provider Note   CSN: [id number] Arrival date & time: 10/21/22  1841     History  Chief Complaint  Patient presents with   Migraine    Martha Aguilar is a 52 y.o. female with a history of chronic migraine headaches under the care of neurology at [facility] who gets scheduled Botox injections and was recently on Topamax but switched to Zonegran presenting for a breakthrough migraine which has not responded to Imitrex injections from earlier today.  She endorses headache is similar to prior migraines, mostly right anterior in location along with photophobia.  Nausea without emesis, she denies focal weakness or numbness, no dizziness.  No fevers or chills.  The history is provided by the patient.       Home Medications Prior to Admission medications   Medication Sig Start Date End Date Taking? Authorizing Provider  amitriptyline (ELAVIL) 100 MG tablet Take 100 mg by mouth at bedtime. 04/25/21   [provider]  estradiol (ESTRACE) 1 MG tablet Take 1 tablet by mouth daily. 12/19/21   [provider]  gabapentin (NEURONTIN) 800 MG tablet Take 1 tablet (800 mg total) by mouth 3 (three) times daily. 06/15/20   Drema Dallas, DO  levothyroxine (SYNTHROID) 150 MCG tablet Take 150 mcg by mouth daily before breakfast. Only on Sat and Sunday    [provider]  levothyroxine (SYNTHROID) 175 MCG tablet Take 175 mcg by mouth every morning. Monday thru Friday 10/21/20   [provider]  meloxicam (MOBIC) 7.5 MG tablet Take 7.5 mg by mouth 2 (two) times daily. 06/26/22   [provider]  OnabotulinumtoxinA (BOTOX IJ) Inject into the skin. Migraine prn    [provider]  ondansetron (ZOFRAN-ODT) 4 MG disintegrating tablet Take 4 mg by mouth every 8 (eight) hours as needed. 10/30/20   [provider]  progesterone (PROMETRIUM) 100 MG capsule daily. 08/24/18   [provider]   SUMAtriptan (IMITREX) 100 MG tablet Take 1 tablet (100 mg total) by mouth every 2 (two) hours as needed for migraine. May repeat in 2 hours if headache persists or recurs. 06/13/21   Burgess Amor, PA-C  SUMAtriptan (TOSYMRA) 10 MG/ACT SOLN Place 10 mg into the nose every hour as needed (Maximum 3 sprays in 24 hours.). 12/09/19   Drema Dallas, DO  SUMAtriptan 6 MG/0.5ML SOAJ Inject 6 mg into the skin as needed (Migraine).  07/19/18   [provider]  topiramate (TOPAMAX) 25 MG tablet Take 100 mg by mouth at bedtime. 05/11/22   [provider]  valACYclovir (VALTREX) 1000 MG tablet Take 1 tablet (1,000 mg total) by mouth 2 (two) times daily. 05/09/17   Cresenzo-Dishmon, Scarlette Calico, CNM  zolpidem (AMBIEN CR) 12.5 MG CR tablet Take 12.5 mg by mouth at bedtime. 04/14/21   [provider]      Allergies    Aimovig [erenumab], Other, Carbamazepine, Compazine [prochlorperazine], and Amoxicillin-pot clavulanate    Review of Systems   Review of Systems  Constitutional:  Negative for fever.  HENT:  Negative for congestion and sore throat.   Eyes:  Positive for photophobia. Negative for visual disturbance.  Respiratory:  Negative for chest tightness and shortness of breath.   Cardiovascular:  Negative for chest pain.  Gastrointestinal:  Negative for abdominal pain and nausea.  Genitourinary: Negative.   Musculoskeletal:  Negative for arthralgias, joint swelling and neck pain.  Skin: Negative.  Negative for rash and wound.  Neurological:  Positive for headaches. Negative for dizziness, weakness, light-headedness and numbness.  Psychiatric/Behavioral: Negative.      Physical Exam Updated Vital Signs BP 134/89   Pulse 78   Temp 98 F (36.7 C)   Resp 18   Ht 5\' 6"  (1.676 m)   Wt 64.9 kg   SpO2 99%   BMI 23.08 kg/m  Physical Exam Vitals and nursing note reviewed.  Constitutional:      Appearance: She is well-developed.     Comments: Uncomfortable appearing  HENT:     Head:  Normocephalic and atraumatic.     Right Ear: Tympanic membrane normal.     Left Ear: Tympanic membrane normal.  Eyes:     Extraocular Movements: Extraocular movements intact.     Pupils: Pupils are equal, round, and reactive to light.  Cardiovascular:     Rate and Rhythm: Normal rate.     Heart sounds: Normal heart sounds.  Pulmonary:     Effort: Pulmonary effort is normal.  Abdominal:     Palpations: Abdomen is soft.     Tenderness: There is no abdominal tenderness.  Musculoskeletal:        General: Normal range of motion.     Cervical back: Normal range of motion and neck supple.  Lymphadenopathy:     Cervical: No cervical adenopathy.  Skin:    General: Skin is warm and dry.     Findings: No rash.  Neurological:     General: No focal deficit present.     Mental Status: She is alert and oriented to person, place, and time.     GCS: GCS eye subscore is 4. GCS verbal subscore is 5. GCS motor subscore is 6.     Cranial Nerves: No cranial nerve deficit.     Sensory: No sensory deficit.     Coordination: Coordination normal.     Gait: Gait normal.     Deep Tendon Reflexes: Reflexes normal.     Comments: Normal heel-shin, normal rapid alternating movements. Cranial nerves III-XII intact.  No pronator drift.  Psychiatric:        Speech: Speech normal.        Behavior: Behavior normal.        Thought Content: Thought content normal.     ED Results / Procedures / Treatments   Labs (all labs ordered are listed, but only abnormal results are displayed) Labs Reviewed - No data to display  EKG None  Radiology No results found.  Procedures Procedures    Medications Ordered in ED Medications  dexamethasone (DECADRON) injection 10 mg (10 mg Intravenous Given 10/21/22 2025)  diphenhydrAMINE (BENADRYL) injection 12.5 mg (12.5 mg Intravenous Given 10/21/22 2025)  metoCLOPramide (REGLAN) injection 10 mg (10 mg Intravenous Given 10/21/22 2025)  ketorolac (TORADOL) 30 MG/ML  injection 15 mg (15 mg Intravenous Given 10/21/22 2025)    ED Course/ Medical Decision Making/ A&P                                 Medical Decision Making Patient presenting with her classic chronic migraine headache, no neuro deficits, her headache is consistent with prior migraines.  Exam is reassuring.  She was given a migraine cocktail per IV and had complete resolution of her headache symptoms.  Risk Prescription drug management.           Final Clinical Impression(s) / ED Diagnoses Final diagnoses:  Migraine with aura and without  status migrainosus, not intractable    Rx / DC Orders ED Discharge Orders     None         Victoriano Lain 10/21/22 2133    Rondel Baton, MD 10/23/22 [phone]

## 2022-10-24 ENCOUNTER — Encounter: Payer: Self-pay | Admitting: Family Medicine

## 2022-10-31 DIAGNOSIS — G43719 Chronic migraine without aura, intractable, without status migrainosus: Secondary | ICD-10-CM | POA: Diagnosis not present

## 2022-11-23 NOTE — Progress Notes (Signed)
Established Patient Office Visit   Subjective  Patient ID: Martha Aguilar, female    DOB: 03-18-1970  Age: 52 y.o. MRN: [id number]  Chief Complaint  Patient presents with   Follow-up    Anxiety Depression- family issues w/ child causing stress.  IBS-C    She  has a past medical history of Constipation (09/05/2018), Dehydration (10/19/2011), Fibromuscular dysplasia (HCC) (08/25/2020), Headache(784.0), Hypothyroidism, IBS (irritable bowel syndrome) (11/16/2020), Interstitial cystitis (09/14/2020), Migraines, Occipital neuralgia, Raynaud disease (02/16/2021), and Venous stenosis of upper extremity.  Anxiety  The patient presents for an initial visit due to worsening symptoms, including excessive worry, irritability, nervousness, restlessness, and significant distress. The symptoms have been present constantly and have gradually worsened over time, significantly impacting daily functioning. The patient reports that their daughter's comments about suicide have been particularly distressing, contributing to frequent migraines and excessive sleeping, as a way to cope with the emotional burden. The symptoms are aggravated by ongoing family issues, and the patient has a history of prior traumatic experiences, which may be contributing risk factors. Despite good compliance with previous treatments with trazodone medication,  these have not provided relief.   Chronic constipation   The patient also reports chronic constipation that has progressively worsened over time. She has bowel movements approximately once per week, with stools described as firm and formed. Contributing factors include a low-fiber diet and inadequate water intake, although she exercises regularly. Associated symptoms include abdominal pain, bloating, hemorrhoids, and rectal discomfort. She notes that stress is a significant risk factor. Previous treatment with intake of daily Miralax provided only mild relief. Her medical history  is notable for irritable bowel syndrome, which may also contribute to her current symptoms.    Review of Systems  Constitutional:  Negative for chills and fever.  Respiratory:  Negative for shortness of breath.   Cardiovascular:  Negative for chest pain.  Gastrointestinal:  Positive for abdominal pain and constipation.  Neurological:  Positive for headaches.  Psychiatric/Behavioral:  The patient is nervous/anxious.       Objective:     BP 107/75   Pulse 75   Ht 5\' 6"  (1.676 m)   Wt 150 lb (68 kg)   SpO2 96%   BMI 24.21 kg/m  BP Readings from Last 3 Encounters:  11/24/22 107/75  10/21/22 134/83  08/24/22 125/89      Physical Exam Vitals reviewed.  Constitutional:      General: She is not in acute distress.    Appearance: Normal appearance. She is not ill-appearing, toxic-appearing or diaphoretic.  HENT:     Head: Normocephalic.  Eyes:     General:        Right eye: No discharge.        Left eye: No discharge.     Conjunctiva/sclera: Conjunctivae normal.  Cardiovascular:     Rate and Rhythm: Normal rate.     Pulses: Normal pulses.     Heart sounds: Normal heart sounds.  Pulmonary:     Effort: Pulmonary effort is normal. No respiratory distress.     Breath sounds: Normal breath sounds.  Abdominal:     General: Bowel sounds are normal.     Palpations: Abdomen is soft.     Tenderness: There is no abdominal tenderness. There is no right CVA tenderness, left CVA tenderness or guarding.  Skin:    General: Skin is warm and dry.     Capillary Refill: Capillary refill takes less than 2 seconds.  Neurological:  Mental Status: She is alert.  Psychiatric:        Mood and Affect: Mood normal.        Behavior: Behavior normal.      No results found for any visits on 11/24/22.  The 10-year ASCVD risk score (Arnett DK, et al., 2019) is: 1%    Assessment & Plan:  Chronic constipation Assessment & Plan: Advise drink 8-10 glasses of water daily to keep stools  soft and promote regularity. Increase dietary fiber to 25-30 grams per day through foods like fruits, vegetables, and whole grains, and consider adding Benefiber as a fiber supplement. Engage in regular physical activity, such as walking or yoga, for at least 30 minutes most days to stimulate bowel function. Establish a consistent bathroom routine, ideally after meals, and avoid delaying the urge to go. Continue using Miralax (1 cap daily) as recommended, and consider a daily probiotic to support gut health  Orders: -     Ambulatory referral to Gastroenterology  Anxiety Assessment & Plan:    11/24/2022    1:09 PM 03/28/2022    2:18 PM 05/16/2021   11:36 AM  GAD 7 : Generalized Anxiety Score  Nervous, Anxious, on Edge 2    Control/stop worrying 3    Worry too much - different things 3    Trouble relaxing 3    Restless 2    Easily annoyed or irritable 2    Afraid - awful might happen 3    Total GAD 7 Score 18    Anxiety Difficulty Not difficult at all       Information is confidential and restricted. Go to Review Flowsheets to unlock data.   The patient is currently taking amitriptyline for migraines, and trazodone for anxiety and ambien at bedtime. A referral has been placed for psychiatric evaluation to assist with medication management and to initiate counseling for comprehensive care  We discussed several non-pharmacological approaches to managing anxiety, including:  Establishing a consistent daily routine: This helps create structure and stability. Practicing mindfulness and relaxation techniques: Incorporating meditation, deep breathing exercises, or yoga to manage stress and improve emotional well-being. Engaging in regular physical activity: Aim for at least 30 minutes of exercise most days to boost mood and energy levels. Spending time outdoors: Exposure to natural light and fresh air can improve mental health. Building a support network: Encouraging social connections with  friends, family, or support groups to reduce feelings of isolation. Prioritizing a balanced diet: Eating nutrient-rich foods while avoiding excessive amounts of processed foods, sugar, and unhealthy fats. Follow-up is recommended in 4-8 weeks to assess progress, with a referral to behavioral health for further support if needed.      Orders: -     Ambulatory referral to Psychiatry    Return if symptoms worsen or fail to improve.   Cruzita Lederer Newman Nip, FNP

## 2022-11-23 NOTE — Patient Instructions (Addendum)
        Great to see you today.  I have refilled the medication(s) we provide.    - Please take medications as prescribed. - Follow up with your primary health provider if any health concerns arises. - If symptoms worsen please contact your primary care provider and/or visit the emergency department.  

## 2022-11-24 ENCOUNTER — Encounter: Payer: Self-pay | Admitting: Family Medicine

## 2022-11-24 ENCOUNTER — Ambulatory Visit: Payer: Medicare HMO | Admitting: Family Medicine

## 2022-11-24 VITALS — BP 107/75 | HR 75 | Ht 66.0 in | Wt 150.0 lb

## 2022-11-24 DIAGNOSIS — K5909 Other constipation: Secondary | ICD-10-CM | POA: Diagnosis not present

## 2022-11-24 DIAGNOSIS — F419 Anxiety disorder, unspecified: Secondary | ICD-10-CM

## 2022-11-24 NOTE — Assessment & Plan Note (Signed)
Advise drink 8-10 glasses of water daily to keep stools soft and promote regularity. Increase dietary fiber to 25-30 grams per day through foods like fruits, vegetables, and whole grains, and consider adding Benefiber as a fiber supplement. Engage in regular physical activity, such as walking or yoga, for at least 30 minutes most days to stimulate bowel function. Establish a consistent bathroom routine, ideally after meals, and avoid delaying the urge to go. Continue using Miralax (1 cap daily) as recommended, and consider a daily probiotic to support gut health

## 2022-11-24 NOTE — Assessment & Plan Note (Addendum)
    11/24/2022    1:09 PM 03/28/2022    2:18 PM 05/16/2021   11:36 AM  GAD 7 : Generalized Anxiety Score  Nervous, Anxious, on Edge 2    Control/stop worrying 3    Worry too much - different things 3    Trouble relaxing 3    Restless 2    Easily annoyed or irritable 2    Afraid - awful might happen 3    Total GAD 7 Score 18    Anxiety Difficulty Not difficult at all       Information is confidential and restricted. Go to Review Flowsheets to unlock data.   The patient is currently taking amitriptyline for migraines, and trazodone for anxiety and ambien at bedtime. A referral has been placed for psychiatric evaluation to assist with medication management and to initiate counseling for comprehensive care  We discussed several non-pharmacological approaches to managing anxiety, including:  Establishing a consistent daily routine: This helps create structure and stability. Practicing mindfulness and relaxation techniques: Incorporating meditation, deep breathing exercises, or yoga to manage stress and improve emotional well-being. Engaging in regular physical activity: Aim for at least 30 minutes of exercise most days to boost mood and energy levels. Spending time outdoors: Exposure to natural light and fresh air can improve mental health. Building a support network: Encouraging social connections with friends, family, or support groups to reduce feelings of isolation. Prioritizing a balanced diet: Eating nutrient-rich foods while avoiding excessive amounts of processed foods, sugar, and unhealthy fats. Follow-up is recommended in 4-8 weeks to assess progress, with a referral to behavioral health for further support if needed.

## 2022-11-27 ENCOUNTER — Encounter (INDEPENDENT_AMBULATORY_CARE_PROVIDER_SITE_OTHER): Payer: Self-pay | Admitting: *Deleted

## 2022-11-27 ENCOUNTER — Ambulatory Visit: Payer: Medicare HMO | Admitting: Family Medicine

## 2022-11-27 DIAGNOSIS — G43719 Chronic migraine without aura, intractable, without status migrainosus: Secondary | ICD-10-CM | POA: Diagnosis not present

## 2022-12-14 ENCOUNTER — Encounter (INDEPENDENT_AMBULATORY_CARE_PROVIDER_SITE_OTHER): Payer: Self-pay | Admitting: Gastroenterology

## 2022-12-14 ENCOUNTER — Ambulatory Visit (INDEPENDENT_AMBULATORY_CARE_PROVIDER_SITE_OTHER): Payer: Medicare HMO | Admitting: Gastroenterology

## 2022-12-14 VITALS — BP 100/67 | HR 87 | Temp 97.8°F | Ht 66.5 in | Wt 156.4 lb

## 2022-12-14 DIAGNOSIS — Z860100 Personal history of colon polyps, unspecified: Secondary | ICD-10-CM

## 2022-12-14 DIAGNOSIS — K581 Irritable bowel syndrome with constipation: Secondary | ICD-10-CM | POA: Diagnosis not present

## 2022-12-14 MED ORDER — LINACLOTIDE 145 MCG PO CAPS: 145.0000 ug | ORAL_CAPSULE | Freq: Every day | ORAL | 3 refills | Status: AC

## 2022-12-14 MED ORDER — PEG 3350-KCL-NA BICARB-NACL 420 G PO SOLR: 4000.0000 mL | Freq: Once | ORAL | 0 refills | Status: AC

## 2022-12-14 NOTE — Patient Instructions (Addendum)
Start bowel prep After he finished bowel prep, start Linzess 145 mcg every day Stop using MiraLAX, magnesium citrate and Dulcolax for now Repeat colonoscopy in October 2025  ----   Linzess works best when taken once a day every day, on an empty stomach, at least 30 minutes before your first meal of the day.  When Linzess is taken daily as directed:  *Constipation relief is typically felt in about a week *IBS-C patients may begin to experience relief from belly pain and overall abdominal symptoms (pain, discomfort, and bloating) in about 1 week,   with symptoms typically improving over 12 weeks.  Diarrhea, nausea or abdominal cramping may occur in the first 2 weeks -keep taking it.  These symptoms should eventually resolve. Please notify us if having more than 4 watery bowel movements per day or fecal soiling accidents.

## 2022-12-14 NOTE — Progress Notes (Signed)
Katrinka Blazing, M.D. Gastroenterology & Hepatology [facility] [address] [place], [place] [address] Primary Care Physician: Gilmore Laroche, FNP [address] [place] [address]  Referring MD: PCP  Chief Complaint: Constipation and bloating  History of Present Illness: Martha Aguilar is a 52 y.o. female with past medical history of migraines, IBS, hypothyroidism, Raynaud's disease, occipital neuralgia, interstitial cystitis, who presents for evaluation of constipation and bloating.  Patient reports that since her late 30s she has had issues with constipation. States that most of her life she has been managing constipation with Miralax. States that when she cannot move her bowels,s he will get very distented and will have abdominal discomfort. States that with Miralax she was moving her bowels possibly once a week.  States since November 15/2024 she has not been able to have a bowel movement on her own. Since that day, she has taken Miralax 2-3 capful per day (sometimes twice a day), 1 bottle of magnesium citrate and fleet enemas x2. Has had some bowel movements with small amount of stool coming. Reports she is able to pass some gas. Also has some nausea, no vomiting. Has presented significant abdominal distention with some abdominal pain in the upper abdominal. She reports feeling relief of her symptoms after she moves her bowels.  Patient reports she has previously tried Dulcolax suppositories and stool softeners but these medications do not seem to help.  The patient denies having any fever, chills, hematochezia, melena, hematemesis, diarrhea, jaundice, pruritus . Has gained 10 lb since June 2024.  Most recent labs from 08/21/2022 showed CMP with calcium 9.4, potassium 3.9, normal renal function and liver enzymes.  CBC was within normal limits.  TSH was 3.65.  She is currently on Elavil.  Last Colonoscopy:10/23/2018 - Melanosis in  the colon. - One 5 to 8 mm polyp in the cecum, removed with a cold snare. Resected and retrieved. Clips ( MR conditional) were placed. - Two small polyps in the cecum. Biopsied. - One 5 to 8 mm polyp in the cecum, removed with a cold snare. Resected and retrieved. - Diverticulosis in the sigmoid colon. - Small rectal ulcer. Biopsied. - External hemorrhoids. - Anal papilla( e) were hypertrophied.  A. COLON, CECAL, POLYPECTOMY:  -  Tubular adenoma (5 of 9 fragments)  -  Benign colonic mucosa (5 of 9 fragments)  -  No high grade dysplasia or malignancy identified   B. COLON, CECAL, POLYPECTOMY:  -  Tubular adenoma (1 of 1 fragments)  -  No high grade dysplasia or malignancy identified   C. RECTUM, BIOPSY:  -  Benign colonic mucosa  -  No active inflammation or evidence of microscopic colitis  -  No high grade dysplasia or malignancy identified   Recommended repeat colonoscopy in 5 years  Past Medical History: Past Medical History:  Diagnosis Date   Constipation 09/05/2018   Dehydration 10/19/2011   Fibromuscular dysplasia (HCC) 08/25/2020   Headache(784.0)    migraines   Hypothyroidism    IBS (irritable bowel syndrome) 11/16/2020   Interstitial cystitis 09/14/2020   Migraines    Occipital neuralgia    Raynaud disease 02/16/2021   Venous stenosis of upper extremity     Past Surgical History: Past Surgical History:  Procedure Laterality Date   BIOPSY  10/23/2018   Procedure: BIOPSY;  Surgeon: Malissa Hippo, MD;  [facility];  Service: Endoscopy;;   COLONOSCOPY N/A 10/23/2018   Procedure: COLONOSCOPY;  Surgeon: Karilyn Cota,  Joline Maxcy, MD;  Location: [facility];  Service: Endoscopy;  Laterality: N/A;  1:00   Decompression Migraine surgery  10/16/2017   ESOPHAGOGASTRODUODENOSCOPY (EGD) WITH PROPOFOL N/A 06/13/2018   Procedure: ESOPHAGOGASTRODUODENOSCOPY (EGD) WITH PROPOFOL;  Surgeon: Malissa Hippo, MD;  Location: [facility];  Service: Endoscopy;  Laterality: N/A;    INSERTION / PLACEMENT / REVISION NEUROSTIMULATOR  02/18/2019   POLYPECTOMY  10/23/2018   Procedure: POLYPECTOMY;  Surgeon: Malissa Hippo, MD;  Location: [facility];  Service: Endoscopy;;   TUBAL LIGATION     uterine ablation     uterine polyps      Family History: Family History  Problem Relation Age of Onset   Migraines Mother        benign small bowel tumor   Prostate cancer Father    Depression Brother    Seizures Brother        MVA   Depression Cousin    Migraines Other     Social History: Social History   Tobacco Use  Smoking Status Never  Smokeless Tobacco Never   Social History   Substance and Sexual Activity  Alcohol Use Not Currently   Comment: social   Social History   Substance and Sexual Activity  Drug Use No    Allergies: Allergies  Allergen Reactions   Aimovig [Erenumab] Other (See Comments)    Burning in the back of the head, severe migraine   Other Rash    Oxtellar  Oxcarbazepine    Carbamazepine    Compazine [Prochlorperazine] Other (See Comments)    IV form, can take PO   Amoxicillin-Pot Clavulanate Rash and Swelling    Swelling of tongue and face    Medications: Current Outpatient Medications  Medication Sig Dispense Refill   amitriptyline (ELAVIL) 100 MG tablet Take 100 mg by mouth at bedtime.     Eptinezumab-jjmr (VYEPTI) 100 MG/ML injection Inject into the vein. Every three months.     estradiol (ESTRACE) 1 MG tablet Take 1 tablet by mouth daily.     gabapentin (NEURONTIN) 800 MG tablet Take 1 tablet (800 mg total) by mouth 3 (three) times daily. 90 tablet 0   levothyroxine (SYNTHROID) 175 MCG tablet Take 175 mcg by mouth every morning. Except does not take on Sunday.     OnabotulinumtoxinA (BOTOX IJ) Inject into the skin. Migraine prn     ondansetron (ZOFRAN-ODT) 4 MG disintegrating tablet Take 4 mg by mouth every 8 (eight) hours as needed.     progesterone (PROMETRIUM) 100 MG capsule daily.     SUMAtriptan (IMITREX)  100 MG tablet Take 1 tablet (100 mg total) by mouth every 2 (two) hours as needed for migraine. May repeat in 2 hours if headache persists or recurs. 10 tablet 0   SUMAtriptan 6 MG/0.5ML SOAJ Inject 6 mg into the skin as needed (Migraine).      topiramate (TOPAMAX) 25 MG tablet Take 100 mg by mouth at bedtime.     traZODone (DESYREL) 100 MG tablet Take 200 mg by mouth at bedtime as needed.     valACYclovir (VALTREX) 1000 MG tablet Take 1 tablet (1,000 mg total) by mouth 2 (two) times daily. 20 tablet 0   zolpidem (AMBIEN CR) 12.5 MG CR tablet Take 10 mg by mouth at bedtime.     No current facility-administered medications for this visit.    Review of Systems: GENERAL: negative for malaise, night sweats HEENT: No changes in hearing or vision, no nose bleeds or  other nasal problems. NECK: Negative for lumps, goiter, pain and significant neck swelling RESPIRATORY: Negative for cough, wheezing CARDIOVASCULAR: Negative for chest pain, leg swelling, palpitations, orthopnea GI: SEE HPI MUSCULOSKELETAL: Negative for joint pain or swelling, back pain, and muscle pain. SKIN: Negative for lesions, rash PSYCH: Negative for sleep disturbance, mood disorder and recent psychosocial stressors. HEMATOLOGY Negative for prolonged bleeding, bruising easily, and swollen nodes. ENDOCRINE: Negative for cold or heat intolerance, polyuria, polydipsia and goiter. NEURO: negative for tremor, gait imbalance, syncope and seizures. The remainder of the review of systems is noncontributory.   Physical Exam: BP 100/67 (BP Location: Left Arm, Patient Position: Sitting, Cuff Size: Normal)   Pulse 87   Temp 97.8 F (36.6 C) (Temporal)   Ht 5' 6.5" (1.689 m)   Wt 156 lb 6.4 oz (70.9 kg)   BMI 24.87 kg/m  GENERAL: The patient is AO x3, in no acute distress. HEENT: Head is normocephalic and atraumatic. EOMI are intact. Mouth is well hydrated and without lesions. NECK: Supple. No masses LUNGS: Clear to auscultation.  No presence of rhonchi/wheezing/rales. Adequate chest expansion HEART: RRR, normal s1 and s2. ABDOMEN: Soft, nontender, no guarding, no peritoneal signs, and mildly distended. BS +. No masses. EXTREMITIES: Without any cyanosis, clubbing, rash, lesions or edema. NEUROLOGIC: AOx3, no focal motor deficit. SKIN: no jaundice, no rashes   Imaging/Labs: as above  I personally reviewed and interpreted the available labs, imaging and endoscopic files.  Impression and Plan: Martha Aguilar is a 52 y.o. female with past medical history of migraines, IBS, hypothyroidism, Raynaud's disease, occipital neuralgia, interstitial cystitis, who presents for evaluation of constipation and bloating.  Patient is presenting classical symptoms of IBS-C, which have failed management with over-the-counter laxatives.  She actually is presenting some distention that is noticeable in the physical exam.  No presence of metabolic abnormalities that would explain her constipation.  At this point, we discussed the possibility of giving her a bowel prep followed by starting Linzess 145 mcg every day.  She is open to proceed with this.  Patient will be due for repeat colonoscopy in October 2025 given history of colon polyps.  -Start bowel prep After he finished bowel prep, start Linzess 145 mcg every day Stop using MiraLAX, magnesium citrate and Dulcolax for now Repeat colonoscopy in October 2025  All questions were answered.      Katrinka Blazing, MD Gastroenterology and Hepatology [facility] Gastroenterology

## 2022-12-27 ENCOUNTER — Telehealth: Payer: Self-pay | Admitting: Family Medicine

## 2022-12-27 NOTE — Telephone Encounter (Signed)
Called left voicemail, mail reminder for appointment

## 2022-12-27 NOTE — Telephone Encounter (Signed)
Needs appt scheduled to review her sleep issues before [facility] will accept her referral. Pls sch with Malachi Bonds

## 2023-01-09 DIAGNOSIS — F32A Depression, unspecified: Secondary | ICD-10-CM | POA: Diagnosis not present

## 2023-01-09 DIAGNOSIS — Z79899 Other long term (current) drug therapy: Secondary | ICD-10-CM | POA: Diagnosis not present

## 2023-01-09 DIAGNOSIS — F411 Generalized anxiety disorder: Secondary | ICD-10-CM | POA: Diagnosis not present

## 2023-01-09 DIAGNOSIS — Z5181 Encounter for therapeutic drug level monitoring: Secondary | ICD-10-CM | POA: Diagnosis not present

## 2023-01-09 DIAGNOSIS — F1320 Sedative, hypnotic or anxiolytic dependence, uncomplicated: Secondary | ICD-10-CM | POA: Diagnosis not present

## 2023-01-15 ENCOUNTER — Ambulatory Visit: Payer: Medicare HMO | Admitting: Family Medicine

## 2023-01-18 DIAGNOSIS — G43719 Chronic migraine without aura, intractable, without status migrainosus: Secondary | ICD-10-CM | POA: Diagnosis not present

## 2023-01-26 ENCOUNTER — Encounter: Payer: Self-pay | Admitting: Family Medicine

## 2023-02-06 DIAGNOSIS — F411 Generalized anxiety disorder: Secondary | ICD-10-CM | POA: Diagnosis not present

## 2023-02-06 DIAGNOSIS — F32A Depression, unspecified: Secondary | ICD-10-CM | POA: Diagnosis not present

## 2023-02-21 ENCOUNTER — Emergency Department (HOSPITAL_COMMUNITY)
Admission: EM | Admit: 2023-02-21 | Discharge: 2023-02-21 | Disposition: A | Discharge status: Home / Self Care | Payer: Medicare HMO | Attending: Emergency Medicine | Admitting: Emergency Medicine

## 2023-02-21 ENCOUNTER — Encounter (HOSPITAL_COMMUNITY): Payer: Self-pay

## 2023-02-21 ENCOUNTER — Other Ambulatory Visit: Payer: Self-pay

## 2023-02-21 DIAGNOSIS — R519 Headache, unspecified: Secondary | ICD-10-CM | POA: Insufficient documentation

## 2023-02-21 DIAGNOSIS — R110 Nausea: Secondary | ICD-10-CM | POA: Diagnosis not present

## 2023-02-21 MED ORDER — DIPHENHYDRAMINE HCL 50 MG/ML IJ SOLN
12.5000 mg | Freq: Once | INTRAMUSCULAR | Status: AC
Start: 2023-02-21 — End: 2023-02-21
  Administered 2023-02-21: 12.5 mg via INTRAVENOUS
  Filled 2023-02-21: qty 1

## 2023-02-21 MED ORDER — LACTATED RINGERS IV BOLUS
1000.0000 mL | Freq: Once | INTRAVENOUS | Status: AC
Start: 2023-02-21 — End: 2023-02-21
  Administered 2023-02-21: 1000 mL via INTRAVENOUS

## 2023-02-21 MED ORDER — METOCLOPRAMIDE HCL 5 MG/ML IJ SOLN
10.0000 mg | Freq: Once | INTRAMUSCULAR | Status: AC
Start: 2023-02-21 — End: 2023-02-21
  Administered 2023-02-21: 10 mg via INTRAVENOUS
  Filled 2023-02-21: qty 2

## 2023-02-21 MED ORDER — DEXAMETHASONE SODIUM PHOSPHATE 10 MG/ML IJ SOLN
10.0000 mg | Freq: Once | INTRAMUSCULAR | Status: AC
  Administered 2023-02-21: 10 mg via INTRAVENOUS
  Filled 2023-02-21: qty 1

## 2023-02-21 MED ORDER — KETOROLAC TROMETHAMINE 15 MG/ML IJ SOLN
15.0000 mg | Freq: Once | INTRAMUSCULAR | Status: AC
  Administered 2023-02-21: 15 mg via INTRAVENOUS
  Filled 2023-02-21: qty 1

## 2023-02-21 NOTE — ED Notes (Signed)
Pt requesting a migraine cocktail. Pt seen here previously for same.

## 2023-02-21 NOTE — ED Triage Notes (Signed)
Pt arrived via POV from home c/o recurring migraine. Pt reports she was unable to make I tto her appoint recently for her routine infusion/injection for her migraines.

## 2023-02-21 NOTE — ED Provider Notes (Signed)
[place] EMERGENCY DEPARTMENT AT [facility] Provider Note   CSN: [id number] Arrival date & time: 02/21/23  1326     History  Chief Complaint  Patient presents with   Migraine    Martha Aguilar is a 53 y.o. female.   Migraine Associated symptoms include headaches.  Patient presents for headache.  Medical history includes chronic migraines, anxiety, depression, IBS.  She was previously on Topamax for prophylaxis.  She is no longer on that but thinks her neurologist may restart her.  She takes Imitrex and Neurontin as needed for migraines.  Current headache has been ongoing for the past 4 days.  It is frontal, primarily on the left.  Symptoms are consistent with prior migraine headaches.  She has had photosensitivity and nausea.  She has not vomited.     Home Medications Prior to Admission medications   Medication Sig Start Date End Date Taking? Authorizing Provider  amitriptyline (ELAVIL) 100 MG tablet Take 100 mg by mouth at bedtime. 04/25/21   [provider]  Eptinezumab-jjmr (VYEPTI) 100 MG/ML injection Inject into the vein. Every three months.    [provider]  estradiol (ESTRACE) 1 MG tablet Take 1 tablet by mouth daily. 12/19/21   [provider]  gabapentin (NEURONTIN) 800 MG tablet Take 1 tablet (800 mg total) by mouth 3 (three) times daily. 06/15/20   Drema Dallas, DO  levothyroxine (SYNTHROID) 175 MCG tablet Take 175 mcg by mouth every morning. Except does not take on Sunday. 10/21/20   [provider]  linaclotide Karlene Einstein) 145 MCG CAPS capsule Take 1 capsule (145 mcg total) by mouth daily before breakfast. 12/14/22   Marguerita Merles, Reuel Boom, MD  OnabotulinumtoxinA (BOTOX IJ) Inject into the skin. Migraine prn    [provider]  ondansetron (ZOFRAN-ODT) 4 MG disintegrating tablet Take 4 mg by mouth every 8 (eight) hours as needed. 10/30/20   [provider]  progesterone (PROMETRIUM) 100 MG capsule  daily. 08/24/18   [provider]  SUMAtriptan (IMITREX) 100 MG tablet Take 1 tablet (100 mg total) by mouth every 2 (two) hours as needed for migraine. May repeat in 2 hours if headache persists or recurs. 06/13/21   Burgess Amor, PA-C  SUMAtriptan 6 MG/0.5ML SOAJ Inject 6 mg into the skin as needed (Migraine).  07/19/18   [provider]  topiramate (TOPAMAX) 25 MG tablet Take 100 mg by mouth at bedtime. 05/11/22   [provider]  traZODone (DESYREL) 100 MG tablet Take 200 mg by mouth at bedtime as needed. 09/08/22   [provider]  valACYclovir (VALTREX) 1000 MG tablet Take 1 tablet (1,000 mg total) by mouth 2 (two) times daily. 05/09/17   Cresenzo-Dishmon, Scarlette Calico, CNM  zolpidem (AMBIEN CR) 12.5 MG CR tablet Take 10 mg by mouth at bedtime. 04/14/21   [provider]      Allergies    Aimovig [erenumab], Other, Carbamazepine, Compazine [prochlorperazine], and Amoxicillin-pot clavulanate    Review of Systems   Review of Systems  Eyes:  Positive for photophobia.  Gastrointestinal:  Positive for nausea.  Neurological:  Positive for headaches.  All other systems reviewed and are negative.   Physical Exam Updated Vital Signs BP (!) 141/89   Pulse 90   Temp (!) 97.5 F (36.4 C) (Oral)   Resp 14   Ht 5' 6.5" (1.689 m)   Wt 71 kg   SpO2 98%   BMI 24.89 kg/m  Physical Exam Vitals and nursing note reviewed.  Constitutional:      General: She is not in acute distress.    Appearance: Normal appearance. She is well-developed. She is not ill-appearing, toxic-appearing or diaphoretic.  HENT:     Head: Normocephalic and atraumatic.     Right Ear: External ear normal.     Left Ear: External ear normal.     Nose: Nose normal.     Mouth/Throat:     Mouth: Mucous membranes are moist.  Eyes:     Extraocular Movements: Extraocular movements intact.     Conjunctiva/sclera: Conjunctivae normal.  Cardiovascular:     Rate and Rhythm: Normal rate and regular  rhythm.  Pulmonary:     Effort: Pulmonary effort is normal. No respiratory distress.  Abdominal:     General: There is no distension.     Palpations: Abdomen is soft.  Musculoskeletal:        General: No swelling. Normal range of motion.     Cervical back: Normal range of motion and neck supple.  Skin:    General: Skin is warm and dry.     Coloration: Skin is not jaundiced or pale.  Neurological:     General: No focal deficit present.     Mental Status: She is alert and oriented to person, place, and time.     Cranial Nerves: No cranial nerve deficit.     Sensory: No sensory deficit.     Motor: No weakness.     Coordination: Coordination normal.  Psychiatric:        Mood and Affect: Mood normal.        Behavior: Behavior normal.     ED Results / Procedures / Treatments   Labs (all labs ordered are listed, but only abnormal results are displayed) Labs Reviewed - No data to display  EKG None  Radiology No results found.  Procedures Procedures    Medications Ordered in ED Medications  ketorolac (TORADOL) 15 MG/ML injection 15 mg (15 mg Intravenous Given 02/21/23 1702)  metoCLOPramide (REGLAN) injection 10 mg (10 mg Intravenous Given 02/21/23 1702)  diphenhydrAMINE (BENADRYL) injection 12.5 mg (12.5 mg Intravenous Given 02/21/23 1702)  lactated ringers bolus 1,000 mL (1,000 mLs Intravenous New Bag/Given 02/21/23 1701)  dexamethasone (DECADRON) injection 10 mg (10 mg Intravenous Given 02/21/23 1702)    ED Course/ Medical Decision Making/ A&P                                 Medical Decision Making Risk Prescription drug management.   Patient presenting for headache for the past 4 days, consistent with prior migraines.  Vital signs on arrival are normal.  Patient is well-appearing on exam.  She has no focal neurologic deficits.  Headache cocktail was ordered.  On reassessment, headache improved.  She was discharged in stable condition.        Final Clinical  Impression(s) / ED Diagnoses Final diagnoses:  Bad headache    Rx / DC Orders ED Discharge Orders     None         Gloris Manchester, MD 02/21/23 1715

## 2023-02-21 NOTE — Discharge Instructions (Signed)
Keep your appointment with your neurologist for ongoing management of your chronic migraines.  Return to the emergency department for any new or worsening symptoms of concern.

## 2023-02-22 DIAGNOSIS — F411 Generalized anxiety disorder: Secondary | ICD-10-CM | POA: Diagnosis not present

## 2023-02-22 DIAGNOSIS — F332 Major depressive disorder, recurrent severe without psychotic features: Secondary | ICD-10-CM | POA: Diagnosis not present

## 2023-03-05 DIAGNOSIS — G43719 Chronic migraine without aura, intractable, without status migrainosus: Secondary | ICD-10-CM | POA: Diagnosis not present

## 2023-03-15 ENCOUNTER — Ambulatory Visit (INDEPENDENT_AMBULATORY_CARE_PROVIDER_SITE_OTHER): Payer: Medicare HMO | Admitting: Gastroenterology

## 2023-03-28 DIAGNOSIS — G43E19 Chronic migraine with aura, intractable, without status migrainosus: Secondary | ICD-10-CM | POA: Diagnosis not present

## 2023-05-16 ENCOUNTER — Other Ambulatory Visit: Payer: Self-pay | Admitting: Family Medicine

## 2023-05-16 DIAGNOSIS — F411 Generalized anxiety disorder: Secondary | ICD-10-CM | POA: Diagnosis not present

## 2023-05-16 DIAGNOSIS — F332 Major depressive disorder, recurrent severe without psychotic features: Secondary | ICD-10-CM | POA: Diagnosis not present

## 2023-05-16 MED ORDER — TRAZODONE HCL 100 MG PO TABS: 200.0000 mg | ORAL_TABLET | Freq: Every evening | ORAL | 3 refills | Status: DC | PRN

## 2023-05-31 DIAGNOSIS — G43719 Chronic migraine without aura, intractable, without status migrainosus: Secondary | ICD-10-CM | POA: Diagnosis not present

## 2023-06-28 ENCOUNTER — Ambulatory Visit: Payer: Self-pay

## 2023-06-28 ENCOUNTER — Telehealth: Payer: Self-pay

## 2023-06-28 NOTE — Telephone Encounter (Signed)
 Copied from CRM [id number]. Topic: Appointments - Appointment Scheduling >> Jun 28, 2023  8:08 AM Alica Antu wrote: Patient/patient representative is calling to schedule an appointment. Patient is requesting annual visit with PCP [place], Gloria NP but it is not allowing me to schedule.  May you please assist.

## 2023-06-28 NOTE — Telephone Encounter (Signed)
 Called patient call went straight to voicemail. Voicemail box full  unable to leave message  No Mychart access since 11/24

## 2023-06-28 NOTE — Telephone Encounter (Signed)
 FYI Only or Action Required?: Action required by provider: referral request.  Patient was last seen in primary care on 11/24/2022 by Del Abron Abt, FNP. Called Nurse Triage reporting Mental Health Problem. Symptoms began several weeks ago. Interventions attempted: Rest, hydration, or home remedies. Symptoms are: unchanged.  Triage Disposition: Call PCP Now  Patient/caregiver understands and will follow disposition?: Yes, will follow disposition  Copied from CRM [id number]. Topic: Clinical - Red Word Triage >> Jun 28, 2023  8:17 AM Alica Antu wrote: Red Word that prompted transfer to Nurse Triage: mental breakdown. Reason for Disposition  Nursing judgment or information in reference  Answer Assessment - Initial Assessment Questions 1. REASON FOR CALL: What is your main concern right now?     Would like referral for mental health, counseling/therapy 5. OTHER SYMPTOMS: Do you have any other new symptoms?     Denies HI/SI,   Pt has placed teenage daughter in a group home and would like someone to talk to. Looking for referral to mental health/counseling services.  Protocols used: No Guideline Available-[facility]

## 2023-06-28 NOTE — Telephone Encounter (Signed)
[facility] referral

## 2023-06-29 ENCOUNTER — Other Ambulatory Visit: Payer: Self-pay

## 2023-06-29 DIAGNOSIS — F3341 Major depressive disorder, recurrent, in partial remission: Secondary | ICD-10-CM

## 2023-06-29 NOTE — Telephone Encounter (Signed)
Mailed appt reminder

## 2023-06-29 NOTE — Telephone Encounter (Signed)
 Done

## 2023-07-06 ENCOUNTER — Ambulatory Visit: Payer: Self-pay

## 2023-07-06 ENCOUNTER — Ambulatory Visit (INDEPENDENT_AMBULATORY_CARE_PROVIDER_SITE_OTHER): Admitting: Family Medicine

## 2023-07-06 ENCOUNTER — Encounter: Payer: Self-pay | Admitting: Family Medicine

## 2023-07-06 VITALS — BP 127/84 | HR 105 | Resp 18 | Ht 66.5 in | Wt 144.1 lb

## 2023-07-06 DIAGNOSIS — F322 Major depressive disorder, single episode, severe without psychotic features: Secondary | ICD-10-CM | POA: Diagnosis not present

## 2023-07-06 DIAGNOSIS — K589 Irritable bowel syndrome without diarrhea: Secondary | ICD-10-CM | POA: Diagnosis not present

## 2023-07-06 DIAGNOSIS — F419 Anxiety disorder, unspecified: Secondary | ICD-10-CM

## 2023-07-06 DIAGNOSIS — R197 Diarrhea, unspecified: Secondary | ICD-10-CM

## 2023-07-06 MED ORDER — DIPHENOXYLATE-ATROPINE 2.5-0.025 MG PO TABS: 1.0000 | ORAL_TABLET | Freq: Four times a day (QID) | ORAL | 0 refills | Status: AC | PRN

## 2023-07-06 NOTE — Telephone Encounter (Signed)
 FYI Only or Action Required?: FYI only for provider.  Patient was last seen in primary care on 11/24/2022 by Terry Wilhelmena Lloyd Hilario, FNP. Called Nurse Triage reporting Diarrhea. Symptoms began several weeks ago. Interventions attempted: OTC medications: Immodium, Rest, hydration, or home remedies, and Dietary changes. Symptoms are: unchanged.  Triage Disposition: See Physician Within 24 Hours  Patient/caregiver understands and will follow disposition?: Yes         Paitent is diahreah and weakenss, stress not able to sleep reguarly.  Reason for Disposition  [1] SEVERE diarrhea (e.g., 7 or more times / day more than normal) AND [2] present > 24 hours (1 day)  Answer Assessment - Initial Assessment Questions 1. DIARRHEA SEVERITY: How bad is the diarrhea? How many more stools have you had in the past 24 hours than normal?    - NO DIARRHEA (SCALE 0)   - MILD (SCALE 1-3): Few loose or mushy BMs; increase of 1-3 stools over normal daily number of stools; mild increase in ostomy output.   -  MODERATE (SCALE 4-7): Increase of 4-6 stools daily over normal; moderate increase in ostomy output.   -  SEVERE (SCALE 8-10; OR WORST POSSIBLE): Increase of 7 or more stools daily over normal; moderate increase in ostomy output; incontinence.     Sever diarrhea  2. ONSET: When did the diarrhea begin?      6 wks (off/on)  3. BM CONSISTENCY: How loose or watery is the diarrhea?      Watery, but has some form occasionally  4. VOMITING: Are you also vomiting? If Yes, ask: How many times in the past 24 hours?      No  5. ABDOMEN PAIN: Are you having any abdomen pain? If Yes, ask: What does it feel like? (e.g., crampy, dull, intermittent, constant)      Tender to touch  6. ABDOMEN PAIN SEVERITY: If present, ask: How bad is the pain?  (e.g., Scale 1-10; mild, moderate, or severe)   - MILD (1-3): doesn't interfere with normal activities, abdomen soft and not tender to touch    -  MODERATE (4-7): interferes with normal activities or awakens from sleep, abdomen tender to touch    - SEVERE (8-10): excruciating pain, doubled over, unable to do any normal activities       6/10 pain, ad has some distended  7. ORAL INTAKE: If vomiting, Have you been able to drink liquids? How much liquids have you had in the past 24 hours?     Drinking fluids  8. HYDRATION: Any signs of dehydration? (e.g., dry mouth [not just dry lips], too weak to stand, dizziness, new weight loss) When did you last urinate?     Has lost 6-7 lbs and Feels weak, concerned for mild dehydration  9. EXPOSURE: Have you traveled to a foreign country recently? Have you been exposed to anyone with diarrhea? Could you have eaten any food that was spoiled?     *No Answer* 10. ANTIBIOTIC USE: Are you taking antibiotics now or have you taken antibiotics in the past 2 months?       No 11. OTHER SYMPTOMS: Do you have any other symptoms? (e.g., fever, blood in stool)       No  12. PREGNANCY: Is there any chance you are pregnant? When was your last menstrual period?       No  Protocols used: Diarrhea-A-AH

## 2023-07-06 NOTE — Progress Notes (Unsigned)
   Martha Aguilar     MRN: [id number]      DOB: March 28, 1970  Chief Complaint  Patient presents with   Diarrhea    Pt complains of loss of appetite, diarrhea, weakness, and mild abdominal pain x6 weeks. Pt thinks it may have been caused from stress of putting her daughter in a group home    [name] is here with severe depression and anxiety , Daughter in a group home from may 13 dueto mentl halth issues, lGIissues  developed since then. Initially  ROS Denies recent fever or chills. Denies sinus pressure, nasal congestion, ear pain or sore throat. Denies chest congestion, productive cough or wheezing. Denies chest pains, palpitations and leg swelling Denies abdominal pain, nausea, vomiting,diarrhea or constipation.   Denies dysuria, frequency, hesitancy or incontinence. Denies joint pain, swelling and limitation in mobility. Denies headaches, seizures, numbness, or tingling. Denies depression, anxiety or insomnia. Denies skin break down or rash.   PE  BP 127/84   Pulse (!) 105   Resp 18   Ht 5' 6.5 (1.689 m)   Wt 144 lb 1.9 oz (65.4 kg)   SpO2 92%   BMI 22.91 kg/m   Patient alert and oriented and in no cardiopulmonary distress.  HEENT: No facial asymmetry, EOMI,     Neck supple .  Chest: Clear to auscultation bilaterally.  CVS: S1, S2 no murmurs, no S3.Regular rate.  ABD: Soft non tender.   Ext: No edema  MS: Adequate ROM spine, shoulders, hips and knees.  Skin: Intact, no ulcerations or rash noted.  Psych: Good eye contact, normal affect. Memory intact not anxious or depressed appearing.  CNS: CN 2-12 intact, power,  normal throughout.no focal deficits noted.   Assessment & Plan  No problem-specific Assessment & Plan notes found for this encounter.

## 2023-07-06 NOTE — Patient Instructions (Addendum)
 F/UJas before with PCP  Lomotil prescribed for as needed use for diarrheah  I am referring you to in house therapist  You need to call and schedule appt with your psychiatrist as your depression is not currently being treated. Reach out to your PCP if you have problems with this   Thanks for choosing [facility], we consider it a privelige to serve you.

## 2023-07-07 ENCOUNTER — Encounter: Payer: Self-pay | Admitting: Family Medicine

## 2023-07-07 ENCOUNTER — Telehealth: Payer: Self-pay | Admitting: Family Medicine

## 2023-07-07 DIAGNOSIS — R197 Diarrhea, unspecified: Secondary | ICD-10-CM | POA: Insufficient documentation

## 2023-07-07 DIAGNOSIS — F322 Major depressive disorder, single episode, severe without psychotic features: Secondary | ICD-10-CM | POA: Insufficient documentation

## 2023-07-07 NOTE — Assessment & Plan Note (Signed)
 Pt to reach out to her Psychiatrist so that she can receive thje treatment she needs . I will also refer her to Therapist and notify PCP of current situation, for sooner follow up

## 2023-07-07 NOTE — Assessment & Plan Note (Signed)
 Needs both medication management , already established with Psych , will refer for therapy

## 2023-07-07 NOTE — Telephone Encounter (Signed)
 Pls call pt. I had mentioned getting labs done at visit , and overlooked putting them in Pls order and let her know to come and get lab work on 6/30 preferably  not stat , cbc and  diff, and cmp , also needs to schedule an appt with her PCP as f/u in next 3 to 6 weeks Dx is diarheah  ?pls ask  Thanks

## 2023-07-07 NOTE — Assessment & Plan Note (Signed)
 Experiencing diarrhea predominance x 6 weeks due to increased  stress and anxiety as she had to place her daughter in a group home,basic labs , CBC and diff , cmp  an d limited lomotil is  prescribed

## 2023-07-07 NOTE — Assessment & Plan Note (Signed)
 Basic labs and lomotil Call in for f/u if symptoms persist or worsen

## 2023-07-09 NOTE — Telephone Encounter (Signed)
 Tried calling, straight to vm, vm full

## 2023-07-11 ENCOUNTER — Other Ambulatory Visit: Payer: Self-pay

## 2023-07-11 DIAGNOSIS — R197 Diarrhea, unspecified: Secondary | ICD-10-CM

## 2023-07-11 NOTE — Telephone Encounter (Signed)
 Sent mychart message

## 2023-07-18 DIAGNOSIS — G43711 Chronic migraine without aura, intractable, with status migrainosus: Secondary | ICD-10-CM | POA: Diagnosis not present

## 2023-07-20 ENCOUNTER — Institutional Professional Consult (permissible substitution): Admitting: Professional Counselor

## 2023-07-27 ENCOUNTER — Institutional Professional Consult (permissible substitution): Admitting: Professional Counselor

## 2023-08-06 DIAGNOSIS — Z79899 Other long term (current) drug therapy: Secondary | ICD-10-CM | POA: Diagnosis not present

## 2023-08-06 DIAGNOSIS — F411 Generalized anxiety disorder: Secondary | ICD-10-CM | POA: Diagnosis not present

## 2023-08-06 DIAGNOSIS — F332 Major depressive disorder, recurrent severe without psychotic features: Secondary | ICD-10-CM | POA: Diagnosis not present

## 2023-08-06 DIAGNOSIS — F1320 Sedative, hypnotic or anxiolytic dependence, uncomplicated: Secondary | ICD-10-CM | POA: Diagnosis not present

## 2023-08-06 DIAGNOSIS — Z5181 Encounter for therapeutic drug level monitoring: Secondary | ICD-10-CM | POA: Diagnosis not present

## 2023-08-06 DIAGNOSIS — F32A Depression, unspecified: Secondary | ICD-10-CM | POA: Diagnosis not present

## 2023-08-10 ENCOUNTER — Institutional Professional Consult (permissible substitution): Admitting: Professional Counselor

## 2023-08-13 ENCOUNTER — Telehealth: Payer: Self-pay | Admitting: Family Medicine

## 2023-08-13 DIAGNOSIS — F1320 Sedative, hypnotic or anxiolytic dependence, uncomplicated: Secondary | ICD-10-CM | POA: Diagnosis not present

## 2023-08-13 DIAGNOSIS — Z79899 Other long term (current) drug therapy: Secondary | ICD-10-CM | POA: Diagnosis not present

## 2023-08-13 DIAGNOSIS — F32A Depression, unspecified: Secondary | ICD-10-CM | POA: Diagnosis not present

## 2023-08-13 DIAGNOSIS — Z5181 Encounter for therapeutic drug level monitoring: Secondary | ICD-10-CM | POA: Diagnosis not present

## 2023-08-13 DIAGNOSIS — F411 Generalized anxiety disorder: Secondary | ICD-10-CM | POA: Diagnosis not present

## 2023-08-13 DIAGNOSIS — F331 Major depressive disorder, recurrent, moderate: Secondary | ICD-10-CM | POA: Diagnosis not present

## 2023-08-13 NOTE — Telephone Encounter (Signed)
 Mailbox full

## 2023-08-13 NOTE — Telephone Encounter (Signed)
 Copied from CRM [id number]. Topic: Appointments - Scheduling Inquiry for Clinic >> Aug 13, 2023  2:30 PM Jasmin G wrote: Reason for CRM: Pt called to make an appt with the counselor Reida Rogue, I tried to schedule but it would not give any availability, please call pt back to schedule at [phone]

## 2023-08-15 DIAGNOSIS — F32A Depression, unspecified: Secondary | ICD-10-CM | POA: Diagnosis not present

## 2023-08-15 DIAGNOSIS — F1320 Sedative, hypnotic or anxiolytic dependence, uncomplicated: Secondary | ICD-10-CM | POA: Diagnosis not present

## 2023-08-15 DIAGNOSIS — Z5181 Encounter for therapeutic drug level monitoring: Secondary | ICD-10-CM | POA: Diagnosis not present

## 2023-08-15 DIAGNOSIS — F411 Generalized anxiety disorder: Secondary | ICD-10-CM | POA: Diagnosis not present

## 2023-08-15 DIAGNOSIS — F331 Major depressive disorder, recurrent, moderate: Secondary | ICD-10-CM | POA: Diagnosis not present

## 2023-08-15 DIAGNOSIS — Z79899 Other long term (current) drug therapy: Secondary | ICD-10-CM | POA: Diagnosis not present

## 2023-08-20 DIAGNOSIS — Z5181 Encounter for therapeutic drug level monitoring: Secondary | ICD-10-CM | POA: Diagnosis not present

## 2023-08-20 DIAGNOSIS — F411 Generalized anxiety disorder: Secondary | ICD-10-CM | POA: Diagnosis not present

## 2023-08-20 DIAGNOSIS — F1320 Sedative, hypnotic or anxiolytic dependence, uncomplicated: Secondary | ICD-10-CM | POA: Diagnosis not present

## 2023-08-20 DIAGNOSIS — Z79899 Other long term (current) drug therapy: Secondary | ICD-10-CM | POA: Diagnosis not present

## 2023-08-20 DIAGNOSIS — F331 Major depressive disorder, recurrent, moderate: Secondary | ICD-10-CM | POA: Diagnosis not present

## 2023-08-20 DIAGNOSIS — F32A Depression, unspecified: Secondary | ICD-10-CM | POA: Diagnosis not present

## 2023-08-21 DIAGNOSIS — G43719 Chronic migraine without aura, intractable, without status migrainosus: Secondary | ICD-10-CM | POA: Diagnosis not present

## 2023-08-21 DIAGNOSIS — G43711 Chronic migraine without aura, intractable, with status migrainosus: Secondary | ICD-10-CM | POA: Diagnosis not present

## 2023-08-27 DIAGNOSIS — Z5181 Encounter for therapeutic drug level monitoring: Secondary | ICD-10-CM | POA: Diagnosis not present

## 2023-08-27 DIAGNOSIS — F331 Major depressive disorder, recurrent, moderate: Secondary | ICD-10-CM | POA: Diagnosis not present

## 2023-08-27 DIAGNOSIS — Z79899 Other long term (current) drug therapy: Secondary | ICD-10-CM | POA: Diagnosis not present

## 2023-08-27 DIAGNOSIS — F1320 Sedative, hypnotic or anxiolytic dependence, uncomplicated: Secondary | ICD-10-CM | POA: Diagnosis not present

## 2023-08-27 DIAGNOSIS — F32A Depression, unspecified: Secondary | ICD-10-CM | POA: Diagnosis not present

## 2023-08-27 DIAGNOSIS — F411 Generalized anxiety disorder: Secondary | ICD-10-CM | POA: Diagnosis not present

## 2023-08-29 DIAGNOSIS — F1320 Sedative, hypnotic or anxiolytic dependence, uncomplicated: Secondary | ICD-10-CM | POA: Diagnosis not present

## 2023-08-29 DIAGNOSIS — F32A Depression, unspecified: Secondary | ICD-10-CM | POA: Diagnosis not present

## 2023-08-29 DIAGNOSIS — Z79899 Other long term (current) drug therapy: Secondary | ICD-10-CM | POA: Diagnosis not present

## 2023-08-29 DIAGNOSIS — F411 Generalized anxiety disorder: Secondary | ICD-10-CM | POA: Diagnosis not present

## 2023-08-29 DIAGNOSIS — Z5181 Encounter for therapeutic drug level monitoring: Secondary | ICD-10-CM | POA: Diagnosis not present

## 2023-08-29 DIAGNOSIS — F331 Major depressive disorder, recurrent, moderate: Secondary | ICD-10-CM | POA: Diagnosis not present

## 2023-09-12 DIAGNOSIS — Z79899 Other long term (current) drug therapy: Secondary | ICD-10-CM | POA: Diagnosis not present

## 2023-09-12 DIAGNOSIS — Z5181 Encounter for therapeutic drug level monitoring: Secondary | ICD-10-CM | POA: Diagnosis not present

## 2023-09-12 DIAGNOSIS — F331 Major depressive disorder, recurrent, moderate: Secondary | ICD-10-CM | POA: Diagnosis not present

## 2023-09-12 DIAGNOSIS — F411 Generalized anxiety disorder: Secondary | ICD-10-CM | POA: Diagnosis not present

## 2023-09-12 DIAGNOSIS — F32A Depression, unspecified: Secondary | ICD-10-CM | POA: Diagnosis not present

## 2023-09-12 DIAGNOSIS — F1320 Sedative, hypnotic or anxiolytic dependence, uncomplicated: Secondary | ICD-10-CM | POA: Diagnosis not present

## 2023-09-14 ENCOUNTER — Other Ambulatory Visit: Payer: Self-pay | Admitting: Family Medicine

## 2023-09-20 ENCOUNTER — Encounter (INDEPENDENT_AMBULATORY_CARE_PROVIDER_SITE_OTHER): Payer: Self-pay | Admitting: *Deleted

## 2023-09-20 ENCOUNTER — Ambulatory Visit: Payer: Self-pay

## 2023-09-20 ENCOUNTER — Encounter: Admitting: Family Medicine

## 2023-09-20 NOTE — Telephone Encounter (Signed)
 FYI Only or Action Required?: Action required by provider: request for appointment.  Patient was last seen in primary care on 07/06/2023 by Martha Rollene BRAVO, MD.  Called Nurse Triage reporting Chest Pain.  Symptoms began several days ago.  Interventions attempted: OTC medications: Mucomist, sinus/cold tablets.  Symptoms are: sneezing, nausea, chest heaviness intermittent lasts a few seconds stable.  Triage Disposition: See Physician Within 24 Hours  Patient/caregiver understands and will follow disposition?: No          Copied from CRM #[id number]. Topic: Clinical - Red Word Triage >> Sep 20, 2023 12:35 PM Turkey B wrote: Patient had chest heaviness 3 days , not feeling well with bronchitis Reason for Disposition  [1] Chest pain lasts < 5 minutes AND [2] NO chest pain or cardiac symptoms (e.g., breathing difficulty, sweating) now  (Exception: Chest pains that last only a few seconds.)  Answer Assessment - Initial Assessment Questions Patient states I know what this is and that she gets bronchitis and treats it at home and then if it doesn't clear up or she develops a fever she will come in for antibiotics. Patient refused acute visit and due to Book it error unable to reschedule her wellness exam.   1. LOCATION: Where does it hurt?       The middle.  2. RADIATION: Does the pain go anywhere else? (e.g., into neck, jaw, arms, back)     No.  3. ONSET: When did the chest pain begin? (Minutes, hours or days)      3 days ago.  4. PATTERN: Does the pain come and go, or has it been constant since it started?  Does it get worse with exertion?      Comes and goes. Not currently experiencing it now. She states it gets worse when she gets up and moves around.  5. DURATION: How long does it last (e.g., seconds, minutes, hours)     Seconds.  6. SEVERITY: How bad is the pain?  (e.g., Scale 1-10; mild, moderate, or severe)     Heaviness, no pain at this time.  7.  CARDIAC RISK FACTORS: Do you have any history of heart problems or risk factors for heart disease? (e.g., angina, prior heart attack; diabetes, high blood pressure, high cholesterol, smoker, or strong family history of heart disease)     No.  8. PULMONARY RISK FACTORS: Do you have any history of lung disease?  (e.g., blood clots in lung, asthma, emphysema, birth control pills)     No.  9. CAUSE: What do you think is causing the chest pain?     Patient states she knows what this is and it is bronchitis.  10. OTHER SYMPTOMS: Do you have any other symptoms? (e.g., dizziness, nausea, vomiting, sweating, fever, difficulty breathing, cough)       Denies cough, SOB, fever, dizziness, vomiting. States some nausea, sneezing.  11. PREGNANCY: Is there any chance you are pregnant? When was your last menstrual period?       N/A.  Protocols used: Chest Pain-A-AH

## 2023-09-21 NOTE — Telephone Encounter (Signed)
 Called the patient, mailbox is full, sent mychart message to call the office and the call center can get her rescheduled for her appointment

## 2023-09-24 DIAGNOSIS — Z5181 Encounter for therapeutic drug level monitoring: Secondary | ICD-10-CM | POA: Diagnosis not present

## 2023-09-24 DIAGNOSIS — F1320 Sedative, hypnotic or anxiolytic dependence, uncomplicated: Secondary | ICD-10-CM | POA: Diagnosis not present

## 2023-09-24 DIAGNOSIS — F32A Depression, unspecified: Secondary | ICD-10-CM | POA: Diagnosis not present

## 2023-09-24 DIAGNOSIS — Z79899 Other long term (current) drug therapy: Secondary | ICD-10-CM | POA: Diagnosis not present

## 2023-09-24 DIAGNOSIS — F411 Generalized anxiety disorder: Secondary | ICD-10-CM | POA: Diagnosis not present

## 2023-09-24 DIAGNOSIS — F331 Major depressive disorder, recurrent, moderate: Secondary | ICD-10-CM | POA: Diagnosis not present

## 2023-09-25 DIAGNOSIS — G43711 Chronic migraine without aura, intractable, with status migrainosus: Secondary | ICD-10-CM | POA: Diagnosis not present

## 2023-09-26 DIAGNOSIS — F1320 Sedative, hypnotic or anxiolytic dependence, uncomplicated: Secondary | ICD-10-CM | POA: Diagnosis not present

## 2023-09-26 DIAGNOSIS — F331 Major depressive disorder, recurrent, moderate: Secondary | ICD-10-CM | POA: Diagnosis not present

## 2023-09-26 DIAGNOSIS — F32A Depression, unspecified: Secondary | ICD-10-CM | POA: Diagnosis not present

## 2023-09-26 DIAGNOSIS — Z5181 Encounter for therapeutic drug level monitoring: Secondary | ICD-10-CM | POA: Diagnosis not present

## 2023-09-26 DIAGNOSIS — Z79899 Other long term (current) drug therapy: Secondary | ICD-10-CM | POA: Diagnosis not present

## 2023-09-26 DIAGNOSIS — F411 Generalized anxiety disorder: Secondary | ICD-10-CM | POA: Diagnosis not present

## 2023-10-15 DIAGNOSIS — F32A Depression, unspecified: Secondary | ICD-10-CM | POA: Diagnosis not present

## 2023-10-15 DIAGNOSIS — F1320 Sedative, hypnotic or anxiolytic dependence, uncomplicated: Secondary | ICD-10-CM | POA: Diagnosis not present

## 2023-10-15 DIAGNOSIS — Z5181 Encounter for therapeutic drug level monitoring: Secondary | ICD-10-CM | POA: Diagnosis not present

## 2023-10-15 DIAGNOSIS — Z79899 Other long term (current) drug therapy: Secondary | ICD-10-CM | POA: Diagnosis not present

## 2023-10-15 DIAGNOSIS — F411 Generalized anxiety disorder: Secondary | ICD-10-CM | POA: Diagnosis not present

## 2023-10-15 DIAGNOSIS — F331 Major depressive disorder, recurrent, moderate: Secondary | ICD-10-CM | POA: Diagnosis not present

## 2023-10-24 ENCOUNTER — Encounter (INDEPENDENT_AMBULATORY_CARE_PROVIDER_SITE_OTHER): Payer: Self-pay | Admitting: Gastroenterology

## 2023-10-29 DIAGNOSIS — H00011 Hordeolum externum right upper eyelid: Secondary | ICD-10-CM | POA: Diagnosis not present

## 2023-10-29 DIAGNOSIS — H1031 Unspecified acute conjunctivitis, right eye: Secondary | ICD-10-CM | POA: Diagnosis not present

## 2023-10-29 DIAGNOSIS — F331 Major depressive disorder, recurrent, moderate: Secondary | ICD-10-CM | POA: Diagnosis not present

## 2023-10-29 DIAGNOSIS — F1320 Sedative, hypnotic or anxiolytic dependence, uncomplicated: Secondary | ICD-10-CM | POA: Diagnosis not present

## 2023-10-29 DIAGNOSIS — F411 Generalized anxiety disorder: Secondary | ICD-10-CM | POA: Diagnosis not present

## 2023-10-29 DIAGNOSIS — F32A Depression, unspecified: Secondary | ICD-10-CM | POA: Diagnosis not present

## 2023-10-29 DIAGNOSIS — Z5181 Encounter for therapeutic drug level monitoring: Secondary | ICD-10-CM | POA: Diagnosis not present

## 2023-10-31 DIAGNOSIS — F331 Major depressive disorder, recurrent, moderate: Secondary | ICD-10-CM | POA: Diagnosis not present

## 2023-10-31 DIAGNOSIS — Z79899 Other long term (current) drug therapy: Secondary | ICD-10-CM | POA: Diagnosis not present

## 2023-10-31 DIAGNOSIS — F1320 Sedative, hypnotic or anxiolytic dependence, uncomplicated: Secondary | ICD-10-CM | POA: Diagnosis not present

## 2023-10-31 DIAGNOSIS — Z5181 Encounter for therapeutic drug level monitoring: Secondary | ICD-10-CM | POA: Diagnosis not present

## 2023-10-31 DIAGNOSIS — F411 Generalized anxiety disorder: Secondary | ICD-10-CM | POA: Diagnosis not present

## 2023-10-31 DIAGNOSIS — F32A Depression, unspecified: Secondary | ICD-10-CM | POA: Diagnosis not present

## 2023-11-02 ENCOUNTER — Emergency Department (HOSPITAL_COMMUNITY)
Admission: EM | Admit: 2023-11-02 | Discharge: 2023-11-02 | Disposition: A | Discharge status: Home / Self Care | Attending: Emergency Medicine | Admitting: Emergency Medicine

## 2023-11-02 ENCOUNTER — Encounter (HOSPITAL_COMMUNITY): Payer: Self-pay

## 2023-11-02 ENCOUNTER — Other Ambulatory Visit: Payer: Self-pay

## 2023-11-02 DIAGNOSIS — E039 Hypothyroidism, unspecified: Secondary | ICD-10-CM | POA: Diagnosis not present

## 2023-11-02 DIAGNOSIS — R519 Headache, unspecified: Secondary | ICD-10-CM | POA: Diagnosis present

## 2023-11-02 DIAGNOSIS — Z79899 Other long term (current) drug therapy: Secondary | ICD-10-CM | POA: Insufficient documentation

## 2023-11-02 DIAGNOSIS — G43909 Migraine, unspecified, not intractable, without status migrainosus: Secondary | ICD-10-CM | POA: Diagnosis not present

## 2023-11-02 DIAGNOSIS — G43009 Migraine without aura, not intractable, without status migrainosus: Secondary | ICD-10-CM

## 2023-11-02 MED ORDER — MAGNESIUM SULFATE 2 GM/50ML IV SOLN
2.0000 g | Freq: Once | INTRAVENOUS | Status: AC
  Administered 2023-11-02: 2 g via INTRAVENOUS
  Filled 2023-11-02: qty 50

## 2023-11-02 MED ORDER — LACTATED RINGERS IV BOLUS
1000.0000 mL | Freq: Once | INTRAVENOUS | Status: AC
  Administered 2023-11-02: 1000 mL via INTRAVENOUS

## 2023-11-02 MED ORDER — KETOROLAC TROMETHAMINE 15 MG/ML IJ SOLN
15.0000 mg | Freq: Once | INTRAMUSCULAR | Status: AC
  Administered 2023-11-02: 15 mg via INTRAVENOUS
  Filled 2023-11-02: qty 1

## 2023-11-02 MED ORDER — ACETAMINOPHEN 500 MG PO TABS
1000.0000 mg | ORAL_TABLET | Freq: Once | ORAL | Status: AC
  Administered 2023-11-02: 1000 mg via ORAL
  Filled 2023-11-02: qty 2

## 2023-11-02 MED ORDER — METOCLOPRAMIDE HCL 5 MG/ML IJ SOLN
10.0000 mg | Freq: Once | INTRAMUSCULAR | Status: AC
Start: 2023-11-02 — End: 2023-11-02
  Administered 2023-11-02: 10 mg via INTRAVENOUS
  Filled 2023-11-02: qty 2

## 2023-11-02 MED ORDER — SODIUM CHLORIDE 0.9 % IV SOLN: 25.0000 mg | INTRAVENOUS | Status: DC | PRN

## 2023-11-02 MED ORDER — DIPHENHYDRAMINE HCL 50 MG/ML IJ SOLN
25.0000 mg | INTRAMUSCULAR | Status: DC | PRN
  Administered 2023-11-02: 25 mg via INTRAVENOUS
  Filled 2023-11-02: qty 1

## 2023-11-02 NOTE — ED Provider Notes (Signed)
 [facility] EMERGENCY DEPARTMENT AT [facility] Provider Note   CSN: [id number] Arrival date & time: 11/02/23  1458     History  Chief Complaint  Patient presents with   Headache    Martha Aguilar is a 53 y.o. female with chronic migraine, hypothyroidism, bilateral occipital neuralgia, fibromuscular dysplasia, IBS, interstitial cystitis, PTSD, anxiety, major depressive disorder who presents with migraine. Reports a hx of migraines, states that she is here for a gradual onset migraine, consistent with her chronic migraine symptoms.  Denies asymmetric N/T/weakness, slurred speech, visual changes, confusion. Feels like her normal migraine just worse. Follows w/ neurology and gets Vypeti infusions. Located in the front of her head.  Gradual onset yesterday at 9 PM.  No positional symptoms.  No aura.  No fever/chills.  Some associated nausea with vomiting.  Tried her Imitrex  this morning which did not help.  Also tried Benadryl  and Klonopin this morning which did not help.  Rates her current pain 9 out of 10.   Past Medical History:  Diagnosis Date   Constipation 09/05/2018   Dehydration 10/19/2011   Fibromuscular dysplasia 08/25/2020   Headache(784.0)    migraines   Hypothyroidism    IBS (irritable bowel syndrome) 11/16/2020   Interstitial cystitis 09/14/2020   Migraines    Occipital neuralgia    Raynaud disease 02/16/2021   Venous stenosis of upper extremity        Home Medications Prior to Admission medications   Medication Sig Start Date End Date Taking? Authorizing Provider  amitriptyline  (ELAVIL ) 75 MG tablet Take 75 mg by mouth at bedtime.    [provider]  clonazePAM (KLONOPIN) 0.5 MG tablet Take 0.5 mg by mouth daily as needed for anxiety.    [provider]  diphenoxylate -atropine  (LOMOTIL ) 2.5-0.025 MG tablet Take 1 tablet by mouth 4 (four) times daily as needed for diarrhea or loose stools. 07/06/23   Antonetta Rollene BRAVO, MD  doxycycline  (VIBRA-TABS) 100 MG tablet Take 100 mg by mouth 2 (two) times daily. 10/29/23   [provider]  Eptinezumab -jjmr (VYEPTI ) 100 MG/ML injection Inject into the vein. Every three months.    [provider]  estradiol  (ESTRACE ) 1 MG tablet Take 1 tablet by mouth daily. 12/19/21   [provider]  gabapentin  (NEURONTIN ) 800 MG tablet Take 1 tablet (800 mg total) by mouth 3 (three) times daily. Patient taking differently: Take 800 mg by mouth at bedtime. 06/15/20   Skeet Juliene SAUNDERS, DO  levothyroxine  (SYNTHROID ) 175 MCG tablet Take 175 mcg by mouth every morning. Except does not take on Sunday. 10/21/20   [provider]  linaclotide  (LINZESS ) 145 MCG CAPS capsule Take 1 capsule (145 mcg total) by mouth daily before breakfast. Patient not taking: Reported on 07/06/2023 12/14/22   Castaneda Mayorga, Daniel, MD  ofloxacin (OCUFLOX) 0.3 % ophthalmic solution Place 1 drop into both eyes 4 (four) times daily. 10/29/23   [provider]  OnabotulinumtoxinA  (BOTOX  IJ) Inject into the skin every 3 (three) months. Migraine prn    [provider]  ondansetron  (ZOFRAN -ODT) 4 MG disintegrating tablet Take 4 mg by mouth every 8 (eight) hours as needed. 10/30/20   [provider]  progesterone (PROMETRIUM) 100 MG capsule Take 100 mg by mouth at bedtime. 10/19/23   [provider]  SPRAVATO, 84 MG DOSE, 28 MG/DEVICE SOPK Place 84 mg into both nostrils 2 (two) times a week.    [provider]  SUMAtriptan  (IMITREX ) 100 MG tablet Take  1 tablet (100 mg total) by mouth every 2 (two) hours as needed for migraine. May repeat in 2 hours if headache persists or recurs. 06/13/21   Idol, Julie, PA-C  SUMAtriptan  6 MG/0.5ML SOAJ Inject 6 mg into the skin as needed (Migraine).  07/19/18   [provider]  topiramate  (TOPAMAX ) 50 MG tablet Take 50 mg by mouth daily. 07/18/23   [provider]  traZODone  (DESYREL ) 100 MG tablet TAKE TWO TABLETS BY  MOUTH AT BEDTIME AS NEEDED 09/14/23   Bacchus, Gloria Z, FNP  valACYclovir  (VALTREX ) 1000 MG tablet Take 1 tablet (1,000 mg total) by mouth 2 (two) times daily. 05/09/17   Cresenzo-Dishmon, Cathlean, CNM  zolpidem  (AMBIEN ) 10 MG tablet Take 10 mg by mouth at bedtime as needed. for insomnia 01/15/13   [provider]      Allergies    Aimovig [erenumab], Compazine  [prochlorperazine ], Amoxicillin-pot clavulanate, Carbamazepine, and Oxcarbazepine  er    Review of Systems   Review of Systems A 10 point review of systems was performed and is negative unless otherwise reported in HPI.  Physical Exam Updated Vital Signs BP 117/70   Pulse 69   Temp (!) 97.5 F (36.4 C) (Oral)   Resp 17   Ht 5' 7 (1.702 m)   Wt 62.6 kg   SpO2 100%   BMI 21.61 kg/m  Physical Exam General: Normal appearing female, lying in bed.  HEENT: PERRLA, EOMI, no nystagmus, sclera anicteric, MMM, trachea midline.  Cardiology: RRR, no murmurs/rubs/gallops. BL radial and DP pulses equal bilaterally.  Resp: Normal respiratory rate and effort. CTAB, no wheezes, rhonchi, crackles.  Abd: Soft, non-tender, non-distended. No rebound tenderness or guarding.  GU: Deferred. MSK: No peripheral edema or signs of trauma. Extremities without deformity or TTP. No cyanosis or clubbing. Skin: warm, dry.  Neuro: A&Ox4, CNs II-XII grossly intact.  5 out of 5 strength in all extremities. Sensation grossly intact.  Normal speech Psych: Normal mood and affect.   ED Results / Procedures / Treatments   Labs (all labs ordered are listed, but only abnormal results are displayed) Labs Reviewed - No data to display  EKG None  Radiology No results found.  Procedures Procedures    Medications Ordered in ED Medications  diphenhydrAMINE  (BENADRYL ) injection 25 mg (25 mg Intravenous Given 11/02/23 1750)  lactated ringers  bolus 1,000 mL (0 mLs Intravenous Stopped 11/02/23 1846)  acetaminophen  (TYLENOL ) tablet 1,000 mg (1,000 mg Oral  Given 11/02/23 1646)  metoCLOPramide  (REGLAN ) injection 10 mg (10 mg Intravenous Given 11/02/23 1644)  ketorolac  (TORADOL ) 15 MG/ML injection 15 mg (15 mg Intravenous Given 11/02/23 1643)  magnesium  sulfate IVPB 2 g 50 mL (0 g Intravenous Stopped 11/02/23 1748)    ED Course/ Medical Decision Making/ A&P                          Medical Decision Making Risk OTC drugs. Prescription drug management.    This patient presents to the ED for concern of migraine, this involves an extensive number of treatment options, and is a complaint that carries with it a high risk of complications and morbidity.  I considered the following differential and admission for this acute, potentially life threatening condition.  Patient is overall well-appearing.   MDM:    Patient is overall very well-appearing, HDS, non-toxic, afebrile.   Unlikely SAH: headache is non thunderclap Unlikely Subdural/epidural hematoma: no history of trauma, no anticoagulation. Unlikely Meningitis: afebrile, no meningismus. Unlikely Temporal arteritis:  pt < 63 years old.  no tenderness in temporal area Unlikely Acute angle glaucoma: PERRLA, no eye pain. Unlikely Carbon Monoxide Poisoning: no other house members with similar symptoms.  Most likely 2/2 patient's chronic migraines as she states this feels the same. Also considered tension headache, or headache of non-emergent etiology. No focal neurological symptoms. Neuro exam is benign. Headache is gradual, non-maximal at onset and similar to headaches in the past.   Plan: Will give headache cocktail and reexamine.  Patient's BP on arrival is 117/41 with MAP 63, but patient's BPs are normal without intervention after this. Believe likely erroneous value.   Clinical Course as of 11/02/23 1847  Fri Nov 02, 2023  1752 Pain decreased to 6/10 on reassessment [HN]  1846 Pt's pain now 3/10. She states she is raedy to be discharged. She states she has appt coming up with her  neurologist and also a botox  appt coming up for her migraines. Patient well-appearing and HDS. DC w/ discharge instructions/return precautions. All questions answered to patient's satisfaction.   [HN]    Clinical Course User Index [HN] Franklyn Sid SAILOR, MD    Labs: I Ordered, and personally interpreted labs.  The pertinent results include:   Additional history obtained from chart review, mother at bedside.   Reevaluation: After the interventions noted above, I reevaluated the patient and found that they have :improved  Social Determinants of Health:  lives independently  Disposition:  DC  Co morbidities that complicate the patient evaluation  Past Medical History:  Diagnosis Date   Constipation 09/05/2018   Dehydration 10/19/2011   Fibromuscular dysplasia 08/25/2020   Headache(784.0)    migraines   Hypothyroidism    IBS (irritable bowel syndrome) 11/16/2020   Interstitial cystitis 09/14/2020   Migraines    Occipital neuralgia    Raynaud disease 02/16/2021   Venous stenosis of upper extremity      Medicines Meds ordered this encounter  Medications   lactated ringers  bolus 1,000 mL   acetaminophen  (TYLENOL ) tablet 1,000 mg   metoCLOPramide  (REGLAN ) injection 10 mg   DISCONTD: diphenhydrAMINE  (BENADRYL ) 25 mg in sodium chloride  0.9 % 50 mL IVPB   ketorolac  (TORADOL ) 15 MG/ML injection 15 mg   magnesium  sulfate IVPB 2 g 50 mL   diphenhydrAMINE  (BENADRYL ) injection 25 mg    I have reviewed the patients home medicines and have made adjustments as needed  Problem List / ED Course: Problem List Items Addressed This Visit       Cardiovascular and Mediastinum   Migraine - Primary   Relevant Medications   clonazePAM (KLONOPIN) 0.5 MG tablet   SPRAVATO, 84 MG DOSE, 28 MG/DEVICE SOPK   amitriptyline  (ELAVIL ) 75 MG tablet   topiramate  (TOPAMAX ) 50 MG tablet                This note was created using dictation software, which may contain spelling or  grammatical errors.    Franklyn Sid SAILOR, MD 11/02/23 [phone]

## 2023-11-02 NOTE — Discharge Instructions (Signed)
 Thank you for coming to [facility] Emergency Department. You were seen for migraine, and your symptoms improved after a headache cocktail. Please make an appointment to follow up with your neurologist within 1-2 weeks. Please keep a diary of your headache symptoms.  Do not hesitate to return to the ED or call 911 if you experience: -Worsening symptoms -Visual changes, worsening headache, asymmetric numbness/tingling/weakness -Slurred speech or confusion -Acute onset severe thunderclap headache -Lightheadedness, passing out -Fevers/chills -Anything else that concerns you

## 2023-11-02 NOTE — ED Triage Notes (Signed)
 Pt to er, pt states that she has a hx of migraines, states that she is here for a migraine, states that light and noise makes her pain worse.  Denies weakness or confusion.

## 2023-11-06 DIAGNOSIS — F411 Generalized anxiety disorder: Secondary | ICD-10-CM | POA: Diagnosis not present

## 2023-11-06 DIAGNOSIS — F32A Depression, unspecified: Secondary | ICD-10-CM | POA: Diagnosis not present

## 2023-11-14 DIAGNOSIS — Z5181 Encounter for therapeutic drug level monitoring: Secondary | ICD-10-CM | POA: Diagnosis not present

## 2023-11-14 DIAGNOSIS — F1320 Sedative, hypnotic or anxiolytic dependence, uncomplicated: Secondary | ICD-10-CM | POA: Diagnosis not present

## 2023-11-14 DIAGNOSIS — F32A Depression, unspecified: Secondary | ICD-10-CM | POA: Diagnosis not present

## 2023-11-14 DIAGNOSIS — F331 Major depressive disorder, recurrent, moderate: Secondary | ICD-10-CM | POA: Diagnosis not present

## 2023-11-14 DIAGNOSIS — F411 Generalized anxiety disorder: Secondary | ICD-10-CM | POA: Diagnosis not present

## 2023-11-15 DIAGNOSIS — G43719 Chronic migraine without aura, intractable, without status migrainosus: Secondary | ICD-10-CM | POA: Diagnosis not present

## 2023-11-15 DIAGNOSIS — F411 Generalized anxiety disorder: Secondary | ICD-10-CM | POA: Diagnosis not present

## 2023-11-15 DIAGNOSIS — F331 Major depressive disorder, recurrent, moderate: Secondary | ICD-10-CM | POA: Diagnosis not present

## 2023-11-20 DIAGNOSIS — G43719 Chronic migraine without aura, intractable, without status migrainosus: Secondary | ICD-10-CM | POA: Diagnosis not present

## 2023-11-26 DIAGNOSIS — F331 Major depressive disorder, recurrent, moderate: Secondary | ICD-10-CM | POA: Diagnosis not present

## 2023-11-26 DIAGNOSIS — F411 Generalized anxiety disorder: Secondary | ICD-10-CM | POA: Diagnosis not present

## 2023-11-26 DIAGNOSIS — F1320 Sedative, hypnotic or anxiolytic dependence, uncomplicated: Secondary | ICD-10-CM | POA: Diagnosis not present

## 2023-11-26 DIAGNOSIS — Z5181 Encounter for therapeutic drug level monitoring: Secondary | ICD-10-CM | POA: Diagnosis not present

## 2023-11-26 DIAGNOSIS — Z79899 Other long term (current) drug therapy: Secondary | ICD-10-CM | POA: Diagnosis not present

## 2023-12-19 ENCOUNTER — Telehealth: Payer: Self-pay | Admitting: Family Medicine

## 2023-12-19 NOTE — Telephone Encounter (Signed)
 Left vm for patient to call us  back to get her appt rescheduled with brian

## 2023-12-19 NOTE — Telephone Encounter (Signed)
 Copied from CRM #[id number]. Topic: Appointments - Scheduling Inquiry for Clinic >> Dec 19, 2023  1:15 PM Tobias L wrote: Reason for CRM: Patient inquiring if she can still schedule appointment with Redell Corn, patient has missed two appointments.  Requesting callback: [phone]

## 2023-12-24 ENCOUNTER — Encounter (HOSPITAL_COMMUNITY): Payer: Self-pay

## 2023-12-24 ENCOUNTER — Emergency Department (HOSPITAL_COMMUNITY)
Admission: EM | Admit: 2023-12-24 | Discharge: 2023-12-24 | Disposition: A | Discharge status: Home / Self Care | Attending: Emergency Medicine | Admitting: Emergency Medicine

## 2023-12-24 ENCOUNTER — Other Ambulatory Visit: Payer: Self-pay

## 2023-12-24 DIAGNOSIS — G43919 Migraine, unspecified, intractable, without status migrainosus: Secondary | ICD-10-CM

## 2023-12-24 DIAGNOSIS — G43909 Migraine, unspecified, not intractable, without status migrainosus: Secondary | ICD-10-CM | POA: Diagnosis not present

## 2023-12-24 DIAGNOSIS — R519 Headache, unspecified: Secondary | ICD-10-CM | POA: Diagnosis present

## 2023-12-24 MED ORDER — METHYLPREDNISOLONE SODIUM SUCC 125 MG IJ SOLR
125.0000 mg | Freq: Once | INTRAMUSCULAR | Status: AC
  Administered 2023-12-24: 20:00:00 125 mg via INTRAVENOUS
  Filled 2023-12-24: qty 2

## 2023-12-24 MED ORDER — METOCLOPRAMIDE HCL 5 MG/ML IJ SOLN
10.0000 mg | Freq: Once | INTRAMUSCULAR | Status: AC
  Administered 2023-12-24: 20:00:00 10 mg via INTRAVENOUS
  Filled 2023-12-24: qty 2

## 2023-12-24 MED ORDER — SODIUM CHLORIDE 0.9 % IV BOLUS
1000.0000 mL | Freq: Once | INTRAVENOUS | Status: AC
  Administered 2023-12-24: 20:00:00 1000 mL via INTRAVENOUS

## 2023-12-24 MED ORDER — KETOROLAC TROMETHAMINE 15 MG/ML IJ SOLN
15.0000 mg | Freq: Once | INTRAMUSCULAR | Status: AC
  Administered 2023-12-24: 20:00:00 15 mg via INTRAVENOUS
  Filled 2023-12-24: qty 1

## 2023-12-24 MED ORDER — DIPHENHYDRAMINE HCL 50 MG/ML IJ SOLN
25.0000 mg | Freq: Once | INTRAMUSCULAR | Status: AC
  Administered 2023-12-24: 20:00:00 25 mg via INTRAVENOUS
  Filled 2023-12-24: qty 1

## 2023-12-24 NOTE — ED Provider Notes (Signed)
 [name] Provider Note   CSN: [id number] Arrival date & time: 12/24/23  8187     Patient presents with: Migraine   Martha Aguilar is a 53 y.o. female.  Patient is a 53 year old female with a history of migraines who presents to the ED for increasing migraine that began around 5 AM this morning.  Patient notes that the migraine has continued to get worse throughout the day and is pretty severe.  Notes she does feel this is similar to her previous migraines but they do normally not get this bad at this fast.  She states she has had a migraine since she was 52 years old and is followed by neurology at [facility].  She is on several medications including Botox  and triptan.  Notes she has light sensitivity and nausea/vomiting.  Denies dizziness, vision changes, numbness/tingling/weakness.  No further complaints.    Migraine Associated symptoms include headaches.       Prior to Admission medications  Medication Sig Start Date End Date Taking? Authorizing Provider  amitriptyline  (ELAVIL ) 75 MG tablet Take 75 mg by mouth at bedtime.    [provider]  clonazePAM (KLONOPIN) 0.5 MG tablet Take 0.5 mg by mouth daily as needed for anxiety.    [provider]  diphenoxylate -atropine  (LOMOTIL ) 2.5-0.025 MG tablet Take 1 tablet by mouth 4 (four) times daily as needed for diarrhea or loose stools. 07/06/23   Antonetta Rollene BRAVO, MD  doxycycline (VIBRA-TABS) 100 MG tablet Take 100 mg by mouth 2 (two) times daily. 10/29/23   [provider]  Eptinezumab -jjmr (VYEPTI ) 100 MG/ML injection Inject into the vein. Every three months.    [provider]  estradiol  (ESTRACE ) 1 MG tablet Take 1 tablet by mouth daily. 12/19/21   [provider]  gabapentin  (NEURONTIN ) 800 MG tablet Take 1 tablet (800 mg total) by mouth 3 (three) times daily. Patient taking differently: Take 800 mg by mouth at bedtime. 06/15/20   Skeet Juliene SAUNDERS, DO  levothyroxine  (SYNTHROID ) 175 MCG tablet Take 175 mcg by mouth every morning. Except does not take on Sunday. 10/21/20   [provider]  linaclotide  (LINZESS ) 145 MCG CAPS capsule Take 1 capsule (145 mcg total) by mouth daily before breakfast. Patient not taking: Reported on 07/06/2023 12/14/22   Castaneda Mayorga, Daniel, MD  ofloxacin (OCUFLOX) 0.3 % ophthalmic solution Place 1 drop into both eyes 4 (four) times daily. 10/29/23   [provider]  OnabotulinumtoxinA  (BOTOX  IJ) Inject into the skin every 3 (three) months. Migraine prn    [provider]  ondansetron  (ZOFRAN -ODT) 4 MG disintegrating tablet Take 4 mg by mouth every 8 (eight) hours as needed. 10/30/20   [provider]  progesterone (PROMETRIUM) 100 MG capsule Take 100 mg by mouth at bedtime. 10/19/23   [provider]  SPRAVATO, 84 MG DOSE, 28 MG/DEVICE SOPK Place 84 mg into both nostrils 2 (two) times a week.    [provider]  SUMAtriptan  (IMITREX ) 100 MG tablet Take 1 tablet (100 mg total) by mouth every 2 (two) hours as needed for migraine. May repeat in 2 hours if headache persists or recurs. 06/13/21   Idol, Julie, PA-C  SUMAtriptan  6 MG/0.5ML SOAJ Inject 6 mg into the skin as needed (Migraine).  07/19/18   [provider]  topiramate  (TOPAMAX ) 50 MG tablet Take 50 mg by mouth daily. 07/18/23   [provider]  traZODone  (DESYREL ) 100 MG tablet TAKE TWO TABLETS  BY MOUTH AT BEDTIME AS NEEDED 09/14/23   Bacchus, Gloria Z, FNP  valACYclovir  (VALTREX ) 1000 MG tablet Take 1 tablet (1,000 mg total) by mouth 2 (two) times daily. 05/09/17   Cresenzo-Dishmon, Cathlean, CNM  zolpidem  (AMBIEN ) 10 MG tablet Take 10 mg by mouth at bedtime as needed. for insomnia 01/15/13   [provider]    Allergies: Aimovig [erenumab], Compazine  [prochlorperazine ], Amoxicillin-pot clavulanate, Carbamazepine, and Oxcarbazepine  er    Review of Systems  Constitutional:  Negative  for fever.  Eyes:  Negative for visual disturbance.  Neurological:  Positive for headaches. Negative for dizziness, syncope and weakness.  All other systems reviewed and are negative.   Updated Vital Signs BP 121/62 (BP Location: Right Arm)   Pulse 92   Resp 16   Ht 5' 7 (1.702 m)   Wt 62.6 kg   SpO2 100%   BMI 21.62 kg/m   Physical Exam Constitutional:      Appearance: Normal appearance.  HENT:     Head: Normocephalic and atraumatic.     Nose: Nose normal.     Mouth/Throat:     Mouth: Mucous membranes are moist.     Pharynx: Oropharynx is clear.  Eyes:     Extraocular Movements: Extraocular movements intact.     Pupils: Pupils are equal, round, and reactive to light.  Cardiovascular:     Rate and Rhythm: Normal rate.  Pulmonary:     Effort: Pulmonary effort is normal.  Skin:    General: Skin is warm and dry.  Neurological:     Mental Status: She is alert and oriented to person, place, and time.     Cranial Nerves: No cranial nerve deficit.     Motor: No weakness.  Psychiatric:        Mood and Affect: Mood normal.        Behavior: Behavior normal.     (all labs ordered are listed, but only abnormal results are displayed) Labs Reviewed - No data to display  EKG: None  Radiology: No results found.    Medications Ordered in the ED  ketorolac  (TORADOL ) 15 MG/ML injection 15 mg (has no administration in time range)  metoCLOPramide  (REGLAN ) injection 10 mg (has no administration in time range)  diphenhydrAMINE  (BENADRYL ) injection 25 mg (has no administration in time range)  methylPREDNISolone  sodium succinate (SOLU-MEDROL ) 125 mg/2 mL injection 125 mg (has no administration in time range)  sodium chloride  0.9 % bolus 1,000 mL (has no administration in time range)                                   Medical Decision Making Patient is a 53 year old female with a history of migraines who presents to the ED for increasing migraine headache since 5 AM this  morning.  Please see detailed HPI above.  On exam patient is alert and in no acute distress.  Physical exam as noted above.  No acute neurologic deficits noted.  Differential includes migraine headache, tension headache, intracranial mass, subarachnoid.  No concerns for emergent imaging as patient notes this is very similar to her normal migraines and she has an extensive migraine history.  No acute deficits to suggest any changes at this time.  Patient was given Solu-Medrol , Reglan , Benadryl , and Toradol  while in ED with improvement in symptoms.  States she is feeling much better and ready to go home.  Vital signs stable.  Stable for  discharge.  Symptomatic care including her previously prescribed migraine medications discussed.  Advised to continue following up with her neurologist for further management of continued migraines.  Return precautions provided for worsening symptoms.  Risk Prescription drug management.       Final diagnoses:  None    ED Discharge Orders     None          Amandine Covino S, PA-C 12/24/23 2048    Francesca Elsie CROME, MD 12/24/23 2132

## 2023-12-24 NOTE — Discharge Instructions (Signed)
 Continue to take migraine medications as previously prescribed and follow-up with neurology as needed for further evaluation and management.  Return to ED if any symptoms worsen including severe uncontrollable headache, uncontrolled nausea/vomiting, numbness/tingling/weakness, syncopal episode.

## 2023-12-24 NOTE — ED Triage Notes (Signed)
 Pt arrived via POV c/o recurrent migraine that began this morning at 0530 this morning. Pt reports she needs to get a migraine cocktail before she goes into a crisis.

## 2023-12-25 ENCOUNTER — Emergency Department (HOSPITAL_COMMUNITY)

## 2023-12-25 ENCOUNTER — Encounter (HOSPITAL_COMMUNITY): Payer: Self-pay | Admitting: Emergency Medicine

## 2023-12-25 ENCOUNTER — Emergency Department (HOSPITAL_COMMUNITY)
Admission: EM | Admit: 2023-12-25 | Discharge: 2023-12-25 | Disposition: A | Discharge status: Home / Self Care | Attending: Emergency Medicine | Admitting: Emergency Medicine

## 2023-12-25 ENCOUNTER — Other Ambulatory Visit: Payer: Self-pay

## 2023-12-25 DIAGNOSIS — M5481 Occipital neuralgia: Secondary | ICD-10-CM | POA: Insufficient documentation

## 2023-12-25 DIAGNOSIS — G43109 Migraine with aura, not intractable, without status migrainosus: Secondary | ICD-10-CM | POA: Insufficient documentation

## 2023-12-25 DIAGNOSIS — R519 Headache, unspecified: Secondary | ICD-10-CM | POA: Diagnosis present

## 2023-12-25 MED ORDER — ONDANSETRON HCL 4 MG/2ML IJ SOLN
4.0000 mg | Freq: Once | INTRAMUSCULAR | Status: AC
  Administered 2023-12-25: 17:00:00 4 mg via INTRAVENOUS
  Filled 2023-12-25: qty 2

## 2023-12-25 MED ORDER — DIPHENHYDRAMINE HCL 50 MG/ML IJ SOLN
12.5000 mg | Freq: Once | INTRAMUSCULAR | Status: AC
  Administered 2023-12-25: 14:00:00 12.5 mg via INTRAVENOUS
  Filled 2023-12-25: qty 1

## 2023-12-25 MED ORDER — SODIUM CHLORIDE 0.9 % IV BOLUS
1000.0000 mL | Freq: Once | INTRAVENOUS | Status: AC
  Administered 2023-12-25: 14:00:00 1000 mL via INTRAVENOUS

## 2023-12-25 MED ORDER — KETOROLAC TROMETHAMINE 30 MG/ML IJ SOLN
15.0000 mg | Freq: Once | INTRAMUSCULAR | Status: AC
  Administered 2023-12-25: 14:00:00 15 mg via INTRAVENOUS
  Filled 2023-12-25: qty 1

## 2023-12-25 MED ORDER — METOCLOPRAMIDE HCL 5 MG/ML IJ SOLN
10.0000 mg | Freq: Once | INTRAMUSCULAR | Status: AC
  Administered 2023-12-25: 14:00:00 10 mg via INTRAVENOUS
  Filled 2023-12-25: qty 2

## 2023-12-25 MED ORDER — DIHYDROERGOTAMINE MESYLATE 1 MG/ML IJ SOLN
1.0000 mg | Freq: Once | INTRAMUSCULAR | Status: AC
  Administered 2023-12-25: 16:00:00 1 mg via INTRAVENOUS
  Filled 2023-12-25: qty 1

## 2023-12-25 MED ORDER — HYDROMORPHONE HCL 1 MG/ML IJ SOLN
0.5000 mg | Freq: Once | INTRAMUSCULAR | Status: AC
  Administered 2023-12-25: 17:00:00 0.5 mg via INTRAVENOUS
  Filled 2023-12-25: qty 0.5

## 2023-12-25 MED ORDER — OXYCODONE-ACETAMINOPHEN 5-325 MG PO TABS: 1.0000 | ORAL_TABLET | Freq: Four times a day (QID) | ORAL | 0 refills | Status: AC | PRN

## 2023-12-25 NOTE — ED Provider Notes (Signed)
 [place] EMERGENCY DEPARTMENT AT [facility] Provider Note   CSN: [id number] Arrival date & time: 12/25/23  1257     Patient presents with: Migraine   Martha Aguilar is a 53 y.o. female with a history including migraine headaches, history of a subdural neuralgia which was essentially nearly cured with surgical intervention in 2019, unfortunately has had a recurrence of her occipital pain over the last 1 to 2 weeks.  She also reports weekly migraines which is generally controlled with her home medications including Imitrex , she also uses gabapentin  and has increased her dose from 800 nightly to twice daily dosing yesterday, also gets Botox  injections every 3 months, primary neurologist with [facility].  She was seen here yesterday at which time her headache was relieved with IV fluids and migraine cocktail, she left here with her pain 3 out of 10.  However her pain has escalated again now 10 out of 10, along with photophobia, mild nausea, denies any peripheral weakness, dizziness, other visual changes.  Reach out to her neurologist who advised ED management.   The history is provided by the patient.       Prior to Admission medications  Medication Sig Start Date End Date Taking? Authorizing Provider  oxyCODONE -acetaminophen  (PERCOCET/ROXICET) 5-325 MG tablet Take 1 tablet by mouth every 6 (six) hours as needed for severe pain (pain score 7-10). 12/25/23  Yes Ellar Hakala, Mliss, PA-C  amitriptyline  (ELAVIL ) 75 MG tablet Take 75 mg by mouth at bedtime.    [provider]  clonazePAM (KLONOPIN) 0.5 MG tablet Take 0.5 mg by mouth daily as needed for anxiety.    [provider]  diphenoxylate -atropine  (LOMOTIL ) 2.5-0.025 MG tablet Take 1 tablet by mouth 4 (four) times daily as needed for diarrhea or loose stools. 07/06/23   Antonetta Rollene BRAVO, MD  doxycycline (VIBRA-TABS) 100 MG tablet Take 100 mg by mouth 2 (two) times daily. 10/29/23   [provider]   Eptinezumab -jjmr (VYEPTI ) 100 MG/ML injection Inject into the vein. Every three months.    [provider]  estradiol  (ESTRACE ) 1 MG tablet Take 1 tablet by mouth daily. 12/19/21   [provider]  gabapentin  (NEURONTIN ) 800 MG tablet Take 1 tablet (800 mg total) by mouth 3 (three) times daily. Patient taking differently: Take 800 mg by mouth at bedtime. 06/15/20   Skeet Juliene SAUNDERS, DO  levothyroxine  (SYNTHROID ) 175 MCG tablet Take 175 mcg by mouth every morning. Except does not take on Sunday. 10/21/20   [provider]  linaclotide  (LINZESS ) 145 MCG CAPS capsule Take 1 capsule (145 mcg total) by mouth daily before breakfast. Patient not taking: Reported on 07/06/2023 12/14/22   Castaneda Mayorga, Daniel, MD  ofloxacin (OCUFLOX) 0.3 % ophthalmic solution Place 1 drop into both eyes 4 (four) times daily. 10/29/23   [provider]  OnabotulinumtoxinA  (BOTOX  IJ) Inject into the skin every 3 (three) months. Migraine prn    [provider]  ondansetron  (ZOFRAN -ODT) 4 MG disintegrating tablet Take 4 mg by mouth every 8 (eight) hours as needed. 10/30/20   [provider]  progesterone (PROMETRIUM) 100 MG capsule Take 100 mg by mouth at bedtime. 10/19/23   [provider]  SPRAVATO, 84 MG DOSE, 28 MG/DEVICE SOPK Place 84 mg into both nostrils 2 (two) times a week.    [provider]  SUMAtriptan  (IMITREX ) 100 MG tablet Take 1 tablet (100 mg total) by mouth every 2 (two) hours as needed for migraine. May repeat in 2  hours if headache persists or recurs. 06/13/21   Deneice Wack, PA-C  SUMAtriptan  6 MG/0.5ML SOAJ Inject 6 mg into the skin as needed (Migraine).  07/19/18   [provider]  topiramate  (TOPAMAX ) 50 MG tablet Take 50 mg by mouth daily. 07/18/23   [provider]  traZODone  (DESYREL ) 100 MG tablet TAKE TWO TABLETS BY MOUTH AT BEDTIME AS NEEDED 09/14/23   Bacchus, Gloria Z, FNP  valACYclovir  (VALTREX ) 1000 MG tablet Take 1  tablet (1,000 mg total) by mouth 2 (two) times daily. 05/09/17   Cresenzo-Dishmon, Cathlean, CNM  zolpidem  (AMBIEN ) 10 MG tablet Take 10 mg by mouth at bedtime as needed. for insomnia 01/15/13   [provider]    Allergies: Aimovig [erenumab], Compazine  [prochlorperazine ], Amoxicillin-pot clavulanate, Carbamazepine, and Oxcarbazepine  er    Review of Systems  Constitutional:  Negative for chills and fever.  HENT:  Negative for congestion.   Eyes:  Positive for photophobia.  Respiratory:  Negative for chest tightness and shortness of breath.   Cardiovascular:  Negative for chest pain.  Gastrointestinal:  Positive for nausea. Negative for abdominal pain and vomiting.  Genitourinary: Negative.   Musculoskeletal:  Negative for arthralgias, joint swelling and neck pain.  Skin: Negative.  Negative for rash and wound.  Neurological:  Positive for headaches. Negative for dizziness, weakness, light-headedness and numbness.  Psychiatric/Behavioral: Negative.      Updated Vital Signs BP (!) 153/94   Pulse 85   Temp 97.8 F (36.6 C) (Oral)   Resp 17   SpO2 100%   Physical Exam Vitals and nursing note reviewed.  Constitutional:      Appearance: She is well-developed.     Comments: Uncomfortable appearing  HENT:     Head: Normocephalic and atraumatic.     Comments: No occipital scalp rash, erythema, signs of trauma.    Right Ear: Tympanic membrane normal.     Left Ear: Tympanic membrane normal.  Eyes:     Extraocular Movements: Extraocular movements intact.     Pupils: Pupils are equal, round, and reactive to light.  Cardiovascular:     Rate and Rhythm: Normal rate.     Heart sounds: Normal heart sounds.  Pulmonary:     Effort: Pulmonary effort is normal.  Abdominal:     Palpations: Abdomen is soft.     Tenderness: There is no abdominal tenderness.  Musculoskeletal:        General: Normal range of motion.     Cervical back: Normal range of motion and neck supple.   Lymphadenopathy:     Cervical: No cervical adenopathy.  Skin:    General: Skin is warm and dry.     Findings: No rash.  Neurological:     General: No focal deficit present.     Mental Status: She is alert and oriented to person, place, and time.     GCS: GCS eye subscore is 4. GCS verbal subscore is 5. GCS motor subscore is 6.     Cranial Nerves: No cranial nerve deficit.     Sensory: No sensory deficit.     Coordination: Coordination normal.     Gait: Gait normal.     Deep Tendon Reflexes: Reflexes normal.     Comments: Normal heel-shin, normal rapid alternating movements. Cranial nerves III-XII intact.  No pronator drift.  Psychiatric:        Speech: Speech normal.        Behavior: Behavior normal.        Thought Content:  Thought content normal.     (all labs ordered are listed, but only abnormal results are displayed) Labs Reviewed - No data to display  EKG: None  Radiology: CT Head Wo Contrast Result Date: 12/25/2023 CLINICAL DATA:  Headache.  History of migraine headaches. EXAM: CT HEAD WITHOUT CONTRAST TECHNIQUE: Contiguous axial images were obtained from the base of the skull through the vertex without intravenous contrast. RADIATION DOSE REDUCTION: This exam was performed according to the departmental dose-optimization program which includes automated exposure control, adjustment of the mA and/or kV according to patient size and/or use of iterative reconstruction technique. COMPARISON:  MRI brain 04/20/2017 FINDINGS: Brain: Ventricles, cisterns and other CSF spaces are normal. No mass, mass effect, shift of midline structures or acute hemorrhage. No acute fracture. Vascular: No hyperdense vessel or unexpected calcification. Skull: Normal. Negative for fracture or focal lesion. Sinuses/Orbits: Orbits are normal symmetric. Paranasal sinuses are clear. Other: Several metallic densities over the subcutaneous tissues of the bifrontal scalp with attached leads extending posteriorly  and inferiorly in the subcutaneous fat to the occipital region likely related to patient's migraine treatments. IMPRESSION: No acute findings. Electronically Signed   By: Toribio Agreste M.D.   On: 12/25/2023 15:21     Procedures   Medications Ordered in the ED  sodium chloride  0.9 % bolus 1,000 mL (1,000 mLs Intravenous New Bag/Given 12/25/23 1422)  metoCLOPramide  (REGLAN ) injection 10 mg (10 mg Intravenous Given 12/25/23 1422)  diphenhydrAMINE  (BENADRYL ) injection 12.5 mg (12.5 mg Intravenous Given 12/25/23 1419)  ketorolac  (TORADOL ) 30 MG/ML injection 15 mg (15 mg Intravenous Given 12/25/23 1420)  dihydroergotamine  (DHE) injection 1 mg (1 mg Intravenous Given 12/25/23 1552)  HYDROmorphone  (DILAUDID ) injection 0.5 mg (0.5 mg Intravenous Given 12/25/23 1634)  ondansetron  (ZOFRAN ) injection 4 mg (4 mg Intravenous Given 12/25/23 1633)    Clinical Course as of 12/25/23 1744  Tue Dec 25, 2023  1501 Migraine reduced to 5/10 with migraine cocktail.  Posterior head burning unchanged.  DHE ordered [JI]    Clinical Course User Index [JI] Birdena Mliss RIGGERS                                 Medical Decision Making Pt presenting with persistent migraine since ytd,  with history of sig migraines, weekly, now with return of her posterior headache similar to her occipital neuralgia which had been resolved for years after surgical intervention.  No focal deficits on exam.  She was given IV fluids and migraine cocktail including toradol ,  benadryl  and reglan  - 5/10 migraine,  posterior headache unchanged.  Added DHE, pt endorsing feeling flushed,  nausea and no improvement in headache.  CT imaging negative,  added dilaudid  and zofran  since no other tx have been effective.  If sx improve, will consider dc home with short course of oxycodone ,  close f/u with her neurologist and/or pcp   Pt reassessed after dilaudid ,  migraine and occipital pain nearly resolved.  Pt will be sent home with short course of  oxycodone , encouraged to continue her other home meds including increased dosing of her gabapentin .  Advised close f/u with her neurolgist.    Amount and/or Complexity of Data Reviewed Radiology: ordered.    Details: CT head reviewed,  no acute intracranial findings.   Risk Prescription drug management.        Final diagnoses:  Migraine with aura and without status migrainosus, not intractable  Occipital neuralgia, unspecified laterality  ED Discharge Orders          Ordered    oxyCODONE -acetaminophen  (PERCOCET/ROXICET) 5-325 MG tablet  Every 6 hours PRN        12/25/23 1744               Charlene Detter, PA-C 12/25/23 1745

## 2023-12-25 NOTE — ED Triage Notes (Signed)
 Pt c/o continued migraine, seen here yesterday for the same.

## 2024-01-07 ENCOUNTER — Telehealth: Payer: Self-pay

## 2024-01-07 NOTE — Telephone Encounter (Signed)
 Copied from CRM #[id number]. Topic: Appointments - Scheduling Inquiry for Clinic >> Jan 04, 2024  2:24 PM Berwyn MATSU wrote: Reason for CRM:   Patient called in to advise that she would like to schedule an appointment with [facility] SILK .   May you please assist.

## 2024-01-22 ENCOUNTER — Telehealth: Payer: Self-pay

## 2024-01-22 NOTE — Telephone Encounter (Signed)
 Copied from CRM #[id number]. Topic: General - Other >> Jan 21, 2024  4:50 PM Joesph NOVAK wrote: Reason for CRM: patient calling to r/s appt with brian franco, please call pt to reschedule

## 2024-01-22 NOTE — Telephone Encounter (Signed)
 Called patient left voicemail to call office to rescehdule

## 2024-01-25 ENCOUNTER — Ambulatory Visit: Payer: Self-pay | Admitting: Professional Counselor

## 2024-01-25 DIAGNOSIS — F322 Major depressive disorder, single episode, severe without psychotic features: Secondary | ICD-10-CM
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
## 2024-02-01 ENCOUNTER — Ambulatory Visit: Payer: Self-pay | Admitting: Professional Counselor

## 2024-02-01 DIAGNOSIS — F322 Major depressive disorder, single episode, severe without psychotic features: Secondary | ICD-10-CM

## 2024-02-01 NOTE — BH Specialist Note (Signed)
 [facility] Follow Up  MRN: [id number] NAME: ZAYAH KEILMAN Date: 02/01/24  Start time: Start Time: 0100 End time: Stop Time: 0130 Total time: Total Time in Minutes (Visit): 30 Call number: Visit Number: 2- Second Visit  Reason for call today:  Patient is a 54 year old female presenting for a collaborative care follow-up. She presents to session with a relatively positive mood, though she continues to experience significant stress related to her teenage daughters ongoing mental health concerns. Patient reports her daughter is currently in an inpatient psychiatric facility, and she describes the past year as particularly difficult due to persistent behavioral issues, which have also triggered resurfacing of her own past experiences, depression, and anxiety symptoms.  Despite ongoing emotional distress, patient reports she has remained able to work and function in her daily roles, though she acknowledges feeling emotionally overwhelmed at times. Supportive counseling was provided during the session, and patient expressed some increased hopefulness but continues to feel she may benefit from medication adjustments.  Patient reports prior psychiatric care through [facility], where she had been prescribed Klonopin and Suboxone. She reports being informed that Suboxone was prescribed for depression. This raised clinical concern, as Suboxone is not a standard treatment for depressive disorders. Patient reports she has not taken Suboxone in several weeks and denies withdrawal symptoms. She also reports she was at risk of dismissal from that practice after missing a follow-up appointment.  A psychiatric consultation was completed as part of collaborative care. The consulting psychiatrist shared concerns regarding the appropriateness of the prior medication regimen and agreed that a comprehensive psychiatric evaluation would be beneficial. It was recommended that the patient establish care with a new  psychiatric provider, potentially through [facility] or another outpatient psychiatric practice, to reassess diagnosis and medication needs.  Plan: Refer patient for psychiatric evaluation with an alternative provider for medication management. Continue collaborative care follow-up with ongoing supportive counseling to assist the patient in managing stress, anxiety, and depressive symptoms related to ongoing family stressors.   PHQ-9 Scores:     02/01/2024    1:27 PM 01/25/2024    1:17 PM 07/06/2023    4:03 PM 11/24/2022    1:08 PM 07/06/2022    2:31 PM  Depression screen PHQ 2/9  Decreased Interest 1 2 3 2  0  Down, Depressed, Hopeless 2 2 3 2    PHQ - 2 Score 3 4 6 4  0  Altered sleeping 3 3 3 3    Tired, decreased energy 1 2 3 2    Change in appetite 1 2 3 2    Feeling bad or failure about yourself  0 1 3 2    Trouble concentrating 1 1 3 2    Moving slowly or fidgety/restless 1 1 3 1    Suicidal thoughts 0 0 0 0   PHQ-9 Score 10 14 24  16     Difficult doing work/chores  Somewhat difficult Extremely dIfficult Very difficult      Data saved with a previous flowsheet row definition   GAD-7 Scores:     02/01/2024    1:29 PM 01/25/2024    1:17 PM 07/06/2023    4:00 PM 11/24/2022    1:09 PM  GAD 7 : Generalized Anxiety Score  Nervous, Anxious, on Edge 1 2  2  2    Control/stop worrying 3 3  3  3    Worry too much - different things 3 3  3  3    Trouble relaxing 2 2  3  3    Restless  1 0  0  2   Easily annoyed or irritable 1 0  2  2   Afraid - awful might happen 1 0  2  3   Total GAD 7 Score 12 10 15 18   Anxiety Difficulty Somewhat difficult Somewhat difficult Somewhat difficult Not difficult at all     Data saved with a previous flowsheet row definition    Stress Current stressors:  Daughter  Sleep:  insonmia takes medication Appetite:  Cyndie Coping ability:  fair Patient taking medications as prescribed:  yes  Current medications:  Outpatient Encounter Medications as of 02/01/2024   Medication Sig   amitriptyline  (ELAVIL ) 75 MG tablet Take 75 mg by mouth at bedtime.   clonazePAM (KLONOPIN) 0.5 MG tablet Take 0.5 mg by mouth daily as needed for anxiety.   diphenoxylate -atropine  (LOMOTIL ) 2.5-0.025 MG tablet Take 1 tablet by mouth 4 (four) times daily as needed for diarrhea or loose stools.   doxycycline (VIBRA-TABS) 100 MG tablet Take 100 mg by mouth 2 (two) times daily.   Eptinezumab -jjmr (VYEPTI ) 100 MG/ML injection Inject into the vein. Every three months.   estradiol  (ESTRACE ) 1 MG tablet Take 1 tablet by mouth daily.   gabapentin  (NEURONTIN ) 800 MG tablet Take 1 tablet (800 mg total) by mouth 3 (three) times daily. (Patient taking differently: Take 800 mg by mouth at bedtime.)   levothyroxine  (SYNTHROID ) 175 MCG tablet Take 175 mcg by mouth every morning. Except does not take on Sunday.   linaclotide  (LINZESS ) 145 MCG CAPS capsule Take 1 capsule (145 mcg total) by mouth daily before breakfast. (Patient not taking: Reported on 07/06/2023)   ofloxacin (OCUFLOX) 0.3 % ophthalmic solution Place 1 drop into both eyes 4 (four) times daily.   OnabotulinumtoxinA  (BOTOX  IJ) Inject into the skin every 3 (three) months. Migraine prn   ondansetron  (ZOFRAN -ODT) 4 MG disintegrating tablet Take 4 mg by mouth every 8 (eight) hours as needed.   oxyCODONE -acetaminophen  (PERCOCET/ROXICET) 5-325 MG tablet Take 1 tablet by mouth every 6 (six) hours as needed for severe pain (pain score 7-10).   progesterone (PROMETRIUM) 100 MG capsule Take 100 mg by mouth at bedtime.   SPRAVATO, 84 MG DOSE, 28 MG/DEVICE SOPK Place 84 mg into both nostrils 2 (two) times a week.   SUMAtriptan  (IMITREX ) 100 MG tablet Take 1 tablet (100 mg total) by mouth every 2 (two) hours as needed for migraine. May repeat in 2 hours if headache persists or recurs.   SUMAtriptan  6 MG/0.5ML SOAJ Inject 6 mg into the skin as needed (Migraine).    topiramate  (TOPAMAX ) 50 MG tablet Take 50 mg by mouth daily.   traZODone  (DESYREL )  100 MG tablet TAKE TWO TABLETS BY MOUTH AT BEDTIME AS NEEDED   valACYclovir  (VALTREX ) 1000 MG tablet Take 1 tablet (1,000 mg total) by mouth 2 (two) times daily.   zolpidem  (AMBIEN ) 10 MG tablet Take 10 mg by mouth at bedtime as needed. for insomnia   No facility-administered encounter medications on file as of 02/01/2024.   Self-harm Behaviors Risk Assessment Self-harm risk factors:  depression, stress, ptsd, Patient endorses recent thoughts of harming self:    [place] Suicide Severity Rating Scale:  Flowsheet Row ED from 12/25/2023 in [facility] Emergency Department at [facility] ED from 12/24/2023 in [facility] ED from 11/02/2023 in [facility] Emergency Department at [facility]  C-SSRS RISK CATEGORY No Risk No Risk No Risk     Danger to Others  Risk Assessment Danger to others risk factors:  None Patient endorses recent thoughts of harming others:  None   Substance Use Assessment Patient recently consumed alcohol:  1-2 glasses a night  Alcohol Use Disorder Identification Test (AUDIT):      No data to display         Patient recently used drugs:    Withdrawal Risk Assessment: moderate   Goals, Interventions and Follow-up Plan Goals: Increase healthy adjustment to current life circumstances Interventions: Supportive Counseling Follow-up Plan: Refer to Psychiatrist for Medication Management    [name]

## 2024-02-01 NOTE — Patient Instructions (Signed)
 If your symptoms worsen or you have thoughts of suicide/homicide, PLEASE SEEK IMMEDIATE MEDICAL ATTENTION.  You may always call:   National Suicide Hotline: 988 or 539 667 3577 [place] Crisis Line: 458 569 7915 Crisis Recovery in [place]: [phone]     These are available 24 hours a day, 7 days a week.

## 2024-02-08 ENCOUNTER — Telehealth: Payer: Self-pay | Admitting: Professional Counselor

## 2024-02-08 ENCOUNTER — Ambulatory Visit: Payer: Self-pay | Admitting: Professional Counselor

## 2024-02-08 DIAGNOSIS — F322 Major depressive disorder, single episode, severe without psychotic features: Secondary | ICD-10-CM
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
## 2024-02-15 ENCOUNTER — Ambulatory Visit: Admitting: Professional Counselor

## 2024-02-15 NOTE — BH Specialist Note (Unsigned)
 [facility] Virtual BH Telephone Follow-up  MRN: [id number] NAME: Martha Aguilar Date: 02/15/24  Start time: Start Time: 0100 End time: Stop Time: 0130 Total time: Total Time in Minutes (Visit): 30 Call number: Visit Number: 4- Fourth Visit  Reason for call today:  Hasn't been able to see her daughter for 7 weeks and its really impacting her.   PHQ-9 Scores:     02/01/2024    1:27 PM 01/25/2024    1:17 PM 07/06/2023    4:03 PM 11/24/2022    1:08 PM 07/06/2022    2:31 PM  Depression screen PHQ 2/9  Decreased Interest 1 2 3 2  0  Down, Depressed, Hopeless 2 2 3 2    PHQ - 2 Score 3 4 6 4  0  Altered sleeping 3 3 3 3    Tired, decreased energy 1 2 3 2    Change in appetite 1 2 3 2    Feeling bad or failure about yourself  0 1 3 2    Trouble concentrating 1 1 3 2    Moving slowly or fidgety/restless 1 1 3 1    Suicidal thoughts 0 0 0 0   PHQ-9 Score 10 14 24  16     Difficult doing work/chores  Somewhat difficult Extremely dIfficult Very difficult      Data saved with a previous flowsheet row definition   GAD-7 Scores:     02/01/2024    1:29 PM 01/25/2024    1:17 PM 07/06/2023    4:00 PM 11/24/2022    1:09 PM  GAD 7 : Generalized Anxiety Score  Nervous, Anxious, on Edge 1 2  2  2    Control/stop worrying 3 3  3  3    Worry too much - different things 3 3  3  3    Trouble relaxing 2 2  3  3    Restless 1 0  0  2   Easily annoyed or irritable 1 0  2  2   Afraid - awful might happen 1 0  2  3   Total GAD 7 Score 12 10 15 18   Anxiety Difficulty Somewhat difficult Somewhat difficult Somewhat difficult Not difficult at all     Data saved with a previous flowsheet row definition    Stress Current stressors:  Daughter's well being Sleep:  improved Appetite:  fine Coping ability:  fair Patient taking medications as prescribed:  yes  Current medications:  Outpatient Encounter Medications as of 02/15/2024  Medication Sig   amitriptyline  (ELAVIL ) 75 MG tablet Take 75 mg by mouth at  bedtime.   clonazePAM (KLONOPIN) 0.5 MG tablet Take 0.5 mg by mouth daily as needed for anxiety.   diphenoxylate -atropine  (LOMOTIL ) 2.5-0.025 MG tablet Take 1 tablet by mouth 4 (four) times daily as needed for diarrhea or loose stools.   doxycycline (VIBRA-TABS) 100 MG tablet Take 100 mg by mouth 2 (two) times daily.   Eptinezumab -jjmr (VYEPTI ) 100 MG/ML injection Inject into the vein. Every three months.   estradiol  (ESTRACE ) 1 MG tablet Take 1 tablet by mouth daily.   gabapentin  (NEURONTIN ) 800 MG tablet Take 1 tablet (800 mg total) by mouth 3 (three) times daily. (Patient taking differently: Take 800 mg by mouth at bedtime.)   levothyroxine  (SYNTHROID ) 175 MCG tablet Take 175 mcg by mouth every morning. Except does not take on Sunday.   linaclotide  (LINZESS ) 145 MCG CAPS capsule Take 1 capsule (145 mcg total) by mouth daily before breakfast. (Patient not taking: Reported on 07/06/2023)   ofloxacin (OCUFLOX)  0.3 % ophthalmic solution Place 1 drop into both eyes 4 (four) times daily.   OnabotulinumtoxinA  (BOTOX  IJ) Inject into the skin every 3 (three) months. Migraine prn   ondansetron  (ZOFRAN -ODT) 4 MG disintegrating tablet Take 4 mg by mouth every 8 (eight) hours as needed.   oxyCODONE -acetaminophen  (PERCOCET/ROXICET) 5-325 MG tablet Take 1 tablet by mouth every 6 (six) hours as needed for severe pain (pain score 7-10).   progesterone (PROMETRIUM) 100 MG capsule Take 100 mg by mouth at bedtime.   SPRAVATO, 84 MG DOSE, 28 MG/DEVICE SOPK Place 84 mg into both nostrils 2 (two) times a week.   SUMAtriptan  (IMITREX ) 100 MG tablet Take 1 tablet (100 mg total) by mouth every 2 (two) hours as needed for migraine. May repeat in 2 hours if headache persists or recurs.   SUMAtriptan  6 MG/0.5ML SOAJ Inject 6 mg into the skin as needed (Migraine).    topiramate  (TOPAMAX ) 50 MG tablet Take 50 mg by mouth daily.   traZODone  (DESYREL ) 100 MG tablet TAKE TWO TABLETS BY MOUTH AT BEDTIME AS NEEDED   valACYclovir   (VALTREX ) 1000 MG tablet Take 1 tablet (1,000 mg total) by mouth 2 (two) times daily.   zolpidem  (AMBIEN ) 10 MG tablet Take 10 mg by mouth at bedtime as needed. for insomnia   No facility-administered encounter medications on file as of 02/15/2024.     Self-harm Behaviors Risk Assessment Self-harm risk factors:   Patient endorses recent thoughts of harming self:    [place] Suicide Severity Rating Scale:  Danger to Others Risk Assessment Danger to others risk factors:   Patient endorses recent thoughts of harming others:    Dynamic Appraisal of Situational Aggression (DASA):      No data to display           Substance Use Assessment Patient recently consumed alcohol:    Alcohol Use Disorder Identification Test (AUDIT):      No data to display         Patient recently used drugs:    Opioid Risk Assessment:    Goals, Interventions and Follow-up Plan Goals: {IBH Goals:21014053} Interventions: {IBH Interventions:21014054} Follow-up Plan: {Virtual BH Follow up Recommendations:21014064}  Summary: ***  Martha Aguilar

## 2024-02-22 ENCOUNTER — Ambulatory Visit: Payer: Self-pay | Admitting: Professional Counselor
